# Patient Record
Sex: Female | Born: 1955
Health system: Southern US, Community
[De-identification: ages and names within clinical notes are randomized; demographics above are authoritative.]

## PROBLEM LIST (undated history)

## (undated) DIAGNOSIS — Z83719 Family history of colon polyps, unspecified: Secondary | ICD-10-CM

## (undated) DIAGNOSIS — Z860100 Personal history of colon polyps, unspecified: Secondary | ICD-10-CM

## (undated) DIAGNOSIS — J302 Other seasonal allergic rhinitis: Secondary | ICD-10-CM

## (undated) DIAGNOSIS — Z8619 Personal history of other infectious and parasitic diseases: Secondary | ICD-10-CM

## (undated) DIAGNOSIS — G43909 Migraine, unspecified, not intractable, without status migrainosus: Secondary | ICD-10-CM

## (undated) DIAGNOSIS — M109 Gout, unspecified: Secondary | ICD-10-CM

## (undated) DIAGNOSIS — Z87442 Personal history of urinary calculi: Secondary | ICD-10-CM

## (undated) DIAGNOSIS — C4490 Unspecified malignant neoplasm of skin, unspecified: Secondary | ICD-10-CM

## (undated) DIAGNOSIS — K219 Gastro-esophageal reflux disease without esophagitis: Secondary | ICD-10-CM

## (undated) DIAGNOSIS — Z853 Personal history of malignant neoplasm of breast: Secondary | ICD-10-CM

## (undated) DIAGNOSIS — Z8371 Family history of colonic polyps: Secondary | ICD-10-CM

## (undated) DIAGNOSIS — J189 Pneumonia, unspecified organism: Secondary | ICD-10-CM

## (undated) DIAGNOSIS — N390 Urinary tract infection, site not specified: Secondary | ICD-10-CM

## (undated) DIAGNOSIS — F419 Anxiety disorder, unspecified: Secondary | ICD-10-CM

## (undated) DIAGNOSIS — L719 Rosacea, unspecified: Secondary | ICD-10-CM

## (undated) DIAGNOSIS — R58 Hemorrhage, not elsewhere classified: Secondary | ICD-10-CM

## (undated) DIAGNOSIS — K279 Peptic ulcer, site unspecified, unspecified as acute or chronic, without hemorrhage or perforation: Secondary | ICD-10-CM

## (undated) DIAGNOSIS — M1990 Unspecified osteoarthritis, unspecified site: Secondary | ICD-10-CM

## (undated) DIAGNOSIS — K760 Fatty (change of) liver, not elsewhere classified: Secondary | ICD-10-CM

## (undated) DIAGNOSIS — I10 Essential (primary) hypertension: Secondary | ICD-10-CM

## (undated) DIAGNOSIS — H04129 Dry eye syndrome of unspecified lacrimal gland: Secondary | ICD-10-CM

## (undated) DIAGNOSIS — J45909 Unspecified asthma, uncomplicated: Secondary | ICD-10-CM

## (undated) DIAGNOSIS — K317 Polyp of stomach and duodenum: Secondary | ICD-10-CM

## (undated) DIAGNOSIS — Z800 Family history of malignant neoplasm of digestive organs: Secondary | ICD-10-CM

## (undated) DIAGNOSIS — K589 Irritable bowel syndrome without diarrhea: Secondary | ICD-10-CM

## (undated) DIAGNOSIS — J309 Allergic rhinitis, unspecified: Secondary | ICD-10-CM

## (undated) DIAGNOSIS — N189 Chronic kidney disease, unspecified: Secondary | ICD-10-CM

## (undated) HISTORY — PX: WISDOM TOOTH EXTRACTION: SHX21

## (undated) HISTORY — DX: Dry eye syndrome of unspecified lacrimal gland: H04.129

## (undated) HISTORY — PX: KNEE SURGERY: SHX244

## (undated) HISTORY — DX: Irritable bowel syndrome, unspecified: K58.9

## (undated) HISTORY — DX: Other seasonal allergic rhinitis: J30.2

## (undated) HISTORY — DX: Hemorrhage, not elsewhere classified: R58

## (undated) HISTORY — DX: Urinary tract infection, site not specified: N39.0

## (undated) HISTORY — DX: Family history of malignant neoplasm of digestive organs: Z80.0

## (undated) HISTORY — PX: CHOLECYSTECTOMY: SHX55

## (undated) HISTORY — DX: Personal history of urinary calculi: Z87.442

## (undated) HISTORY — DX: Family history of colon polyps, unspecified: Z83.719

## (undated) HISTORY — DX: Unspecified asthma, uncomplicated: J45.909

## (undated) HISTORY — DX: Gastro-esophageal reflux disease without esophagitis: K21.9

## (undated) HISTORY — DX: Personal history of other infectious and parasitic diseases: Z86.19

## (undated) HISTORY — DX: Unspecified malignant neoplasm of skin, unspecified: C44.90

## (undated) HISTORY — DX: Chronic kidney disease, unspecified: N18.9

## (undated) HISTORY — DX: Gout, unspecified: M10.9

## (undated) HISTORY — DX: Peptic ulcer, site unspecified, unspecified as acute or chronic, without hemorrhage or perforation: K27.9

## (undated) HISTORY — DX: Rosacea, unspecified: L71.9

## (undated) HISTORY — PX: CATARACT EXTRACTION: SUR2

## (undated) HISTORY — DX: Personal history of colonic polyps: Z86.010

## (undated) HISTORY — DX: Personal history of malignant neoplasm of breast: Z85.3

## (undated) HISTORY — DX: Anxiety disorder, unspecified: F41.9

## (undated) HISTORY — DX: Migraine, unspecified, not intractable, without status migrainosus: G43.909

## (undated) HISTORY — DX: Essential (primary) hypertension: I10

## (undated) HISTORY — DX: Pneumonia, unspecified organism: J18.9

## (undated) HISTORY — DX: Family history of colonic polyps: Z83.71

## (undated) HISTORY — DX: Allergic rhinitis, unspecified: J30.9

## (undated) HISTORY — PX: ABDOMINAL HYSTERECTOMY: SUR658

## (undated) HISTORY — DX: Polyp of stomach and duodenum: K31.7

## (undated) HISTORY — DX: Personal history of colon polyps, unspecified: Z86.0100

## (undated) HISTORY — DX: Unspecified osteoarthritis, unspecified site: M19.90

## (undated) HISTORY — DX: Fatty (change of) liver, not elsewhere classified: K76.0

---

## 1974-02-04 HISTORY — PX: NOSE SURGERY: SHX723

## 2008-02-05 DIAGNOSIS — C50919 Malignant neoplasm of unspecified site of unspecified female breast: Secondary | ICD-10-CM

## 2008-02-05 HISTORY — DX: Malignant neoplasm of unspecified site of unspecified female breast: C50.919

## 2009-01-30 HISTORY — PX: BREAST LUMPECTOMY: SHX2

## 2009-12-04 HISTORY — PX: LAPAROSCOPIC HYSTERECTOMY: SHX1926

## 2010-02-04 HISTORY — PX: COLONOSCOPY: SHX174

## 2012-03-27 DIAGNOSIS — C50919 Malignant neoplasm of unspecified site of unspecified female breast: Secondary | ICD-10-CM | POA: Insufficient documentation

## 2012-03-27 DIAGNOSIS — K219 Gastro-esophageal reflux disease without esophagitis: Secondary | ICD-10-CM | POA: Insufficient documentation

## 2012-11-11 DIAGNOSIS — M109 Gout, unspecified: Secondary | ICD-10-CM | POA: Insufficient documentation

## 2012-11-11 DIAGNOSIS — J45909 Unspecified asthma, uncomplicated: Secondary | ICD-10-CM | POA: Insufficient documentation

## 2012-11-11 DIAGNOSIS — R002 Palpitations: Secondary | ICD-10-CM | POA: Insufficient documentation

## 2013-05-10 HISTORY — PX: ESOPHAGOGASTRODUODENOSCOPY: SHX1529

## 2014-01-18 DIAGNOSIS — R112 Nausea with vomiting, unspecified: Secondary | ICD-10-CM | POA: Insufficient documentation

## 2014-01-18 DIAGNOSIS — Z9889 Other specified postprocedural states: Secondary | ICD-10-CM | POA: Insufficient documentation

## 2014-07-14 DIAGNOSIS — R079 Chest pain, unspecified: Secondary | ICD-10-CM | POA: Insufficient documentation

## 2014-10-08 DIAGNOSIS — Z8709 Personal history of other diseases of the respiratory system: Secondary | ICD-10-CM

## 2014-10-08 DIAGNOSIS — J449 Chronic obstructive pulmonary disease, unspecified: Secondary | ICD-10-CM | POA: Insufficient documentation

## 2014-10-08 DIAGNOSIS — I10 Essential (primary) hypertension: Secondary | ICD-10-CM

## 2014-10-08 DIAGNOSIS — Z853 Personal history of malignant neoplasm of breast: Secondary | ICD-10-CM | POA: Insufficient documentation

## 2014-10-08 DIAGNOSIS — Z8719 Personal history of other diseases of the digestive system: Secondary | ICD-10-CM | POA: Insufficient documentation

## 2015-01-16 ENCOUNTER — Ambulatory Visit: Payer: Self-pay | Admitting: Allergy and Immunology

## 2015-01-20 ENCOUNTER — Encounter: Payer: Self-pay | Admitting: Allergy and Immunology

## 2015-01-20 ENCOUNTER — Ambulatory Visit (INDEPENDENT_AMBULATORY_CARE_PROVIDER_SITE_OTHER): Payer: 59 | Admitting: Allergy and Immunology

## 2015-01-20 VITALS — BP 124/96 | HR 88 | Resp 16

## 2015-01-20 DIAGNOSIS — Z8709 Personal history of other diseases of the respiratory system: Secondary | ICD-10-CM

## 2015-01-20 DIAGNOSIS — J449 Chronic obstructive pulmonary disease, unspecified: Secondary | ICD-10-CM

## 2015-01-20 DIAGNOSIS — J45909 Unspecified asthma, uncomplicated: Secondary | ICD-10-CM

## 2015-01-20 DIAGNOSIS — Z8719 Personal history of other diseases of the digestive system: Secondary | ICD-10-CM | POA: Diagnosis not present

## 2015-01-20 MED ORDER — MONTELUKAST SODIUM 10 MG PO TABS: 10.0000 mg | ORAL_TABLET | Freq: Every day | ORAL | Status: DC

## 2015-01-20 NOTE — Progress Notes (Signed)
[facility]  Follow-up Note  Refering Provider: Ernestene Kiel, MD Primary Provider: Ernestene Kiel, MD  Subjective:   Autumn Carter is a 59 y.o. female who returns to the [facility] [place] in re-evaluation of the following:  HPI Comments:  Autumn Carter presents to this clinic on 01/20/2015 in evaluation of her respiratory tract disease. Overall she's done relatively well since I last seen her in July. She has not had an exacerbation of her asthma requiring her to receive a systemic steroid. She continues to use Asmanex 220 one inhalation twice a day and rarely uses a short acting bronchodilator. She doesn't exercise at all and thus it's difficult to tell whether or not she has a decreased exercise capacity. She was apparently hospitalized in November for chest pain evaluation which was negative. She had a CT scan of her chest which identified a hiatal hernia. No other significant abnormality of her respiratory tract be defined with that study. Her nose was doing relatively well on Rhinocort one spray shot also once a day and her reflux was doing very good on Dexilant 60 mg one tablet once a day but occasionally 1 tablet every other day.  Yesterday she took a dose of nifedipine as treatment for her systemic arterial hypertension and developed cramping of her abdomen, bed reflux, eye irritation, and headache. She will not be taking another nifedipine at this point. She does have significant hypertension and is on several medications for this condition at this point in time and has had lots of different side effects from medications administered in the past.   Current Outpatient Prescriptions on File Prior to Visit  Medication Sig Dispense Refill  . Albuterol Sulfate (PROAIR HFA IN) Inhale into the lungs as needed.    Marland Kitchen dexlansoprazole (DEXILANT) 60 MG capsule Take 60 mg by mouth daily.    . diazepam (VALIUM) 5 MG tablet Take 5 mg by  mouth every 6 (six) hours as needed for anxiety.    . diclofenac sodium (VOLTAREN) 1 % GEL Apply topically 4 (four) times daily as needed.    Marland Kitchen losartan (COZAAR) 50 MG tablet Take 50 mg by mouth daily.    . mometasone (ASMANEX 120 METERED DOSES) 220 MCG/INH inhaler Inhale 1 puff into the lungs daily.    . montelukast (SINGULAIR) 10 MG tablet Take 10 mg by mouth at bedtime.    . potassium chloride (KLOR-CON M10) 10 MEQ tablet Take 10 mEq by mouth 2 (two) times daily.    . Probiotic Product (PROBIOTIC PO) Take by mouth daily.    Marland Kitchen tiZANidine (ZANAFLEX) 2 MG tablet Take 2 mg by mouth every 8 (eight) hours as needed for muscle spasms.    . traMADol (ULTRAM) 50 MG tablet Take 50 mg by mouth every 6 (six) hours as needed.    . indapamide (LOZOL) 1.25 MG tablet Take 1.25 mg by mouth daily. Reported on 01/20/2015     No current facility-administered medications on file prior to visit.    No orders of the defined types were placed in this encounter.    Past Medical History  Diagnosis Date  . Asthma     History reviewed. No pertinent past surgical history.  Allergies  Allergen Reactions  . Ace Inhibitors Swelling  . Amoxicillin   . Benicar Hct [Olmesartan Medoxomil-Hctz]   . Cephalexin   . Codeine Sulfate   . Hydralazine Hcl   . Hydrochlorothiazide   . Lansoprazole   .  Levaquin [Levofloxacin In D5w]   . Macrobid [employer] Macro]   . Metoprolol Tartrate   . Morphine And Related Other (See Comments)    hallucinations    . Nasonex [Mometasone Furoate]   . Ranitidine Itching and Swelling  . Sulfa Antibiotics   . Zestril [Lisinopril]     Review of Systems  Constitutional: Negative.   HENT: Positive for congestion.   Eyes: Positive for itching.  Respiratory: Negative.   Cardiovascular: Negative.   Gastrointestinal: Negative.   Musculoskeletal: Positive for arthralgias (Knee arthritis).  Skin: Negative.   Hematological: Negative.      Objective:   Filed  Vitals:   01/20/15 1114  BP: 124/96  Pulse: 88  Resp: 16          Physical Exam  Constitutional: She appears well-developed and well-nourished. No distress.  HENT:  Head: Normocephalic and atraumatic. Head is without right periorbital erythema and without left periorbital erythema.  Right Ear: Tympanic membrane, external ear and ear canal normal. No drainage or tenderness. No foreign bodies. Tympanic membrane is not injected, not scarred, not perforated, not erythematous, not retracted and not bulging. No middle ear effusion.  Left Ear: Tympanic membrane, external ear and ear canal normal. No drainage or tenderness. No foreign bodies. Tympanic membrane is not injected, not scarred, not perforated, not erythematous, not retracted and not bulging.  No middle ear effusion.  Nose: Nose normal. No mucosal edema, rhinorrhea, nose lacerations or sinus tenderness.  No foreign bodies.  Mouth/Throat: Oropharynx is clear and moist. No oropharyngeal exudate, posterior oropharyngeal edema, posterior oropharyngeal erythema or tonsillar abscesses.  Eyes: Lids are normal. Right eye exhibits no chemosis, no discharge and no exudate. No foreign body present in the right eye. Left eye exhibits no chemosis, no discharge and no exudate. No foreign body present in the left eye. Right conjunctiva is not injected. Left conjunctiva is not injected.  Neck: Neck supple. No tracheal tenderness present. No tracheal deviation and no edema present. No thyroid mass and no thyromegaly present.  Cardiovascular: Normal rate, regular rhythm, S1 normal and S2 normal.  Exam reveals no gallop.   No murmur heard. Pulmonary/Chest: No accessory muscle usage or stridor. No respiratory distress. She has no wheezes. She has no rhonchi. She has no rales.  Abdominal: Soft.  Lymphadenopathy:       Head (right side): No tonsillar adenopathy present.       Head (left side): No tonsillar adenopathy present.    She has no cervical  adenopathy.  Neurological: She is alert.  Skin: No rash noted. She is not diaphoretic.  Psychiatric: She has a normal mood and affect. Her behavior is normal.    Diagnostics:    Spirometry was performed and demonstrated an FEV1 of 1.39 at 60 % of predicted.  The patient had an Asthma Control Test with the following results: ACT Total Score: 15.    Assessment and Plan:   1. COPD with asthma ([place])   2. History of allergic rhinitis   3. History of gastroesophageal reflux (GERD)      1. Treat inflammation:   A. Asmanex 220 2 inhalations one time per day  B. OTC Rhinocort one spray each nostril one time per day  C. montelukast 10 mg one tablet one time per day  2. Treat reflux:   A. minimize all caffeine and chocolate use  B. continue Dexilant 60 mg in the morning  C. can add ranitidine 300 mg in the evening if needed  3. Continue ProAir HFA and antihistamine if needed  4. Stop nifedipine & undergo progressive exercise routine & check blood pressure  5. Return to clinic in 6 months or earlier if problem  This is been doing relatively well and I see no need for changing her anti-inflammatory medications other than to use a combination of Asmanex and Rhinocort and montelukast on a consistent basis. I would like for her to be a little more aggressive about treating her reflux especially with her recent chest pain admission. I would like for her to minimize off her caffeine and chocolate and consistently use Dexilant every day and she can add in ranitidine if she needs to. Regarding her systemic control hypertension she has had less than optimal control of this condition with various medications and a lot of attention to this problem. I long talk with her today about the need to undergo her progressive exercise routine where she is somewhat out of breath for about 30 minutes at least 3 times per week. This should actually lower her blood pressure. She does have a bad knee and I've  informed her that she can use the exercise machines and just focus on her upper body and his long as she works quickly from machine to machine she'll probably get her heart rate up in get benefit from this activity. I'll see her back in this clinic in 6 months or earlier if there is a problem.   Allena Katz, MD [place]

## 2015-01-20 NOTE — Patient Instructions (Signed)
  1. Treat inflammation:   A. Asmanex 220 2 inhalations one time per day  B. OTC Rhinocort one spray each nostril one time per day  C. montelukast 10 mg one tablet one time per day  2. Treat reflux:   A. minimize all caffeine and chocolate use  B. continue Dexilant 60 mg in the morning  C. can add ranitidine 300 mg in the evening if needed  3. Continue ProAir HFA and antihistamine if needed  4. Stop nifedipine & undergo progressive exercise routine & check blood pressure  5. Return to clinic in 6 months or earlier if problem

## 2015-02-22 ENCOUNTER — Ambulatory Visit: Payer: 59 | Admitting: Allergy and Immunology

## 2015-06-14 ENCOUNTER — Other Ambulatory Visit: Payer: Self-pay | Admitting: Allergy and Immunology

## 2015-06-29 ENCOUNTER — Other Ambulatory Visit: Payer: Self-pay

## 2015-06-29 MED ORDER — FLUTICASONE FUROATE 200 MCG/ACT IN AEPB: 1.0000 | INHALATION_SPRAY | Freq: Every day | RESPIRATORY_TRACT | Status: DC

## 2015-06-29 NOTE — Telephone Encounter (Signed)
Asmanex changed to Arnuity 200 due to insurance per Dr. Neldon Mc. Pt made aware and RX sent.

## 2015-07-20 ENCOUNTER — Ambulatory Visit (INDEPENDENT_AMBULATORY_CARE_PROVIDER_SITE_OTHER): Payer: 59 | Admitting: Allergy and Immunology

## 2015-07-20 ENCOUNTER — Encounter: Payer: Self-pay | Admitting: Allergy and Immunology

## 2015-07-20 VITALS — BP 130/80 | HR 78 | Resp 18

## 2015-07-20 DIAGNOSIS — Z8709 Personal history of other diseases of the respiratory system: Secondary | ICD-10-CM

## 2015-07-20 DIAGNOSIS — J4489 Other specified chronic obstructive pulmonary disease: Secondary | ICD-10-CM

## 2015-07-20 DIAGNOSIS — J449 Chronic obstructive pulmonary disease, unspecified: Secondary | ICD-10-CM

## 2015-07-20 DIAGNOSIS — Z8719 Personal history of other diseases of the digestive system: Secondary | ICD-10-CM | POA: Diagnosis not present

## 2015-07-20 DIAGNOSIS — J45909 Unspecified asthma, uncomplicated: Secondary | ICD-10-CM

## 2015-07-20 NOTE — Patient Instructions (Signed)
  1. Treat inflammation:   A. Arnuity 200 1 inhalations one time per day  B. OTC Rhinocort one spray each nostril one time per day  C. montelukast 10 mg one tablet one time per day  2. Treat reflux:   A. Continue to minimize all caffeine and chocolate use  B. continue Dexilant 60 mg in the morning  C. can add ranitidine 300 mg in the evening if needed  3. Continue ProAir HFA and antihistamine if needed  4. Return to clinic in 6 months or earlier if problem

## 2015-07-20 NOTE — Progress Notes (Signed)
Follow-up Note  Referring Provider: Ernestene Kiel, MD Primary Provider: Ernestene Kiel, MD Date of Office Visit: 07/20/2015  Subjective:   Autumn Carter (DOB: 1955-06-25) is a 60 y.o. female who returns to the [facility] and [place] on 07/20/2015 in re-evaluation of the following:  HPI: Autumn Carter returns to this clinic in reevaluation of her COPD with asthma, allergic rhinitis, and reflux-induced respiratory disease. I've not seen her in his clinic since December 2016.  During the interval she has not required a systemic steroid to treat her asthma. She does not use a short acting bronchodilator and she can exert herself without any difficulty. She has been using Asmanex on a consistent basis but because of an insurance issue she will be starting a different form of inhaled steroid.  She has not required an antibiotic to treat an episode of sinusitis and for the most part she thinks her nose is doing relatively well on her Rhinocort and montelukast. She did have some irritation of her left eye recently and she is using Pataday with success.  Her reflux is doing much better while using her Dexilant and completely eliminating all caffeine and chocolate consumption.    Medication List           ASMANEX 120 METERED DOSES 220 MCG/INH inhaler  Generic drug:  mometasone  USE 2 INHALATIONS ONCE DAILY TO PREVENT COUGH OR WHEEZE.INCREASE TO 2 INHALATIONS 2X A DAY WITH FLAR     DEXILANT 60 MG capsule  Generic drug:  dexlansoprazole  Take 60 mg by mouth daily.     diazepam 5 MG tablet  Commonly known as:  VALIUM  Take 5 mg by mouth every 6 (six) hours as needed for anxiety.     diclofenac sodium 1 % Gel  Commonly known as:  VOLTAREN  Apply topically 4 (four) times daily as needed.     indapamide 1.25 MG tablet  Commonly known as:  LOZOL  Take 1.25 mg by mouth daily. Reported on 01/20/2015     KLOR-CON M10 10 MEQ tablet  Generic drug:  potassium chloride  Take 10 mEq by mouth  2 (two) times daily.     losartan 50 MG tablet  Commonly known as:  COZAAR  Take 50 mg by mouth daily.     montelukast 10 MG tablet  Commonly known as:  SINGULAIR  Take 1 tablet (10 mg total) by mouth daily.     PATADAY 0.2 % Soln  Generic drug:  Olopatadine HCl  INSTILL 1 DROP EVERY DAY INTO BOTH EYES     PROAIR HFA IN  Inhale into the lungs as needed.     PROBIOTIC PO  Take by mouth daily.     RHINOCORT ALLERGY 32 MCG/ACT nasal spray  Generic drug:  budesonide  Place 1 spray into both nostrils daily.     tiZANidine 2 MG tablet  Commonly known as:  ZANAFLEX  Take 2 mg by mouth every 8 (eight) hours as needed for muscle spasms.     traMADol 50 MG tablet  Commonly known as:  ULTRAM  Take 50 mg by mouth every 6 (six) hours as needed.        Past Medical History  Diagnosis Date  . Asthma     History reviewed. No pertinent past surgical history.  Allergies  Allergen Reactions  . Ace Inhibitors Swelling  . Amoxicillin   . Benicar Hct [Olmesartan Medoxomil-Hctz]   . Cephalexin   . Clindamycin/Lincomycin   . Codeine Sulfate   .  Doxazosin Other (See Comments)    pain  . Hydralazine Hcl   . Hydrochlorothiazide   . Lansoprazole   . Levaquin [Levofloxacin In D5w]   . Macrobid [employer] Macro]   . Metoprolol Tartrate   . Morphine And Related Other (See Comments)    hallucinations    . Nasonex [Mometasone Furoate]   . Ranitidine Itching and Swelling  . Sulfa Antibiotics   . Zestril [Lisinopril]     Review of systems negative except as noted in HPI / PMHx or noted below:  Review of Systems  Constitutional: Negative.   HENT: Negative.   Eyes: Negative.   Respiratory: Negative.   Cardiovascular: Negative.   Gastrointestinal: Negative.   Genitourinary: Negative.   Musculoskeletal: Negative.   Skin: Negative.   Neurological: Negative.   Endo/Heme/Allergies: Negative.   Psychiatric/Behavioral: Negative.      Objective:   Filed Vitals:     07/20/15 1113  BP: 130/80  Pulse: 78  Resp: 18          Physical Exam  Constitutional: She is well-developed, well-nourished, and in no distress.  HENT:  Head: Normocephalic.  Right Ear: Tympanic membrane, external ear and ear canal normal.  Left Ear: Tympanic membrane, external ear and ear canal normal.  Nose: Nose normal. No mucosal edema or rhinorrhea.  Mouth/Throat: Uvula is midline, oropharynx is clear and moist and mucous membranes are normal. No oropharyngeal exudate.  Eyes: Conjunctivae are normal.  Neck: Trachea normal. No tracheal tenderness present. No tracheal deviation present. No thyromegaly present.  Cardiovascular: Normal rate, regular rhythm, S1 normal, S2 normal and normal heart sounds.   No murmur heard. Pulmonary/Chest: Breath sounds normal. No stridor. No respiratory distress. She has no wheezes. She has no rales.  Musculoskeletal: She exhibits no edema.  Lymphadenopathy:       Head (right side): No tonsillar adenopathy present.       Head (left side): No tonsillar adenopathy present.    She has no cervical adenopathy.  Neurological: She is alert. Gait normal.  Skin: No rash noted. She is not diaphoretic. No erythema. Nails show no clubbing.  Psychiatric: Mood and affect normal.    Diagnostics:    Spirometry was performed and demonstrated an FEV1 of 1.43 at 62 % of predicted.  The patient had an Asthma Control Test with the following results: ACT Total Score: 14.    Assessment and Plan:   1. COPD with asthma ([place])   2. History of allergic rhinitis   3. History of gastroesophageal reflux (GERD)     1. Treat inflammation:   A. Arnuity 200 1 inhalations one time per day  B. OTC Rhinocort one spray each nostril one time per day  C. montelukast 10 mg one tablet one time per day  2. Treat reflux:   A. Continue to minimize all caffeine and chocolate use  B. continue Dexilant 60 mg in the morning  C. can add ranitidine 300 mg in the evening if  needed  3. Continue ProAir HFA and antihistamine if needed  4. Return to clinic in 6 months or earlier if problem  Allayah appears to be doing relatively well at this point in time regarding her asthma and her allergic rhinitis and reflux and I see very little need for changing her medical therapy other than the fact that her insurance company will be making [place] change her Asmanex to [place]. I've encouraged her to get a flu vaccine and the Pneumovax but because of her history  of developing reactions to various medication and vaccines she is very hesitant to do so. I will see her back in this clinic in approximately 6 months or earlier if there is a problem.  Allena Katz, MD [place]

## 2015-07-21 ENCOUNTER — Ambulatory Visit: Payer: 59 | Admitting: Allergy and Immunology

## 2015-08-10 DIAGNOSIS — Z86000 Personal history of in-situ neoplasm of breast: Secondary | ICD-10-CM | POA: Diagnosis not present

## 2015-11-16 ENCOUNTER — Other Ambulatory Visit: Payer: Self-pay | Admitting: *Deleted

## 2015-11-16 MED ORDER — FLUTICASONE FUROATE 200 MCG/ACT IN AEPB: 1.0000 | INHALATION_SPRAY | Freq: Every day | RESPIRATORY_TRACT | 0 refills | Status: DC

## 2015-11-22 ENCOUNTER — Other Ambulatory Visit: Payer: Self-pay | Admitting: Allergy and Immunology

## 2015-11-22 MED ORDER — FLUTICASONE FUROATE 200 MCG/ACT IN AEPB: 1.0000 | INHALATION_SPRAY | Freq: Every day | RESPIRATORY_TRACT | 0 refills | Status: DC

## 2015-11-22 NOTE — Telephone Encounter (Signed)
Pharmacy is stating they did not get the RX for a 90 day supply.  Please resend RX.

## 2015-11-22 NOTE — Telephone Encounter (Signed)
Resent rx

## 2015-12-01 HISTORY — PX: COLONOSCOPY: SHX174

## 2016-01-19 ENCOUNTER — Ambulatory Visit (INDEPENDENT_AMBULATORY_CARE_PROVIDER_SITE_OTHER): Payer: 59 | Admitting: Allergy and Immunology

## 2016-01-19 ENCOUNTER — Encounter: Payer: Self-pay | Admitting: Allergy and Immunology

## 2016-01-19 VITALS — BP 150/102 | HR 80 | Resp 16

## 2016-01-19 DIAGNOSIS — K219 Gastro-esophageal reflux disease without esophagitis: Secondary | ICD-10-CM

## 2016-01-19 DIAGNOSIS — J3089 Other allergic rhinitis: Secondary | ICD-10-CM | POA: Diagnosis not present

## 2016-01-19 DIAGNOSIS — J449 Chronic obstructive pulmonary disease, unspecified: Secondary | ICD-10-CM

## 2016-01-19 NOTE — Patient Instructions (Signed)
  1. Continue to Treat inflammation:   A. Arnuity 200 1 inhalations one time per day  B. OTC Rhinocort one spray each nostril one time per day  2. Continue to Treat reflux:   A. Continue to minimize all caffeine and chocolate use  B. continue Dexilant 60 mg in the morning  3. Continue ProAir HFA and antihistamine if needed  4. Return to clinic in 6 months or earlier if problem

## 2016-01-19 NOTE — Progress Notes (Signed)
Follow-up Note  Referring Provider: Ernestene Kiel, MD Primary Provider: Ernestene Kiel, MD Date of Office Visit: 01/19/2016  Subjective:   Autumn Carter (DOB: 1955-07-24) is a 60 y.o. female who returns to the [facility] [place] on 01/19/2016 in re-evaluation of the following:  HPI: Autumn Carter returns to this clinic in reevaluation of her obstructive lung disease with component of asthma, allergic rhinitis, and reflux-induced respiratory disease. I last saw her in his clinic in June 2017.  She has done quite well regarding her respiratory tract. She does not use a short acting bronchodilator greater than once a week and has not required a systemic steroid to treat an exacerbation of her asthma. It does not sound as though she has required an antibiotic to treat an episode of sinusitis. She is presently using Arnuity and Rhinocort but she stop montelukast because she thinks that this agent contributed to her depression.  Her reflux and her throat is doing quite well at this point time while using Dexilant. She is not using any ranitidine. She is not consuming any caffeine or chocolate.  She refuses to receive the flu vaccine. She will be getting a Prevnar vaccination sometime in the next month.    Medication List      DEXILANT 60 MG capsule Generic drug:  dexlansoprazole Take 60 mg by mouth daily.   diazepam 5 MG tablet Commonly known as:  VALIUM Take 5 mg by mouth every 6 (six) hours as needed for anxiety.   diclofenac sodium 1 % Gel Commonly known as:  VOLTAREN Apply topically 4 (four) times daily as needed.   Fluticasone Furoate 200 MCG/ACT Aepb Commonly known as:  ARNUITY ELLIPTA Inhale 1 puff into the lungs daily.   indapamide 1.25 MG tablet Commonly known as:  LOZOL Take 1.25 mg by mouth daily. Reported on 01/20/2015   KLOR-CON M10 10 MEQ tablet Generic drug:  potassium chloride Take 10 mEq by mouth 2 (two) times daily.   losartan 50 MG tablet Commonly  known as:  COZAAR Take 50 mg by mouth daily.   montelukast 10 MG tablet Commonly known as:  SINGULAIR Take 1 tablet (10 mg total) by mouth daily.   PATADAY 0.2 % Soln Generic drug:  Olopatadine HCl INSTILL 1 DROP EVERY DAY INTO BOTH EYES   PROAIR HFA IN Inhale into the lungs as needed.   PROBIOTIC PO Take by mouth daily.   RHINOCORT ALLERGY 32 MCG/ACT nasal spray Generic drug:  budesonide Place 1 spray into both nostrils daily.   tiZANidine 2 MG tablet Commonly known as:  ZANAFLEX Take 2 mg by mouth every 8 (eight) hours as needed for muscle spasms.   traMADol 50 MG tablet Commonly known as:  ULTRAM Take 50 mg by mouth every 6 (six) hours as needed.       Past Medical History:  Diagnosis Date  . Asthma     No past surgical history on file.  Allergies  Allergen Reactions  . Ace Inhibitors Swelling  . Amoxicillin   . Benicar Hct [Olmesartan Medoxomil-Hctz]   . Cephalexin   . Clindamycin/Lincomycin   . Codeine Sulfate   . Doxazosin Other (See Comments)    pain  . Hydralazine Hcl   . Hydrochlorothiazide   . Lansoprazole   . Levaquin [Levofloxacin In D5w]   . Macrobid [employer] Macro]   . Metoprolol Tartrate   . Morphine And Related Other (See Comments)    hallucinations    . Nasonex [Mometasone Furoate]   .  Nifedipine Other (See Comments)    Headache   . Ranitidine Itching and Swelling  . Singulair [Montelukast Sodium] Other (See Comments)    heartburn  . Sulfa Antibiotics   . Zestril [Lisinopril]     Review of systems negative except as noted in HPI / PMHx or noted below:  Review of Systems  Constitutional: Negative.   HENT: Negative.   Eyes: Negative.   Respiratory: Negative.   Cardiovascular: Negative.   Gastrointestinal: Negative.   Genitourinary: Negative.   Musculoskeletal: Negative.   Skin: Negative.   Neurological: Negative.   Endo/Heme/Allergies: Negative.   Psychiatric/Behavioral: Negative.      Objective:    Vitals:   01/19/16 1102  BP: (!) 150/102  Pulse: 80  Resp: 16          Physical Exam  Constitutional: She is well-developed, well-nourished, and in no distress.  HENT:  Head: Normocephalic.  Right Ear: Tympanic membrane, external ear and ear canal normal.  Left Ear: Tympanic membrane, external ear and ear canal normal.  Nose: Nose normal. No mucosal edema or rhinorrhea.  Mouth/Throat: Uvula is midline, oropharynx is clear and moist and mucous membranes are normal. No oropharyngeal exudate.  Eyes: Conjunctivae are normal.  Neck: Trachea normal. No tracheal tenderness present. No tracheal deviation present. No thyromegaly present.  Cardiovascular: Normal rate, regular rhythm, S1 normal, S2 normal and normal heart sounds.   No murmur heard. Pulmonary/Chest: Breath sounds normal. No stridor. No respiratory distress. She has no wheezes. She has no rales.  Musculoskeletal: She exhibits no edema.  Lymphadenopathy:       Head (right side): No tonsillar adenopathy present.       Head (left side): No tonsillar adenopathy present.    She has no cervical adenopathy.  Neurological: She is alert. Gait normal.  Skin: No rash noted. She is not diaphoretic. No erythema. Nails show no clubbing.  Psychiatric: Mood and affect normal.    Diagnostics:    Spirometry was performed and demonstrated an FEV1 of 1.18 at 51 % of predicted.  The patient had an Asthma Control Test with the following results: ACT Total Score: 13.    Assessment and Plan:   1. COPD with asthma ([place])   2. Other allergic rhinitis   3. Gastroesophageal reflux disease, esophagitis presence not specified     1. Continue to Treat inflammation:   A. Arnuity 200 1 inhalations one time per day  B. OTC Rhinocort one spray each nostril one time per day  2. Continue to Treat reflux:   A. Continue to minimize all caffeine and chocolate use  B. continue Dexilant 60 mg in the morning  3. Continue ProAir HFA and  antihistamine if needed  4. Return to clinic in 6 months or earlier if problem  [name] is doing quite well on her current plan and I see very little need for changing this plan at this point. She'll continue to use anti-inflammatory agents for her respiratory tract and continue to treat her reflux as noted above and we'll make the assumption she'll continue to do well and see her back in this clinic in 6 months or earlier if there is a problem. I did have a long talk with her today about receiving the flu vaccine for if she contracts influenza I think it will be a rather significant event. However, I was unsuccessful in convincing her to receive the flu vaccine.  Allena Katz, MD [place]

## 2016-01-25 ENCOUNTER — Encounter: Payer: Self-pay | Admitting: Allergy and Immunology

## 2016-02-13 DIAGNOSIS — M542 Cervicalgia: Secondary | ICD-10-CM | POA: Diagnosis not present

## 2016-02-13 DIAGNOSIS — M545 Low back pain: Secondary | ICD-10-CM | POA: Diagnosis not present

## 2016-02-13 DIAGNOSIS — M546 Pain in thoracic spine: Secondary | ICD-10-CM | POA: Diagnosis not present

## 2016-02-13 DIAGNOSIS — M25562 Pain in left knee: Secondary | ICD-10-CM | POA: Diagnosis not present

## 2016-02-25 ENCOUNTER — Other Ambulatory Visit: Payer: Self-pay | Admitting: Allergy and Immunology

## 2016-02-28 DIAGNOSIS — Z1231 Encounter for screening mammogram for malignant neoplasm of breast: Secondary | ICD-10-CM | POA: Diagnosis not present

## 2016-03-07 DIAGNOSIS — Z79899 Other long term (current) drug therapy: Secondary | ICD-10-CM | POA: Diagnosis not present

## 2016-03-07 DIAGNOSIS — J45909 Unspecified asthma, uncomplicated: Secondary | ICD-10-CM | POA: Diagnosis not present

## 2016-03-07 DIAGNOSIS — R109 Unspecified abdominal pain: Secondary | ICD-10-CM | POA: Diagnosis not present

## 2016-03-07 DIAGNOSIS — E559 Vitamin D deficiency, unspecified: Secondary | ICD-10-CM | POA: Diagnosis not present

## 2016-03-07 DIAGNOSIS — H04129 Dry eye syndrome of unspecified lacrimal gland: Secondary | ICD-10-CM | POA: Diagnosis not present

## 2016-03-22 DIAGNOSIS — M25562 Pain in left knee: Secondary | ICD-10-CM | POA: Diagnosis not present

## 2016-03-22 DIAGNOSIS — M62838 Other muscle spasm: Secondary | ICD-10-CM | POA: Diagnosis not present

## 2016-03-22 DIAGNOSIS — M542 Cervicalgia: Secondary | ICD-10-CM | POA: Diagnosis not present

## 2016-03-22 DIAGNOSIS — M5432 Sciatica, left side: Secondary | ICD-10-CM | POA: Diagnosis not present

## 2016-04-04 DIAGNOSIS — M62838 Other muscle spasm: Secondary | ICD-10-CM | POA: Diagnosis not present

## 2016-04-04 DIAGNOSIS — M25562 Pain in left knee: Secondary | ICD-10-CM | POA: Diagnosis not present

## 2016-04-04 DIAGNOSIS — M5432 Sciatica, left side: Secondary | ICD-10-CM | POA: Diagnosis not present

## 2016-04-04 DIAGNOSIS — M542 Cervicalgia: Secondary | ICD-10-CM | POA: Diagnosis not present

## 2016-04-10 DIAGNOSIS — L0232 Furuncle of buttock: Secondary | ICD-10-CM | POA: Diagnosis not present

## 2016-04-25 DIAGNOSIS — R062 Wheezing: Secondary | ICD-10-CM | POA: Diagnosis not present

## 2016-04-25 DIAGNOSIS — J209 Acute bronchitis, unspecified: Secondary | ICD-10-CM | POA: Diagnosis not present

## 2016-04-29 DIAGNOSIS — J4531 Mild persistent asthma with (acute) exacerbation: Secondary | ICD-10-CM | POA: Diagnosis not present

## 2016-04-30 DIAGNOSIS — J4531 Mild persistent asthma with (acute) exacerbation: Secondary | ICD-10-CM | POA: Diagnosis not present

## 2016-05-31 DIAGNOSIS — J4531 Mild persistent asthma with (acute) exacerbation: Secondary | ICD-10-CM | POA: Diagnosis not present

## 2016-06-05 DIAGNOSIS — M545 Low back pain: Secondary | ICD-10-CM | POA: Diagnosis not present

## 2016-06-05 DIAGNOSIS — M47816 Spondylosis without myelopathy or radiculopathy, lumbar region: Secondary | ICD-10-CM | POA: Diagnosis not present

## 2016-06-05 DIAGNOSIS — M5413 Radiculopathy, cervicothoracic region: Secondary | ICD-10-CM | POA: Diagnosis not present

## 2016-06-05 DIAGNOSIS — K219 Gastro-esophageal reflux disease without esophagitis: Secondary | ICD-10-CM | POA: Diagnosis not present

## 2016-06-05 DIAGNOSIS — M62838 Other muscle spasm: Secondary | ICD-10-CM | POA: Diagnosis not present

## 2016-06-05 DIAGNOSIS — I10 Essential (primary) hypertension: Secondary | ICD-10-CM | POA: Diagnosis not present

## 2016-06-05 DIAGNOSIS — F419 Anxiety disorder, unspecified: Secondary | ICD-10-CM | POA: Diagnosis not present

## 2016-06-05 DIAGNOSIS — M546 Pain in thoracic spine: Secondary | ICD-10-CM | POA: Diagnosis not present

## 2016-06-11 DIAGNOSIS — K219 Gastro-esophageal reflux disease without esophagitis: Secondary | ICD-10-CM | POA: Diagnosis not present

## 2016-06-11 DIAGNOSIS — K582 Mixed irritable bowel syndrome: Secondary | ICD-10-CM | POA: Diagnosis not present

## 2016-06-30 DIAGNOSIS — J4531 Mild persistent asthma with (acute) exacerbation: Secondary | ICD-10-CM | POA: Diagnosis not present

## 2016-07-15 DIAGNOSIS — R079 Chest pain, unspecified: Secondary | ICD-10-CM | POA: Diagnosis not present

## 2016-07-15 DIAGNOSIS — R233 Spontaneous ecchymoses: Secondary | ICD-10-CM | POA: Diagnosis not present

## 2016-07-15 DIAGNOSIS — I10 Essential (primary) hypertension: Secondary | ICD-10-CM | POA: Diagnosis not present

## 2016-07-15 DIAGNOSIS — R0602 Shortness of breath: Secondary | ICD-10-CM | POA: Diagnosis not present

## 2016-07-17 DIAGNOSIS — K760 Fatty (change of) liver, not elsewhere classified: Secondary | ICD-10-CM | POA: Diagnosis not present

## 2016-07-17 DIAGNOSIS — R0602 Shortness of breath: Secondary | ICD-10-CM | POA: Diagnosis not present

## 2016-07-17 DIAGNOSIS — R079 Chest pain, unspecified: Secondary | ICD-10-CM | POA: Diagnosis not present

## 2016-07-17 DIAGNOSIS — R7989 Other specified abnormal findings of blood chemistry: Secondary | ICD-10-CM | POA: Diagnosis not present

## 2016-07-17 DIAGNOSIS — I517 Cardiomegaly: Secondary | ICD-10-CM | POA: Diagnosis not present

## 2016-07-19 ENCOUNTER — Ambulatory Visit (INDEPENDENT_AMBULATORY_CARE_PROVIDER_SITE_OTHER): Payer: BLUE CROSS/BLUE SHIELD | Admitting: Allergy and Immunology

## 2016-07-19 ENCOUNTER — Encounter: Payer: Self-pay | Admitting: Allergy and Immunology

## 2016-07-19 VITALS — BP 140/100 | HR 92 | Resp 18 | Ht 60.0 in | Wt 201.0 lb

## 2016-07-19 DIAGNOSIS — J45901 Unspecified asthma with (acute) exacerbation: Secondary | ICD-10-CM

## 2016-07-19 DIAGNOSIS — K120 Recurrent oral aphthae: Secondary | ICD-10-CM

## 2016-07-19 DIAGNOSIS — K219 Gastro-esophageal reflux disease without esophagitis: Secondary | ICD-10-CM

## 2016-07-19 DIAGNOSIS — J3089 Other allergic rhinitis: Secondary | ICD-10-CM

## 2016-07-19 DIAGNOSIS — J441 Chronic obstructive pulmonary disease with (acute) exacerbation: Secondary | ICD-10-CM

## 2016-07-19 MED ORDER — FLUCONAZOLE 150 MG PO TABS: 150.0000 mg | ORAL_TABLET | Freq: Once | ORAL | 0 refills | Status: AC

## 2016-07-19 MED ORDER — METHYLPREDNISOLONE ACETATE 80 MG/ML IJ SUSP
80.0000 mg | Freq: Once | INTRAMUSCULAR | Status: AC
  Administered 2016-07-19: 80 mg via INTRAMUSCULAR

## 2016-07-19 NOTE — Patient Instructions (Addendum)
  1. Continue to Treat inflammation:   A. Arnuity 200 1 inhalations one time per day  B. OTC Rhinocort one spray each nostril one time per day  2. Continue to Treat reflux:   A. Continue to minimize all caffeine and chocolate use  B. continue Dexilant 60 mg in the morning  3. Continue ProAir HFA and antihistamine if needed  4. Depo-Medrol 80 IM delivered in clinic today  5. Diflucan 150 mg tablet single tablet today  6. Can continue mouthwash from Dr. Laqueta Due  7. Follow through with heart exam with Dr. Baird Cancer  8. Return to clinic in 6 months or earlier if problem

## 2016-07-19 NOTE — Progress Notes (Signed)
Follow-up Note  Referring Provider: Ernestene Kiel, MD Primary Provider: Ernestene Kiel, MD Date of Office Visit: 07/19/2016  Subjective:   Autumn Carter (DOB: [birth year]) is a 61 y.o. female who returns to the [facility] on 07/19/2016 in re-evaluation of the following:  HPI: Autumn Carter returns to this clinic in reevaluation of her obstructive lung disease with component of asthma, allergic rhinitis, and reflux-induced respiratory disease. I have not seen her in this clinic since 01/19/2016.  Apparently she became "sick" in February and received a steroid shot for what sounds like respiratory tract symptoms involving both her upper and lower airway and all this completely resolved and she did well until 4 weeks ago. At that point she developed shortness of breath and significant dyspnea on exertion that was somewhat different from her usual asthma. She would use her short-acting bronchodilator and apparently this did help this issue. She was seen by her primary care doctor this past Monday for evaluation of her respiratory tract symptoms as well as left arm pain and right chest pain and left leg pain and had blood tests which were apparently normal and a chest CT angiogram which did not identify a pulmonary embolus. She apparently had a change in her medications at that point and she is somewhat better from that event but still continues to have this issue of lingering shortness of breath and dyspnea. She is scheduled to have a "heart study" which sounds as though it may be an echocardiogram sometime next week.  She also notes that she has been having sores in her mouth over the course of the past month. She has been given some type of mouthwash to use over the course the past week which has helped her somewhat but she still continues to have a sore. She has had some issues with intermittent nausea and she has had a change in her blood pressure medicines apparently to address this  issue.  Allergies as of 07/19/2016      Reactions   Ace Inhibitors Swelling   Amoxicillin    Benicar Hct [olmesartan Medoxomil-hctz]    Cephalexin    Clindamycin/lincomycin    Codeine Sulfate    Doxazosin Other (See Comments)   pain   Hydralazine Hcl    Hydrochlorothiazide    Lansoprazole    Levaquin [levofloxacin In D5w]    Macrobid [nitrofurantoin Monohyd Macro]    Metoprolol Tartrate    Morphine And Related Other (See Comments)   hallucinations     Nasonex [mometasone Furoate]    Nifedipine Other (See Comments)   Headache   Ranitidine Itching, Swelling   Singulair [montelukast Sodium] Other (See Comments)   heartburn   Sulfa Antibiotics    Zestril [lisinopril]       Medication List      ammonium lactate 12 % lotion Commonly known as:  LAC-HYDRIN Apply 1 application topically as needed for dry skin.   ARNUITY ELLIPTA 200 MCG/ACT Aepb Generic drug:  Fluticasone Furoate INHALE 1 PUFF INTO THE LUNGS DAILY.   DEXILANT 60 MG capsule Generic drug:  dexlansoprazole Take 60 mg by mouth daily.   diazepam 5 MG tablet Commonly known as:  VALIUM Take 5 mg by mouth every 6 (six) hours as needed for anxiety.   diclofenac sodium 1 % Gel Commonly known as:  VOLTAREN Apply topically 4 (four) times daily as needed.   escitalopram 5 MG tablet Commonly known as:  LEXAPRO Take 5 mg by mouth daily.   indapamide  1.25 MG tablet Commonly known as:  LOZOL Take 1.25 mg by mouth daily. Reported on 01/20/2015   KLOR-CON M10 10 MEQ tablet Generic drug:  potassium chloride Take 10 mEq by mouth 2 (two) times daily.   losartan 50 MG tablet Commonly known as:  COZAAR Take 50 mg by mouth daily.   mupirocin ointment 2 % Commonly known as:  BACTROBAN Place 1 application into the nose 2 (two) times daily.   PATADAY 0.2 % Soln Generic drug:  Olopatadine HCl INSTILL 1 DROP EVERY DAY INTO BOTH EYES   PROAIR HFA IN Inhale into the lungs as needed.   albuterol (2.5 MG/3ML)  0.083% nebulizer solution Commonly known as:  PROVENTIL INHALE 3 ML AS NEEDED EVERY 6 HOURS FOR WHEEZING/ASTHMA INHALATION   PROBIOTIC PO Take by mouth daily.   RHINOCORT ALLERGY 32 MCG/ACT nasal spray Generic drug:  budesonide Place 1 spray into both nostrils daily.   tiZANidine 2 MG tablet Commonly known as:  ZANAFLEX Take 2 mg by mouth every 8 (eight) hours as needed for muscle spasms.   traMADol 50 MG tablet Commonly known as:  ULTRAM Take 50 mg by mouth every 6 (six) hours as needed.       Past Medical History:  Diagnosis Date  . Asthma     History reviewed. No pertinent surgical history.  Review of systems negative except as noted in HPI / PMHx or noted below:  Review of Systems  Constitutional: Negative.   HENT: Negative.   Eyes: Negative.   Respiratory: Negative.   Cardiovascular: Negative.   Gastrointestinal: Negative.   Genitourinary: Negative.   Musculoskeletal: Negative.   Skin: Negative.   Neurological: Negative.   Endo/Heme/Allergies: Negative.   Psychiatric/Behavioral: Negative.      Objective:   Vitals:   07/19/16 1130  BP: (!) 140/100  Pulse: 92  Resp: 18   Height: 5' (152.4 cm)  Weight: 201 lb (91.2 kg)   Physical Exam  Constitutional: She is well-developed, well-nourished, and in no distress.  HENT:  Head: Normocephalic.  Right Ear: Tympanic membrane, external ear and ear canal normal.  Left Ear: Tympanic membrane, external ear and ear canal normal.  Nose: Nose normal. No mucosal edema or rhinorrhea.  Mouth/Throat: Uvula is midline, oropharynx is clear and moist and mucous membranes are normal. No oropharyngeal exudate.  Isolated aphthous stomatitis ulcer right buccal mucosa  Eyes: Conjunctivae are normal.  Neck: Trachea normal. No tracheal tenderness present. No tracheal deviation present. No thyromegaly present.  Cardiovascular: Normal rate, regular rhythm, S1 normal, S2 normal and normal heart sounds.   No murmur  heard. Pulmonary/Chest: Breath sounds normal. No stridor. No respiratory distress. She has no wheezes. She has no rales.  Musculoskeletal: She exhibits no edema.  Lymphadenopathy:       Head (right side): No tonsillar adenopathy present.       Head (left side): No tonsillar adenopathy present.    She has no cervical adenopathy.  Neurological: She is alert. Gait normal.  Skin: No rash noted. She is not diaphoretic. No erythema. Nails show no clubbing.  Psychiatric: Mood and affect normal.    Diagnostics: Oxygen saturation at rest on room air was 99%. With exercise up and down the hallway it dropped to 98% on room air   Spirometry was performed and demonstrated an FEV1 of 1.33 at 63 % of predicted.  The patient had an Asthma Control Test with the following results: ACT Total Score: 10.    Review of a chest  CT angiogram performed 07/17/2016 identified no evidence of pulmonary embolus. She did not appear to have any obvious mediastinal or lung abnormality.  Assessment and Plan:   1. Asthma with COPD with exacerbation ([place])   2. Other allergic rhinitis   3. Gastroesophageal reflux disease, esophagitis presence not specified   4. Aphthous stomatitis     1. Continue to Treat inflammation:   A. Arnuity 200 1 inhalations one time per day  B. OTC Rhinocort one spray each nostril one time per day  2. Continue to Treat reflux:   A. Continue to minimize all caffeine and chocolate use  B. continue Dexilant 60 mg in the morning  3. Continue ProAir HFA and antihistamine if needed  4. Depo-Medrol 80 IM delivered in clinic today  5. Diflucan 150 mg tablet single tablet today  6. Can continue mouthwash from Dr. Laqueta Due  7. Follow through with heart exam with Dr. Baird Cancer  8. Return to clinic in 6 months or earlier if problem  I have given Autumn Carter a systemic steroid today to help with any inflammation that may be contributing to her respiratory tract symptoms and I did give her a single  dose of fluconazole to treat any issue involved with fungal overgrowth that may be contributing to her aphthous stomatitis. The etiology of her complaints is not entirely clear. She did have a stress echo about one year ago which apparently did not identify any heart disease but I think it will be worthwhile to have her obtain another echocardiogram to see if for some reason she developed issues with poor heart function although certainly her physical exam does not really identify any evidence of significant pump function. She appears to have a fixed physiologic deficit involving her lung function which has been a long-standing issue and probably is tied up with long-standing asthma and possible side effects from her radiation and chemotherapy that was administered for her cancer. If she does not do well with the therapy mentioned above and returns back to a normal functioning stay then she is going to require further evaluation for other etiologic factors above and beyond COPD with asthma contributing to her symptoms. Her aphthous stomatitis, although possibly secondary to fungal overgrowth, may signify a more systemic disease contributing to some of her complaints. She will keep in contact with me noting her response as she moves forward.  Allena Katz, MD Allergy / Immunology [place]

## 2016-07-26 ENCOUNTER — Telehealth: Payer: Self-pay | Admitting: Allergy and Immunology

## 2016-07-26 NOTE — Telephone Encounter (Signed)
Advised patient if having symptoms can increase to twice daily Rhinocort and go back to once daily if no symptoms

## 2016-07-26 NOTE — Telephone Encounter (Signed)
Patient was reduced to one spray a day with her nasal spray Patient has noticed more dizzy spells and watery eyes Can she increase back to two sprays a day?? Please call pt at [phone] before 12:00 or [phone] after 12:00

## 2016-07-31 DIAGNOSIS — J4531 Mild persistent asthma with (acute) exacerbation: Secondary | ICD-10-CM | POA: Diagnosis not present

## 2016-08-09 ENCOUNTER — Encounter: Payer: Self-pay | Admitting: *Deleted

## 2016-08-09 DIAGNOSIS — R7309 Other abnormal glucose: Secondary | ICD-10-CM | POA: Diagnosis not present

## 2016-08-09 DIAGNOSIS — M2550 Pain in unspecified joint: Secondary | ICD-10-CM | POA: Diagnosis not present

## 2016-08-09 DIAGNOSIS — I10 Essential (primary) hypertension: Secondary | ICD-10-CM | POA: Diagnosis not present

## 2016-08-09 DIAGNOSIS — Z1159 Encounter for screening for other viral diseases: Secondary | ICD-10-CM | POA: Diagnosis not present

## 2016-08-12 DIAGNOSIS — M25571 Pain in right ankle and joints of right foot: Secondary | ICD-10-CM | POA: Diagnosis not present

## 2016-08-12 DIAGNOSIS — M542 Cervicalgia: Secondary | ICD-10-CM | POA: Diagnosis not present

## 2016-08-12 DIAGNOSIS — M545 Low back pain: Secondary | ICD-10-CM | POA: Diagnosis not present

## 2016-08-13 DIAGNOSIS — Z1159 Encounter for screening for other viral diseases: Secondary | ICD-10-CM | POA: Diagnosis not present

## 2016-08-13 DIAGNOSIS — M2550 Pain in unspecified joint: Secondary | ICD-10-CM | POA: Diagnosis not present

## 2016-08-13 DIAGNOSIS — S81801A Unspecified open wound, right lower leg, initial encounter: Secondary | ICD-10-CM | POA: Diagnosis not present

## 2016-08-13 DIAGNOSIS — R7309 Other abnormal glucose: Secondary | ICD-10-CM | POA: Diagnosis not present

## 2016-08-13 DIAGNOSIS — Z79899 Other long term (current) drug therapy: Secondary | ICD-10-CM | POA: Diagnosis not present

## 2016-08-13 DIAGNOSIS — I10 Essential (primary) hypertension: Secondary | ICD-10-CM | POA: Diagnosis not present

## 2016-08-14 DIAGNOSIS — Z23 Encounter for immunization: Secondary | ICD-10-CM | POA: Diagnosis not present

## 2016-08-20 DIAGNOSIS — L03115 Cellulitis of right lower limb: Secondary | ICD-10-CM | POA: Diagnosis not present

## 2016-08-20 DIAGNOSIS — S81801D Unspecified open wound, right lower leg, subsequent encounter: Secondary | ICD-10-CM | POA: Diagnosis not present

## 2016-08-26 DIAGNOSIS — L03115 Cellulitis of right lower limb: Secondary | ICD-10-CM | POA: Diagnosis not present

## 2016-08-26 DIAGNOSIS — S81801D Unspecified open wound, right lower leg, subsequent encounter: Secondary | ICD-10-CM | POA: Diagnosis not present

## 2016-08-29 DIAGNOSIS — M7989 Other specified soft tissue disorders: Secondary | ICD-10-CM | POA: Diagnosis not present

## 2016-08-29 DIAGNOSIS — S81801A Unspecified open wound, right lower leg, initial encounter: Secondary | ICD-10-CM | POA: Diagnosis not present

## 2016-08-29 DIAGNOSIS — I081 Rheumatic disorders of both mitral and tricuspid valves: Secondary | ICD-10-CM | POA: Diagnosis not present

## 2016-08-29 DIAGNOSIS — M7122 Synovial cyst of popliteal space [Baker], left knee: Secondary | ICD-10-CM | POA: Diagnosis not present

## 2016-08-29 DIAGNOSIS — S81809A Unspecified open wound, unspecified lower leg, initial encounter: Secondary | ICD-10-CM | POA: Diagnosis not present

## 2016-08-29 DIAGNOSIS — R928 Other abnormal and inconclusive findings on diagnostic imaging of breast: Secondary | ICD-10-CM | POA: Diagnosis not present

## 2016-08-29 DIAGNOSIS — N644 Mastodynia: Secondary | ICD-10-CM | POA: Diagnosis not present

## 2016-08-30 DIAGNOSIS — J4531 Mild persistent asthma with (acute) exacerbation: Secondary | ICD-10-CM | POA: Diagnosis not present

## 2016-08-30 DIAGNOSIS — I517 Cardiomegaly: Secondary | ICD-10-CM | POA: Diagnosis not present

## 2016-09-05 DIAGNOSIS — I10 Essential (primary) hypertension: Secondary | ICD-10-CM | POA: Diagnosis not present

## 2016-09-05 DIAGNOSIS — S81811D Laceration without foreign body, right lower leg, subsequent encounter: Secondary | ICD-10-CM | POA: Diagnosis not present

## 2016-09-10 DIAGNOSIS — Z853 Personal history of malignant neoplasm of breast: Secondary | ICD-10-CM | POA: Diagnosis not present

## 2016-09-10 DIAGNOSIS — Z170 Estrogen receptor positive status [ER+]: Secondary | ICD-10-CM | POA: Diagnosis not present

## 2016-09-10 DIAGNOSIS — Z86000 Personal history of in-situ neoplasm of breast: Secondary | ICD-10-CM | POA: Diagnosis not present

## 2016-09-16 DIAGNOSIS — J45909 Unspecified asthma, uncomplicated: Secondary | ICD-10-CM | POA: Diagnosis not present

## 2016-09-17 DIAGNOSIS — M546 Pain in thoracic spine: Secondary | ICD-10-CM | POA: Diagnosis not present

## 2016-09-17 DIAGNOSIS — M62838 Other muscle spasm: Secondary | ICD-10-CM | POA: Diagnosis not present

## 2016-09-17 DIAGNOSIS — M542 Cervicalgia: Secondary | ICD-10-CM | POA: Diagnosis not present

## 2016-09-17 DIAGNOSIS — M5442 Lumbago with sciatica, left side: Secondary | ICD-10-CM | POA: Diagnosis not present

## 2016-09-26 DIAGNOSIS — J45909 Unspecified asthma, uncomplicated: Secondary | ICD-10-CM | POA: Diagnosis not present

## 2016-09-26 DIAGNOSIS — K219 Gastro-esophageal reflux disease without esophagitis: Secondary | ICD-10-CM | POA: Diagnosis not present

## 2016-09-26 DIAGNOSIS — N390 Urinary tract infection, site not specified: Secondary | ICD-10-CM | POA: Diagnosis not present

## 2016-09-26 DIAGNOSIS — R39198 Other difficulties with micturition: Secondary | ICD-10-CM | POA: Diagnosis not present

## 2016-09-26 DIAGNOSIS — I10 Essential (primary) hypertension: Secondary | ICD-10-CM | POA: Diagnosis not present

## 2016-09-30 DIAGNOSIS — J4531 Mild persistent asthma with (acute) exacerbation: Secondary | ICD-10-CM | POA: Diagnosis not present

## 2016-10-10 DIAGNOSIS — J45909 Unspecified asthma, uncomplicated: Secondary | ICD-10-CM | POA: Diagnosis not present

## 2016-10-10 DIAGNOSIS — I10 Essential (primary) hypertension: Secondary | ICD-10-CM | POA: Diagnosis not present

## 2016-10-10 DIAGNOSIS — K219 Gastro-esophageal reflux disease without esophagitis: Secondary | ICD-10-CM | POA: Diagnosis not present

## 2016-10-11 ENCOUNTER — Encounter: Payer: Self-pay | Admitting: Genetic Counselor

## 2016-10-11 DIAGNOSIS — Z1379 Encounter for other screening for genetic and chromosomal anomalies: Secondary | ICD-10-CM | POA: Insufficient documentation

## 2016-10-15 DIAGNOSIS — M461 Sacroiliitis, not elsewhere classified: Secondary | ICD-10-CM | POA: Diagnosis not present

## 2016-10-15 DIAGNOSIS — M545 Low back pain: Secondary | ICD-10-CM | POA: Diagnosis not present

## 2016-10-15 DIAGNOSIS — M546 Pain in thoracic spine: Secondary | ICD-10-CM | POA: Diagnosis not present

## 2016-10-15 DIAGNOSIS — M542 Cervicalgia: Secondary | ICD-10-CM | POA: Diagnosis not present

## 2016-10-17 DIAGNOSIS — J4541 Moderate persistent asthma with (acute) exacerbation: Secondary | ICD-10-CM | POA: Diagnosis not present

## 2016-10-17 DIAGNOSIS — K219 Gastro-esophageal reflux disease without esophagitis: Secondary | ICD-10-CM | POA: Diagnosis not present

## 2016-10-17 DIAGNOSIS — R8290 Unspecified abnormal findings in urine: Secondary | ICD-10-CM | POA: Diagnosis not present

## 2016-10-17 DIAGNOSIS — N390 Urinary tract infection, site not specified: Secondary | ICD-10-CM | POA: Diagnosis not present

## 2016-10-22 DIAGNOSIS — M546 Pain in thoracic spine: Secondary | ICD-10-CM | POA: Diagnosis not present

## 2016-10-22 DIAGNOSIS — M545 Low back pain: Secondary | ICD-10-CM | POA: Diagnosis not present

## 2016-10-22 DIAGNOSIS — M542 Cervicalgia: Secondary | ICD-10-CM | POA: Diagnosis not present

## 2016-10-22 DIAGNOSIS — M461 Sacroiliitis, not elsewhere classified: Secondary | ICD-10-CM | POA: Diagnosis not present

## 2016-10-24 DIAGNOSIS — Z79899 Other long term (current) drug therapy: Secondary | ICD-10-CM | POA: Diagnosis not present

## 2016-10-24 DIAGNOSIS — I10 Essential (primary) hypertension: Secondary | ICD-10-CM | POA: Diagnosis not present

## 2016-10-24 DIAGNOSIS — E559 Vitamin D deficiency, unspecified: Secondary | ICD-10-CM | POA: Diagnosis not present

## 2016-10-24 DIAGNOSIS — F419 Anxiety disorder, unspecified: Secondary | ICD-10-CM | POA: Diagnosis not present

## 2016-10-24 DIAGNOSIS — J45909 Unspecified asthma, uncomplicated: Secondary | ICD-10-CM | POA: Diagnosis not present

## 2016-10-24 DIAGNOSIS — R5383 Other fatigue: Secondary | ICD-10-CM | POA: Diagnosis not present

## 2016-10-31 DIAGNOSIS — J4531 Mild persistent asthma with (acute) exacerbation: Secondary | ICD-10-CM | POA: Diagnosis not present

## 2016-11-05 DIAGNOSIS — R1310 Dysphagia, unspecified: Secondary | ICD-10-CM | POA: Diagnosis not present

## 2016-11-05 DIAGNOSIS — K219 Gastro-esophageal reflux disease without esophagitis: Secondary | ICD-10-CM | POA: Diagnosis not present

## 2016-11-05 DIAGNOSIS — K449 Diaphragmatic hernia without obstruction or gangrene: Secondary | ICD-10-CM | POA: Diagnosis not present

## 2016-11-26 DIAGNOSIS — M461 Sacroiliitis, not elsewhere classified: Secondary | ICD-10-CM | POA: Diagnosis not present

## 2016-11-26 DIAGNOSIS — M62838 Other muscle spasm: Secondary | ICD-10-CM | POA: Diagnosis not present

## 2016-11-26 DIAGNOSIS — M545 Low back pain: Secondary | ICD-10-CM | POA: Diagnosis not present

## 2016-11-26 DIAGNOSIS — M5417 Radiculopathy, lumbosacral region: Secondary | ICD-10-CM | POA: Diagnosis not present

## 2016-11-30 DIAGNOSIS — J4531 Mild persistent asthma with (acute) exacerbation: Secondary | ICD-10-CM | POA: Diagnosis not present

## 2016-12-03 DIAGNOSIS — M5030 Other cervical disc degeneration, unspecified cervical region: Secondary | ICD-10-CM | POA: Diagnosis not present

## 2016-12-03 DIAGNOSIS — M5136 Other intervertebral disc degeneration, lumbar region: Secondary | ICD-10-CM | POA: Diagnosis not present

## 2016-12-03 DIAGNOSIS — I10 Essential (primary) hypertension: Secondary | ICD-10-CM | POA: Diagnosis not present

## 2016-12-03 DIAGNOSIS — J4520 Mild intermittent asthma, uncomplicated: Secondary | ICD-10-CM | POA: Diagnosis not present

## 2016-12-09 DIAGNOSIS — M5030 Other cervical disc degeneration, unspecified cervical region: Secondary | ICD-10-CM | POA: Diagnosis not present

## 2016-12-09 DIAGNOSIS — M546 Pain in thoracic spine: Secondary | ICD-10-CM | POA: Diagnosis not present

## 2016-12-09 DIAGNOSIS — M47812 Spondylosis without myelopathy or radiculopathy, cervical region: Secondary | ICD-10-CM | POA: Diagnosis not present

## 2016-12-09 DIAGNOSIS — M5136 Other intervertebral disc degeneration, lumbar region: Secondary | ICD-10-CM | POA: Diagnosis not present

## 2016-12-09 DIAGNOSIS — M545 Low back pain: Secondary | ICD-10-CM | POA: Diagnosis not present

## 2016-12-09 DIAGNOSIS — M5134 Other intervertebral disc degeneration, thoracic region: Secondary | ICD-10-CM | POA: Diagnosis not present

## 2016-12-12 DIAGNOSIS — J4541 Moderate persistent asthma with (acute) exacerbation: Secondary | ICD-10-CM | POA: Diagnosis not present

## 2016-12-12 DIAGNOSIS — R0789 Other chest pain: Secondary | ICD-10-CM | POA: Diagnosis not present

## 2016-12-12 DIAGNOSIS — R05 Cough: Secondary | ICD-10-CM | POA: Diagnosis not present

## 2016-12-19 DIAGNOSIS — J40 Bronchitis, not specified as acute or chronic: Secondary | ICD-10-CM | POA: Diagnosis not present

## 2016-12-19 DIAGNOSIS — F419 Anxiety disorder, unspecified: Secondary | ICD-10-CM | POA: Diagnosis not present

## 2016-12-19 DIAGNOSIS — R05 Cough: Secondary | ICD-10-CM | POA: Diagnosis not present

## 2016-12-19 DIAGNOSIS — J4531 Mild persistent asthma with (acute) exacerbation: Secondary | ICD-10-CM | POA: Diagnosis not present

## 2016-12-30 DIAGNOSIS — M461 Sacroiliitis, not elsewhere classified: Secondary | ICD-10-CM | POA: Diagnosis not present

## 2016-12-30 DIAGNOSIS — M5417 Radiculopathy, lumbosacral region: Secondary | ICD-10-CM | POA: Diagnosis not present

## 2016-12-30 DIAGNOSIS — M62838 Other muscle spasm: Secondary | ICD-10-CM | POA: Diagnosis not present

## 2016-12-30 DIAGNOSIS — M545 Low back pain: Secondary | ICD-10-CM | POA: Diagnosis not present

## 2016-12-31 DIAGNOSIS — J4531 Mild persistent asthma with (acute) exacerbation: Secondary | ICD-10-CM | POA: Diagnosis not present

## 2017-01-10 DIAGNOSIS — Z79899 Other long term (current) drug therapy: Secondary | ICD-10-CM | POA: Diagnosis not present

## 2017-01-17 DIAGNOSIS — M5136 Other intervertebral disc degeneration, lumbar region: Secondary | ICD-10-CM | POA: Diagnosis not present

## 2017-01-17 DIAGNOSIS — J4540 Moderate persistent asthma, uncomplicated: Secondary | ICD-10-CM | POA: Diagnosis not present

## 2017-01-17 DIAGNOSIS — M461 Sacroiliitis, not elsewhere classified: Secondary | ICD-10-CM | POA: Diagnosis not present

## 2017-01-17 DIAGNOSIS — M546 Pain in thoracic spine: Secondary | ICD-10-CM | POA: Diagnosis not present

## 2017-01-17 DIAGNOSIS — M62838 Other muscle spasm: Secondary | ICD-10-CM | POA: Diagnosis not present

## 2017-01-17 DIAGNOSIS — R209 Unspecified disturbances of skin sensation: Secondary | ICD-10-CM | POA: Diagnosis not present

## 2017-01-17 DIAGNOSIS — M5030 Other cervical disc degeneration, unspecified cervical region: Secondary | ICD-10-CM | POA: Diagnosis not present

## 2017-01-17 DIAGNOSIS — M5442 Lumbago with sciatica, left side: Secondary | ICD-10-CM | POA: Diagnosis not present

## 2017-01-20 ENCOUNTER — Ambulatory Visit: Payer: BLUE CROSS/BLUE SHIELD | Admitting: Allergy and Immunology

## 2017-01-20 DIAGNOSIS — M546 Pain in thoracic spine: Secondary | ICD-10-CM | POA: Diagnosis not present

## 2017-01-20 DIAGNOSIS — M62838 Other muscle spasm: Secondary | ICD-10-CM | POA: Diagnosis not present

## 2017-01-20 DIAGNOSIS — M461 Sacroiliitis, not elsewhere classified: Secondary | ICD-10-CM | POA: Diagnosis not present

## 2017-01-20 DIAGNOSIS — M5442 Lumbago with sciatica, left side: Secondary | ICD-10-CM | POA: Diagnosis not present

## 2017-01-21 ENCOUNTER — Other Ambulatory Visit: Payer: Self-pay | Admitting: Internal Medicine

## 2017-01-21 DIAGNOSIS — M461 Sacroiliitis, not elsewhere classified: Secondary | ICD-10-CM | POA: Diagnosis not present

## 2017-01-21 DIAGNOSIS — M546 Pain in thoracic spine: Secondary | ICD-10-CM | POA: Diagnosis not present

## 2017-01-21 DIAGNOSIS — M62838 Other muscle spasm: Secondary | ICD-10-CM | POA: Diagnosis not present

## 2017-01-21 DIAGNOSIS — M5442 Lumbago with sciatica, left side: Secondary | ICD-10-CM | POA: Diagnosis not present

## 2017-01-21 DIAGNOSIS — M5136 Other intervertebral disc degeneration, lumbar region: Secondary | ICD-10-CM

## 2017-01-24 DIAGNOSIS — M5442 Lumbago with sciatica, left side: Secondary | ICD-10-CM | POA: Diagnosis not present

## 2017-01-24 DIAGNOSIS — M62838 Other muscle spasm: Secondary | ICD-10-CM | POA: Diagnosis not present

## 2017-01-24 DIAGNOSIS — M546 Pain in thoracic spine: Secondary | ICD-10-CM | POA: Diagnosis not present

## 2017-01-24 DIAGNOSIS — M461 Sacroiliitis, not elsewhere classified: Secondary | ICD-10-CM | POA: Diagnosis not present

## 2017-01-26 ENCOUNTER — Ambulatory Visit
Admission: RE | Admit: 2017-01-26 | Discharge: 2017-01-26 | Disposition: A | Discharge status: Home / Self Care | Payer: BLUE CROSS/BLUE SHIELD | Source: Ambulatory Visit | Attending: Internal Medicine | Admitting: Internal Medicine

## 2017-01-26 DIAGNOSIS — M5136 Other intervertebral disc degeneration, lumbar region: Secondary | ICD-10-CM

## 2017-01-26 DIAGNOSIS — M48061 Spinal stenosis, lumbar region without neurogenic claudication: Secondary | ICD-10-CM | POA: Diagnosis not present

## 2017-01-27 DIAGNOSIS — J209 Acute bronchitis, unspecified: Secondary | ICD-10-CM | POA: Diagnosis not present

## 2017-01-30 DIAGNOSIS — J4531 Mild persistent asthma with (acute) exacerbation: Secondary | ICD-10-CM | POA: Diagnosis not present

## 2017-02-04 DIAGNOSIS — M461 Sacroiliitis, not elsewhere classified: Secondary | ICD-10-CM | POA: Diagnosis not present

## 2017-02-04 DIAGNOSIS — M62838 Other muscle spasm: Secondary | ICD-10-CM | POA: Diagnosis not present

## 2017-02-04 DIAGNOSIS — M5117 Intervertebral disc disorders with radiculopathy, lumbosacral region: Secondary | ICD-10-CM | POA: Diagnosis not present

## 2017-02-04 DIAGNOSIS — M5116 Intervertebral disc disorders with radiculopathy, lumbar region: Secondary | ICD-10-CM | POA: Diagnosis not present

## 2017-02-05 DIAGNOSIS — M48061 Spinal stenosis, lumbar region without neurogenic claudication: Secondary | ICD-10-CM | POA: Diagnosis not present

## 2017-02-05 DIAGNOSIS — M7989 Other specified soft tissue disorders: Secondary | ICD-10-CM | POA: Diagnosis not present

## 2017-02-05 DIAGNOSIS — S8012XA Contusion of left lower leg, initial encounter: Secondary | ICD-10-CM | POA: Diagnosis not present

## 2017-02-05 DIAGNOSIS — I10 Essential (primary) hypertension: Secondary | ICD-10-CM | POA: Diagnosis not present

## 2017-02-10 DIAGNOSIS — M461 Sacroiliitis, not elsewhere classified: Secondary | ICD-10-CM | POA: Diagnosis not present

## 2017-02-10 DIAGNOSIS — M62838 Other muscle spasm: Secondary | ICD-10-CM | POA: Diagnosis not present

## 2017-02-10 DIAGNOSIS — M5117 Intervertebral disc disorders with radiculopathy, lumbosacral region: Secondary | ICD-10-CM | POA: Diagnosis not present

## 2017-02-10 DIAGNOSIS — M5116 Intervertebral disc disorders with radiculopathy, lumbar region: Secondary | ICD-10-CM | POA: Diagnosis not present

## 2017-02-14 DIAGNOSIS — Z79899 Other long term (current) drug therapy: Secondary | ICD-10-CM | POA: Diagnosis not present

## 2017-02-14 DIAGNOSIS — I10 Essential (primary) hypertension: Secondary | ICD-10-CM | POA: Diagnosis not present

## 2017-02-14 DIAGNOSIS — M47816 Spondylosis without myelopathy or radiculopathy, lumbar region: Secondary | ICD-10-CM | POA: Diagnosis not present

## 2017-02-14 DIAGNOSIS — J4520 Mild intermittent asthma, uncomplicated: Secondary | ICD-10-CM | POA: Diagnosis not present

## 2017-02-17 DIAGNOSIS — M5116 Intervertebral disc disorders with radiculopathy, lumbar region: Secondary | ICD-10-CM | POA: Diagnosis not present

## 2017-02-17 DIAGNOSIS — M542 Cervicalgia: Secondary | ICD-10-CM | POA: Diagnosis not present

## 2017-02-17 DIAGNOSIS — M5117 Intervertebral disc disorders with radiculopathy, lumbosacral region: Secondary | ICD-10-CM | POA: Diagnosis not present

## 2017-02-17 DIAGNOSIS — R51 Headache: Secondary | ICD-10-CM | POA: Diagnosis not present

## 2017-02-20 DIAGNOSIS — M545 Low back pain: Secondary | ICD-10-CM | POA: Diagnosis not present

## 2017-02-20 DIAGNOSIS — M5106 Intervertebral disc disorders with myelopathy, lumbar region: Secondary | ICD-10-CM | POA: Diagnosis not present

## 2017-02-20 DIAGNOSIS — M48062 Spinal stenosis, lumbar region with neurogenic claudication: Secondary | ICD-10-CM | POA: Diagnosis not present

## 2017-02-20 DIAGNOSIS — M542 Cervicalgia: Secondary | ICD-10-CM | POA: Diagnosis not present

## 2017-02-24 DIAGNOSIS — M5116 Intervertebral disc disorders with radiculopathy, lumbar region: Secondary | ICD-10-CM | POA: Diagnosis not present

## 2017-02-24 DIAGNOSIS — M5117 Intervertebral disc disorders with radiculopathy, lumbosacral region: Secondary | ICD-10-CM | POA: Diagnosis not present

## 2017-02-24 DIAGNOSIS — M542 Cervicalgia: Secondary | ICD-10-CM | POA: Diagnosis not present

## 2017-02-24 DIAGNOSIS — R51 Headache: Secondary | ICD-10-CM | POA: Diagnosis not present

## 2017-02-25 DIAGNOSIS — F419 Anxiety disorder, unspecified: Secondary | ICD-10-CM | POA: Diagnosis not present

## 2017-02-25 DIAGNOSIS — J4520 Mild intermittent asthma, uncomplicated: Secondary | ICD-10-CM | POA: Diagnosis not present

## 2017-02-25 DIAGNOSIS — M47816 Spondylosis without myelopathy or radiculopathy, lumbar region: Secondary | ICD-10-CM | POA: Diagnosis not present

## 2017-02-25 DIAGNOSIS — I10 Essential (primary) hypertension: Secondary | ICD-10-CM | POA: Diagnosis not present

## 2017-03-02 DIAGNOSIS — J4531 Mild persistent asthma with (acute) exacerbation: Secondary | ICD-10-CM | POA: Diagnosis not present

## 2017-03-05 DIAGNOSIS — M542 Cervicalgia: Secondary | ICD-10-CM | POA: Diagnosis not present

## 2017-03-05 DIAGNOSIS — M62838 Other muscle spasm: Secondary | ICD-10-CM | POA: Diagnosis not present

## 2017-03-05 DIAGNOSIS — M5441 Lumbago with sciatica, right side: Secondary | ICD-10-CM | POA: Diagnosis not present

## 2017-03-05 DIAGNOSIS — M461 Sacroiliitis, not elsewhere classified: Secondary | ICD-10-CM | POA: Diagnosis not present

## 2017-03-12 DIAGNOSIS — I10 Essential (primary) hypertension: Secondary | ICD-10-CM | POA: Diagnosis not present

## 2017-03-12 DIAGNOSIS — J4520 Mild intermittent asthma, uncomplicated: Secondary | ICD-10-CM | POA: Diagnosis not present

## 2017-03-13 DIAGNOSIS — M546 Pain in thoracic spine: Secondary | ICD-10-CM | POA: Diagnosis not present

## 2017-03-13 DIAGNOSIS — M461 Sacroiliitis, not elsewhere classified: Secondary | ICD-10-CM | POA: Diagnosis not present

## 2017-03-13 DIAGNOSIS — M5442 Lumbago with sciatica, left side: Secondary | ICD-10-CM | POA: Diagnosis not present

## 2017-03-13 DIAGNOSIS — M62838 Other muscle spasm: Secondary | ICD-10-CM | POA: Diagnosis not present

## 2017-03-19 DIAGNOSIS — I10 Essential (primary) hypertension: Secondary | ICD-10-CM | POA: Diagnosis not present

## 2017-03-20 DIAGNOSIS — M5106 Intervertebral disc disorders with myelopathy, lumbar region: Secondary | ICD-10-CM | POA: Diagnosis not present

## 2017-03-20 DIAGNOSIS — M48062 Spinal stenosis, lumbar region with neurogenic claudication: Secondary | ICD-10-CM | POA: Diagnosis not present

## 2017-03-20 DIAGNOSIS — M545 Low back pain: Secondary | ICD-10-CM | POA: Diagnosis not present

## 2017-03-20 DIAGNOSIS — Z4689 Encounter for fitting and adjustment of other specified devices: Secondary | ICD-10-CM | POA: Diagnosis not present

## 2017-03-24 DIAGNOSIS — M62838 Other muscle spasm: Secondary | ICD-10-CM | POA: Diagnosis not present

## 2017-03-24 DIAGNOSIS — M5106 Intervertebral disc disorders with myelopathy, lumbar region: Secondary | ICD-10-CM | POA: Diagnosis not present

## 2017-03-24 DIAGNOSIS — M546 Pain in thoracic spine: Secondary | ICD-10-CM | POA: Diagnosis not present

## 2017-03-24 DIAGNOSIS — M542 Cervicalgia: Secondary | ICD-10-CM | POA: Diagnosis not present

## 2017-03-31 DIAGNOSIS — I10 Essential (primary) hypertension: Secondary | ICD-10-CM | POA: Diagnosis not present

## 2017-03-31 DIAGNOSIS — Z1231 Encounter for screening mammogram for malignant neoplasm of breast: Secondary | ICD-10-CM | POA: Diagnosis not present

## 2017-04-02 DIAGNOSIS — J4531 Mild persistent asthma with (acute) exacerbation: Secondary | ICD-10-CM | POA: Diagnosis not present

## 2017-04-04 DIAGNOSIS — M546 Pain in thoracic spine: Secondary | ICD-10-CM | POA: Diagnosis not present

## 2017-04-04 DIAGNOSIS — M545 Low back pain: Secondary | ICD-10-CM | POA: Diagnosis not present

## 2017-04-04 DIAGNOSIS — M62838 Other muscle spasm: Secondary | ICD-10-CM | POA: Diagnosis not present

## 2017-04-04 DIAGNOSIS — M461 Sacroiliitis, not elsewhere classified: Secondary | ICD-10-CM | POA: Diagnosis not present

## 2017-04-08 DIAGNOSIS — M48062 Spinal stenosis, lumbar region with neurogenic claudication: Secondary | ICD-10-CM | POA: Diagnosis not present

## 2017-04-08 DIAGNOSIS — M545 Low back pain: Secondary | ICD-10-CM | POA: Diagnosis not present

## 2017-04-09 DIAGNOSIS — J4520 Mild intermittent asthma, uncomplicated: Secondary | ICD-10-CM | POA: Diagnosis not present

## 2017-04-09 DIAGNOSIS — F419 Anxiety disorder, unspecified: Secondary | ICD-10-CM | POA: Diagnosis not present

## 2017-04-09 DIAGNOSIS — K219 Gastro-esophageal reflux disease without esophagitis: Secondary | ICD-10-CM | POA: Diagnosis not present

## 2017-04-09 DIAGNOSIS — I10 Essential (primary) hypertension: Secondary | ICD-10-CM | POA: Diagnosis not present

## 2017-04-10 DIAGNOSIS — M48062 Spinal stenosis, lumbar region with neurogenic claudication: Secondary | ICD-10-CM | POA: Diagnosis not present

## 2017-04-10 DIAGNOSIS — M545 Low back pain: Secondary | ICD-10-CM | POA: Diagnosis not present

## 2017-04-14 DIAGNOSIS — M546 Pain in thoracic spine: Secondary | ICD-10-CM | POA: Diagnosis not present

## 2017-04-14 DIAGNOSIS — M62838 Other muscle spasm: Secondary | ICD-10-CM | POA: Diagnosis not present

## 2017-04-14 DIAGNOSIS — M545 Low back pain: Secondary | ICD-10-CM | POA: Diagnosis not present

## 2017-04-14 DIAGNOSIS — M461 Sacroiliitis, not elsewhere classified: Secondary | ICD-10-CM | POA: Diagnosis not present

## 2017-04-14 DIAGNOSIS — M48062 Spinal stenosis, lumbar region with neurogenic claudication: Secondary | ICD-10-CM | POA: Diagnosis not present

## 2017-04-17 DIAGNOSIS — M48062 Spinal stenosis, lumbar region with neurogenic claudication: Secondary | ICD-10-CM | POA: Diagnosis not present

## 2017-04-17 DIAGNOSIS — M545 Low back pain: Secondary | ICD-10-CM | POA: Diagnosis not present

## 2017-04-21 DIAGNOSIS — M545 Low back pain: Secondary | ICD-10-CM | POA: Diagnosis not present

## 2017-04-21 DIAGNOSIS — M48062 Spinal stenosis, lumbar region with neurogenic claudication: Secondary | ICD-10-CM | POA: Diagnosis not present

## 2017-04-23 DIAGNOSIS — M48062 Spinal stenosis, lumbar region with neurogenic claudication: Secondary | ICD-10-CM | POA: Diagnosis not present

## 2017-04-23 DIAGNOSIS — M545 Low back pain: Secondary | ICD-10-CM | POA: Diagnosis not present

## 2017-04-28 DIAGNOSIS — M48062 Spinal stenosis, lumbar region with neurogenic claudication: Secondary | ICD-10-CM | POA: Diagnosis not present

## 2017-04-28 DIAGNOSIS — M545 Low back pain: Secondary | ICD-10-CM | POA: Diagnosis not present

## 2017-04-30 DIAGNOSIS — J4531 Mild persistent asthma with (acute) exacerbation: Secondary | ICD-10-CM | POA: Diagnosis not present

## 2017-04-30 DIAGNOSIS — I10 Essential (primary) hypertension: Secondary | ICD-10-CM | POA: Diagnosis not present

## 2017-04-30 DIAGNOSIS — K219 Gastro-esophageal reflux disease without esophagitis: Secondary | ICD-10-CM | POA: Diagnosis not present

## 2017-04-30 DIAGNOSIS — M47816 Spondylosis without myelopathy or radiculopathy, lumbar region: Secondary | ICD-10-CM | POA: Diagnosis not present

## 2017-04-30 DIAGNOSIS — Z79899 Other long term (current) drug therapy: Secondary | ICD-10-CM | POA: Diagnosis not present

## 2017-04-30 DIAGNOSIS — R7309 Other abnormal glucose: Secondary | ICD-10-CM | POA: Diagnosis not present

## 2017-05-02 DIAGNOSIS — M48062 Spinal stenosis, lumbar region with neurogenic claudication: Secondary | ICD-10-CM | POA: Diagnosis not present

## 2017-05-02 DIAGNOSIS — M545 Low back pain: Secondary | ICD-10-CM | POA: Diagnosis not present

## 2017-05-05 DIAGNOSIS — M545 Low back pain: Secondary | ICD-10-CM | POA: Diagnosis not present

## 2017-05-05 DIAGNOSIS — M48062 Spinal stenosis, lumbar region with neurogenic claudication: Secondary | ICD-10-CM | POA: Diagnosis not present

## 2017-05-07 DIAGNOSIS — M48062 Spinal stenosis, lumbar region with neurogenic claudication: Secondary | ICD-10-CM | POA: Diagnosis not present

## 2017-05-07 DIAGNOSIS — M545 Low back pain: Secondary | ICD-10-CM | POA: Diagnosis not present

## 2017-05-14 DIAGNOSIS — M461 Sacroiliitis, not elsewhere classified: Secondary | ICD-10-CM | POA: Diagnosis not present

## 2017-05-14 DIAGNOSIS — M62838 Other muscle spasm: Secondary | ICD-10-CM | POA: Diagnosis not present

## 2017-05-14 DIAGNOSIS — M546 Pain in thoracic spine: Secondary | ICD-10-CM | POA: Diagnosis not present

## 2017-05-14 DIAGNOSIS — M545 Low back pain: Secondary | ICD-10-CM | POA: Diagnosis not present

## 2017-05-15 DIAGNOSIS — K219 Gastro-esophageal reflux disease without esophagitis: Secondary | ICD-10-CM | POA: Diagnosis not present

## 2017-05-15 DIAGNOSIS — R110 Nausea: Secondary | ICD-10-CM | POA: Diagnosis not present

## 2017-05-15 DIAGNOSIS — I10 Essential (primary) hypertension: Secondary | ICD-10-CM | POA: Diagnosis not present

## 2017-05-15 DIAGNOSIS — J4520 Mild intermittent asthma, uncomplicated: Secondary | ICD-10-CM | POA: Diagnosis not present

## 2017-05-31 DIAGNOSIS — J4531 Mild persistent asthma with (acute) exacerbation: Secondary | ICD-10-CM | POA: Diagnosis not present

## 2017-06-10 DIAGNOSIS — M461 Sacroiliitis, not elsewhere classified: Secondary | ICD-10-CM | POA: Diagnosis not present

## 2017-06-10 DIAGNOSIS — M62838 Other muscle spasm: Secondary | ICD-10-CM | POA: Diagnosis not present

## 2017-06-10 DIAGNOSIS — M545 Low back pain: Secondary | ICD-10-CM | POA: Diagnosis not present

## 2017-06-13 DIAGNOSIS — L853 Xerosis cutis: Secondary | ICD-10-CM | POA: Diagnosis not present

## 2017-06-13 DIAGNOSIS — I10 Essential (primary) hypertension: Secondary | ICD-10-CM | POA: Diagnosis not present

## 2017-06-13 DIAGNOSIS — M47816 Spondylosis without myelopathy or radiculopathy, lumbar region: Secondary | ICD-10-CM | POA: Diagnosis not present

## 2017-06-13 DIAGNOSIS — K219 Gastro-esophageal reflux disease without esophagitis: Secondary | ICD-10-CM | POA: Diagnosis not present

## 2017-06-19 DIAGNOSIS — Z0001 Encounter for general adult medical examination with abnormal findings: Secondary | ICD-10-CM | POA: Diagnosis not present

## 2017-06-19 DIAGNOSIS — I10 Essential (primary) hypertension: Secondary | ICD-10-CM | POA: Diagnosis not present

## 2017-06-19 DIAGNOSIS — J029 Acute pharyngitis, unspecified: Secondary | ICD-10-CM | POA: Diagnosis not present

## 2017-06-19 DIAGNOSIS — R5383 Other fatigue: Secondary | ICD-10-CM | POA: Diagnosis not present

## 2017-06-19 DIAGNOSIS — J4520 Mild intermittent asthma, uncomplicated: Secondary | ICD-10-CM | POA: Diagnosis not present

## 2017-06-19 DIAGNOSIS — Z1331 Encounter for screening for depression: Secondary | ICD-10-CM | POA: Diagnosis not present

## 2017-06-27 DIAGNOSIS — J45901 Unspecified asthma with (acute) exacerbation: Secondary | ICD-10-CM | POA: Diagnosis not present

## 2017-06-27 DIAGNOSIS — J40 Bronchitis, not specified as acute or chronic: Secondary | ICD-10-CM | POA: Diagnosis not present

## 2017-06-30 DIAGNOSIS — J4531 Mild persistent asthma with (acute) exacerbation: Secondary | ICD-10-CM | POA: Diagnosis not present

## 2017-07-06 DIAGNOSIS — S81801A Unspecified open wound, right lower leg, initial encounter: Secondary | ICD-10-CM | POA: Diagnosis not present

## 2017-07-09 DIAGNOSIS — J4520 Mild intermittent asthma, uncomplicated: Secondary | ICD-10-CM | POA: Diagnosis not present

## 2017-07-09 DIAGNOSIS — J309 Allergic rhinitis, unspecified: Secondary | ICD-10-CM | POA: Diagnosis not present

## 2017-07-09 DIAGNOSIS — I10 Essential (primary) hypertension: Secondary | ICD-10-CM | POA: Diagnosis not present

## 2017-07-14 DIAGNOSIS — R112 Nausea with vomiting, unspecified: Secondary | ICD-10-CM | POA: Diagnosis not present

## 2017-07-14 DIAGNOSIS — R197 Diarrhea, unspecified: Secondary | ICD-10-CM | POA: Diagnosis not present

## 2017-07-24 DIAGNOSIS — L089 Local infection of the skin and subcutaneous tissue, unspecified: Secondary | ICD-10-CM | POA: Diagnosis not present

## 2017-07-24 DIAGNOSIS — T148XXA Other injury of unspecified body region, initial encounter: Secondary | ICD-10-CM | POA: Diagnosis not present

## 2017-07-26 ENCOUNTER — Other Ambulatory Visit: Payer: Self-pay

## 2017-07-26 ENCOUNTER — Emergency Department (HOSPITAL_COMMUNITY)
Admission: EM | Admit: 2017-07-26 | Discharge: 2017-07-26 | Disposition: A | Discharge status: Home / Self Care | Payer: BLUE CROSS/BLUE SHIELD | Attending: Emergency Medicine | Admitting: Emergency Medicine

## 2017-07-26 ENCOUNTER — Emergency Department (HOSPITAL_COMMUNITY): Payer: BLUE CROSS/BLUE SHIELD

## 2017-07-26 DIAGNOSIS — S81812A Laceration without foreign body, left lower leg, initial encounter: Secondary | ICD-10-CM | POA: Insufficient documentation

## 2017-07-26 DIAGNOSIS — Z79899 Other long term (current) drug therapy: Secondary | ICD-10-CM | POA: Insufficient documentation

## 2017-07-26 DIAGNOSIS — J45909 Unspecified asthma, uncomplicated: Secondary | ICD-10-CM | POA: Insufficient documentation

## 2017-07-26 DIAGNOSIS — S81811A Laceration without foreign body, right lower leg, initial encounter: Secondary | ICD-10-CM | POA: Diagnosis not present

## 2017-07-26 DIAGNOSIS — Y92512 Supermarket, store or market as the place of occurrence of the external cause: Secondary | ICD-10-CM | POA: Diagnosis not present

## 2017-07-26 DIAGNOSIS — Z853 Personal history of malignant neoplasm of breast: Secondary | ICD-10-CM | POA: Insufficient documentation

## 2017-07-26 DIAGNOSIS — R000 Tachycardia, unspecified: Secondary | ICD-10-CM | POA: Diagnosis not present

## 2017-07-26 DIAGNOSIS — W010XXA Fall on same level from slipping, tripping and stumbling without subsequent striking against object, initial encounter: Secondary | ICD-10-CM | POA: Diagnosis not present

## 2017-07-26 DIAGNOSIS — I10 Essential (primary) hypertension: Secondary | ICD-10-CM | POA: Diagnosis not present

## 2017-07-26 DIAGNOSIS — W19XXXA Unspecified fall, initial encounter: Secondary | ICD-10-CM | POA: Diagnosis not present

## 2017-07-26 DIAGNOSIS — S8992XA Unspecified injury of left lower leg, initial encounter: Secondary | ICD-10-CM | POA: Diagnosis not present

## 2017-07-26 DIAGNOSIS — Y998 Other external cause status: Secondary | ICD-10-CM | POA: Insufficient documentation

## 2017-07-26 DIAGNOSIS — Y9301 Activity, walking, marching and hiking: Secondary | ICD-10-CM | POA: Insufficient documentation

## 2017-07-26 DIAGNOSIS — S81012A Laceration without foreign body, left knee, initial encounter: Secondary | ICD-10-CM | POA: Diagnosis not present

## 2017-07-26 DIAGNOSIS — S81819A Laceration without foreign body, unspecified lower leg, initial encounter: Secondary | ICD-10-CM

## 2017-07-26 DIAGNOSIS — R58 Hemorrhage, not elsewhere classified: Secondary | ICD-10-CM | POA: Diagnosis not present

## 2017-07-26 MED ORDER — HYDROCODONE-ACETAMINOPHEN 5-325 MG PO TABS: 0.5000 | ORAL_TABLET | Freq: Four times a day (QID) | ORAL | 0 refills | Status: DC | PRN

## 2017-07-26 MED ORDER — LIDOCAINE-EPINEPHRINE (PF) 2 %-1:200000 IJ SOLN
10.0000 mL | Freq: Once | INTRAMUSCULAR | Status: AC
  Administered 2017-07-26: 10 mL
  Filled 2017-07-26: qty 20

## 2017-07-26 MED ORDER — HYDROCODONE-ACETAMINOPHEN 5-325 MG PO TABS
0.5000 | ORAL_TABLET | Freq: Once | ORAL | Status: AC
  Administered 2017-07-26: 0.5 via ORAL
  Filled 2017-07-26: qty 1

## 2017-07-26 NOTE — ED Provider Notes (Signed)
[facility] DEPT Provider Note   CSN: [id number] Arrival date & time: 07/26/17  1513     History   Chief Complaint Chief Complaint  Patient presents with  . Fall  . Extremity Laceration    HPI Autumn Carter is a 62 y.o. female.  Chief complaint is fall, leg lacerations  HPI: 62 year old female.  Slipped on some food at a local store.  Has laceration to her right lower leg and just below her left knee.  History of "poor healing".  Just finished prednisone for her asthma.  Has a granulating wound on her right lower leg that had to be allowed to heal by secondary intention.  Complains of pain and has multiple allergies.  Is able to extend the left knee.  Past Medical History:  Diagnosis Date  . Asthma     Patient Active Problem List   Diagnosis Date Noted  . Genetic testing 10/11/2016  . Obstructive lung disease ([place]) 10/08/2014  . History of allergic rhinitis 10/08/2014  . History of gastroesophageal reflux (GERD) 10/08/2014  . Systemic primary arterial hypertension 10/08/2014  . History of breast cancer 10/08/2014    No past surgical history on file.   OB History   None      Home Medications    Prior to Admission medications   Medication Sig Start Date End Date Taking? Authorizing Provider  Albuterol Sulfate (PROAIR HFA IN) Inhale into the lungs as needed.   Yes [provider]  ammonium lactate (LAC-HYDRIN) 12 % lotion Apply 1 application topically as needed for dry skin.   Yes [provider]  ASMANEX 60 METERED DOSES 220 MCG/INH inhaler INHALE 2 PUFFS IN THE EVENING ONCE A DAY 07/02/17  Yes [provider]  budesonide (RHINOCORT ALLERGY) 32 MCG/ACT nasal spray Place 1 spray into both nostrils daily.   Yes [provider]  cloNIDine (CATAPRES - DOSED IN MG/24 HR) 0.2 mg/24hr patch APPLY 1 PATCH TO SKIN ONCE PER WEEK TRANSDERMAL 90 DAYS 07/19/17  Yes [provider]  dexlansoprazole  (DEXILANT) 60 MG capsule Take 60 mg by mouth daily.   Yes [provider]  diazepam (VALIUM) 5 MG tablet Take 2.5-5 mg by mouth 2 (two) times daily as needed for anxiety (sleep).    Yes [provider]  losartan (COZAAR) 50 MG tablet Take 50 mg by mouth 2 (two) times daily.    Yes [provider]  Probiotic Product (PROBIOTIC PO) Take by mouth daily.   Yes [provider]  tiZANidine (ZANAFLEX) 2 MG tablet Take 2 mg by mouth every 8 (eight) hours as needed for muscle spasms.   Yes [provider]  ARNUITY ELLIPTA 200 MCG/ACT AEPB INHALE 1 PUFF INTO THE LUNGS DAILY. Patient not taking: Reported on 07/26/2017 02/26/16   Jiles Prows, MD  HYDROcodone-acetaminophen (NORCO/VICODIN) 5-325 MG tablet Take 0.5 tablets by mouth every 6 (six) hours as needed for moderate pain. 07/26/17   Tanna Furry, MD    Family History Family History  Problem Relation Age of Onset  . Hypertension Mother   . Hypertension Father   . Hypertension Sister     Social History Social History   Tobacco Use  . Smoking status: Never Smoker  . Smokeless tobacco: Never Used  Substance Use Topics  . Alcohol use: No  . Drug use: No     Allergies   Ace inhibitors; Amoxicillin; Benicar hct [olmesartan medoxomil-hctz]; Cephalexin; Cephalosporins; Clindamycin/lincomycin; Codeine sulfate; Doxazosin; Hydralazine hcl; Hydrochlorothiazide; Lansoprazole;  Levaquin [levofloxacin in d5w]; Macrobid [nitrofurantoin monohyd macro]; Metoprolol tartrate; Morphine and related; Nasonex [mometasone furoate]; Nifedipine; Propoxyphene; Ranitidine; Singulair [montelukast sodium]; Sulfa antibiotics; and Zestril [lisinopril]   Review of Systems Review of Systems  Constitutional: Negative for appetite change, chills, diaphoresis, fatigue and fever.  HENT: Negative for mouth sores, sore throat and trouble swallowing.   Eyes: Negative for visual disturbance.  Respiratory: Negative for cough, chest  tightness, shortness of breath and wheezing.   Cardiovascular: Negative for chest pain.  Gastrointestinal: Negative for abdominal distention, abdominal pain, diarrhea, nausea and vomiting.  Endocrine: Negative for polydipsia, polyphagia and polyuria.  Genitourinary: Negative for dysuria, frequency and hematuria.  Musculoskeletal: Negative for gait problem.  Skin: Positive for wound. Negative for color change, pallor and rash.  Neurological: Negative for dizziness, syncope, light-headedness and headaches.  Hematological: Does not bruise/bleed easily.  Psychiatric/Behavioral: Negative for behavioral problems and confusion.     Physical Exam Updated Vital Signs BP (!) 167/95 (BP Location: Right Arm)   Pulse (!) 113   Temp 98 F (36.7 C) (Oral)   Resp 18   Ht 5\' 2"  (1.575 m)   Wt 86.2 kg (190 lb)   SpO2 100%   BMI 34.75 kg/m   Physical Exam  Constitutional: She is oriented to person, place, and time. She appears well-developed and well-nourished. No distress.  Anxious 62 year old female.  HENT:  Head: Normocephalic.  Eyes: Pupils are equal, round, and reactive to light. Conjunctivae are normal. No scleral icterus.  Neck: Normal range of motion. Neck supple. No thyromegaly present.  Cardiovascular: Normal rate and regular rhythm. Exam reveals no gallop and no friction rub.  No murmur heard. Pulmonary/Chest: Effort normal and breath sounds normal. No respiratory distress. She has no wheezes. She has no rales.  Abdominal: Soft. Bowel sounds are normal. She exhibits no distension. There is no tenderness. There is no rebound.  Musculoskeletal: Normal range of motion.  Neurological: She is alert and oriented to person, place, and time.  Skin: Skin is warm and dry. No rash noted.  10 cm curvilinear laceration to the right lower leg.  Skin in this area is paperthin exposing subcutaneous fat.  Second 6 cm laceration just inferior to the patella.  Extensor mechanism intact.  She is able  to keep the knee extended as the knee is elevated.  Psychiatric: She has a normal mood and affect. Her behavior is normal.     ED Treatments / Results  Labs (all labs ordered are listed, but only abnormal results are displayed) Labs Reviewed - No data to display  EKG None  Radiology Dg Knee Complete 4 Views Left  Result Date: 07/26/2017 CLINICAL DATA:  62 y/o F; fall, laceration, rule out patellar fracture. EXAM: LEFT KNEE - COMPLETE 4+ VIEW COMPARISON:  None. FINDINGS: No evidence of fracture, dislocation, or joint effusion. No evidence of arthropathy or other focal bone abnormality. Soft tissue laceration over the patella. IMPRESSION: 1.  No acute fracture or dislocation identified. 2. Soft tissue laceration over patella. Electronically Signed   By: Kristine Garbe M.D.   On: 07/26/2017 16:30    Procedures Procedures (including critical care time)  Medications Ordered in ED Medications  lidocaine-EPINEPHrine (XYLOCAINE W/EPI) 2 %-1:200000 (PF) injection 10 mL (10 mLs Infiltration Given 07/26/17 1606)  HYDROcodone-acetaminophen (NORCO/VICODIN) 5-325 MG per tablet 0.5 tablet (0.5 tablets Oral Given 07/26/17 1606)     Initial Impression / Assessment and Plan / ED Course  I have reviewed the triage vital signs and the nursing  notes.  Pertinent labs & imaging results that were available during my care of the patient were reviewed by me and considered in my medical decision making (see chart for details).    The right lower leg laceration is in my opinion unrepairable.  Skin is paper thin and I feel that attempts to hold stitch would simply shred the remaining tissue.  This was covered with nonadherent gauze, 4 x 4's, and an Ace wrap.  The left lower leg laceration is repairable.  Her skin is thicker in this area and will likely hold stitch.  Plan x-ray to rule out occult patellar fracture.  Then plan exploration and repair.  Will likely require knee immobilizer.  She states  she "could not handle crutches".   LACERATION REPAIR Performed by: Lolita Patella Authorized by: Lolita Patella Consent: Verbal consent obtained. Risks and benefits: risks, benefits and alternatives were discussed Consent given by: patient Patient identity confirmed: provided demographic data Prepped and Draped in normal sterile fashion Wound explored  Laceration Location: LLE  Laceration Length: 6cm  No Foreign Bodies seen or palpated  Anesthesia: local infiltration  Local anesthetic: lidocaine 1% c epinephrine  Anesthetic total: 8 ml  Irrigation method: syringe Amount of cleaning: standard  Skin closure: Subcutaneous layer closed with running 4-0 Vicryl suture.  6 stitches.  Skin closure with 5 horizontal mattress 4-0 nylon sutures.  Number of sutures: 11  Technique: 2  Layered  Patient tolerance: Patient tolerated the procedure well with no immediate complications.  Wound was explored.  The joint capsule is intact.  This does not violate the capsule.  X-ray showed no fracture.  Wound copiously irrigated.  Placed in a knee immobilizer.  She will wear this until suture removal.  Right lower extremely wound covered with anti-adherent gauze/petroleum gauze dressing, 4 x 4's, and Ace wrap.  I have encouraged her to follow-up with her physician in the next 10 days for suture removal.  She may need wound care referral for the skin tear and large open wound on right lower leg  Final Clinical Impressions(s) / ED Diagnoses   Final diagnoses:  Laceration of lower extremity, unspecified laterality, initial encounter    ED Discharge Orders        Ordered    HYDROcodone-acetaminophen (NORCO/VICODIN) 5-325 MG tablet  Every 6 hours PRN     07/26/17 1706       Tanna Furry, MD 07/26/17 1711

## 2017-07-26 NOTE — ED Triage Notes (Signed)
Per EMS, patient fell at [employer] on yogurt and has lacerations on legs. No LOC, not on blood thinners, no pain. Hypertensive at 190/110- has a history of htn and is on medications.

## 2017-07-26 NOTE — ED Notes (Signed)
Bed: IW80 Expected date: 07/26/17 Expected time: 3:09 PM Means of arrival: Ambulance Comments: Fall bil lac to legs

## 2017-07-26 NOTE — Discharge Instructions (Addendum)
After 48 hours, remove dressings and clean wounds every day. After cleaning with soap and water, applying nonadherent gauze and antibiotic ointment. Keep the right lower extremity wound covered with Ace wrap over Vaseline gauze and antibiotic ointment Follow-up with your physician in 10 days for suture removal. Walk with immobilizer until suture removal.

## 2017-07-28 DIAGNOSIS — S81811A Laceration without foreign body, right lower leg, initial encounter: Secondary | ICD-10-CM | POA: Diagnosis not present

## 2017-07-28 DIAGNOSIS — M25562 Pain in left knee: Secondary | ICD-10-CM | POA: Diagnosis not present

## 2017-07-29 DIAGNOSIS — S81811A Laceration without foreign body, right lower leg, initial encounter: Secondary | ICD-10-CM | POA: Diagnosis not present

## 2017-07-29 DIAGNOSIS — J45909 Unspecified asthma, uncomplicated: Secondary | ICD-10-CM | POA: Diagnosis not present

## 2017-07-29 DIAGNOSIS — I10 Essential (primary) hypertension: Secondary | ICD-10-CM | POA: Diagnosis not present

## 2017-07-29 DIAGNOSIS — W19XXXA Unspecified fall, initial encounter: Secondary | ICD-10-CM | POA: Diagnosis not present

## 2017-07-29 DIAGNOSIS — S81801A Unspecified open wound, right lower leg, initial encounter: Secondary | ICD-10-CM | POA: Diagnosis not present

## 2017-07-29 DIAGNOSIS — S81002A Unspecified open wound, left knee, initial encounter: Secondary | ICD-10-CM | POA: Diagnosis not present

## 2017-07-29 DIAGNOSIS — M1990 Unspecified osteoarthritis, unspecified site: Secondary | ICD-10-CM | POA: Diagnosis not present

## 2017-08-01 DIAGNOSIS — J45909 Unspecified asthma, uncomplicated: Secondary | ICD-10-CM | POA: Diagnosis not present

## 2017-08-01 DIAGNOSIS — Z9181 History of falling: Secondary | ICD-10-CM | POA: Diagnosis not present

## 2017-08-01 DIAGNOSIS — I10 Essential (primary) hypertension: Secondary | ICD-10-CM | POA: Diagnosis not present

## 2017-08-01 DIAGNOSIS — Z853 Personal history of malignant neoplasm of breast: Secondary | ICD-10-CM | POA: Diagnosis not present

## 2017-08-01 DIAGNOSIS — F419 Anxiety disorder, unspecified: Secondary | ICD-10-CM | POA: Diagnosis not present

## 2017-08-01 DIAGNOSIS — S81801D Unspecified open wound, right lower leg, subsequent encounter: Secondary | ICD-10-CM | POA: Diagnosis not present

## 2017-08-01 DIAGNOSIS — M1991 Primary osteoarthritis, unspecified site: Secondary | ICD-10-CM | POA: Diagnosis not present

## 2017-08-01 DIAGNOSIS — S81002D Unspecified open wound, left knee, subsequent encounter: Secondary | ICD-10-CM | POA: Diagnosis not present

## 2017-08-01 DIAGNOSIS — M109 Gout, unspecified: Secondary | ICD-10-CM | POA: Diagnosis not present

## 2017-08-04 DIAGNOSIS — F419 Anxiety disorder, unspecified: Secondary | ICD-10-CM | POA: Diagnosis not present

## 2017-08-04 DIAGNOSIS — S81801D Unspecified open wound, right lower leg, subsequent encounter: Secondary | ICD-10-CM | POA: Diagnosis not present

## 2017-08-04 DIAGNOSIS — M25562 Pain in left knee: Secondary | ICD-10-CM | POA: Diagnosis not present

## 2017-08-04 DIAGNOSIS — M1991 Primary osteoarthritis, unspecified site: Secondary | ICD-10-CM | POA: Diagnosis not present

## 2017-08-04 DIAGNOSIS — M109 Gout, unspecified: Secondary | ICD-10-CM | POA: Diagnosis not present

## 2017-08-04 DIAGNOSIS — M25561 Pain in right knee: Secondary | ICD-10-CM | POA: Diagnosis not present

## 2017-08-04 DIAGNOSIS — Z9181 History of falling: Secondary | ICD-10-CM | POA: Diagnosis not present

## 2017-08-04 DIAGNOSIS — I10 Essential (primary) hypertension: Secondary | ICD-10-CM | POA: Diagnosis not present

## 2017-08-04 DIAGNOSIS — J45909 Unspecified asthma, uncomplicated: Secondary | ICD-10-CM | POA: Diagnosis not present

## 2017-08-04 DIAGNOSIS — M545 Low back pain: Secondary | ICD-10-CM | POA: Diagnosis not present

## 2017-08-04 DIAGNOSIS — Z853 Personal history of malignant neoplasm of breast: Secondary | ICD-10-CM | POA: Diagnosis not present

## 2017-08-04 DIAGNOSIS — S81002D Unspecified open wound, left knee, subsequent encounter: Secondary | ICD-10-CM | POA: Diagnosis not present

## 2017-08-06 DIAGNOSIS — W19XXXA Unspecified fall, initial encounter: Secondary | ICD-10-CM | POA: Diagnosis not present

## 2017-08-06 DIAGNOSIS — S81801A Unspecified open wound, right lower leg, initial encounter: Secondary | ICD-10-CM | POA: Diagnosis not present

## 2017-08-06 DIAGNOSIS — S81002A Unspecified open wound, left knee, initial encounter: Secondary | ICD-10-CM | POA: Diagnosis not present

## 2017-08-06 DIAGNOSIS — S81811A Laceration without foreign body, right lower leg, initial encounter: Secondary | ICD-10-CM | POA: Diagnosis not present

## 2017-08-08 DIAGNOSIS — M1991 Primary osteoarthritis, unspecified site: Secondary | ICD-10-CM | POA: Diagnosis not present

## 2017-08-08 DIAGNOSIS — S81801D Unspecified open wound, right lower leg, subsequent encounter: Secondary | ICD-10-CM | POA: Diagnosis not present

## 2017-08-08 DIAGNOSIS — Z9181 History of falling: Secondary | ICD-10-CM | POA: Diagnosis not present

## 2017-08-08 DIAGNOSIS — M109 Gout, unspecified: Secondary | ICD-10-CM | POA: Diagnosis not present

## 2017-08-08 DIAGNOSIS — F419 Anxiety disorder, unspecified: Secondary | ICD-10-CM | POA: Diagnosis not present

## 2017-08-08 DIAGNOSIS — Z853 Personal history of malignant neoplasm of breast: Secondary | ICD-10-CM | POA: Diagnosis not present

## 2017-08-08 DIAGNOSIS — J45909 Unspecified asthma, uncomplicated: Secondary | ICD-10-CM | POA: Diagnosis not present

## 2017-08-08 DIAGNOSIS — I10 Essential (primary) hypertension: Secondary | ICD-10-CM | POA: Diagnosis not present

## 2017-08-08 DIAGNOSIS — S81002D Unspecified open wound, left knee, subsequent encounter: Secondary | ICD-10-CM | POA: Diagnosis not present

## 2017-08-11 DIAGNOSIS — Z9181 History of falling: Secondary | ICD-10-CM | POA: Diagnosis not present

## 2017-08-11 DIAGNOSIS — J45909 Unspecified asthma, uncomplicated: Secondary | ICD-10-CM | POA: Diagnosis not present

## 2017-08-11 DIAGNOSIS — Z853 Personal history of malignant neoplasm of breast: Secondary | ICD-10-CM | POA: Diagnosis not present

## 2017-08-11 DIAGNOSIS — S81801D Unspecified open wound, right lower leg, subsequent encounter: Secondary | ICD-10-CM | POA: Diagnosis not present

## 2017-08-11 DIAGNOSIS — S81002D Unspecified open wound, left knee, subsequent encounter: Secondary | ICD-10-CM | POA: Diagnosis not present

## 2017-08-11 DIAGNOSIS — M109 Gout, unspecified: Secondary | ICD-10-CM | POA: Diagnosis not present

## 2017-08-11 DIAGNOSIS — I10 Essential (primary) hypertension: Secondary | ICD-10-CM | POA: Diagnosis not present

## 2017-08-11 DIAGNOSIS — F419 Anxiety disorder, unspecified: Secondary | ICD-10-CM | POA: Diagnosis not present

## 2017-08-11 DIAGNOSIS — M1991 Primary osteoarthritis, unspecified site: Secondary | ICD-10-CM | POA: Diagnosis not present

## 2017-08-13 DIAGNOSIS — S81001A Unspecified open wound, right knee, initial encounter: Secondary | ICD-10-CM | POA: Diagnosis not present

## 2017-08-13 DIAGNOSIS — S81811A Laceration without foreign body, right lower leg, initial encounter: Secondary | ICD-10-CM | POA: Diagnosis not present

## 2017-08-13 DIAGNOSIS — S81012A Laceration without foreign body, left knee, initial encounter: Secondary | ICD-10-CM | POA: Diagnosis not present

## 2017-08-13 DIAGNOSIS — J45909 Unspecified asthma, uncomplicated: Secondary | ICD-10-CM | POA: Diagnosis not present

## 2017-08-13 DIAGNOSIS — S81002A Unspecified open wound, left knee, initial encounter: Secondary | ICD-10-CM | POA: Diagnosis not present

## 2017-08-13 DIAGNOSIS — I10 Essential (primary) hypertension: Secondary | ICD-10-CM | POA: Diagnosis not present

## 2017-08-13 DIAGNOSIS — X58XXXA Exposure to other specified factors, initial encounter: Secondary | ICD-10-CM | POA: Diagnosis not present

## 2017-08-15 DIAGNOSIS — J45909 Unspecified asthma, uncomplicated: Secondary | ICD-10-CM | POA: Diagnosis not present

## 2017-08-15 DIAGNOSIS — S81002D Unspecified open wound, left knee, subsequent encounter: Secondary | ICD-10-CM | POA: Diagnosis not present

## 2017-08-15 DIAGNOSIS — F419 Anxiety disorder, unspecified: Secondary | ICD-10-CM | POA: Diagnosis not present

## 2017-08-15 DIAGNOSIS — S81801D Unspecified open wound, right lower leg, subsequent encounter: Secondary | ICD-10-CM | POA: Diagnosis not present

## 2017-08-15 DIAGNOSIS — Z853 Personal history of malignant neoplasm of breast: Secondary | ICD-10-CM | POA: Diagnosis not present

## 2017-08-15 DIAGNOSIS — Z9181 History of falling: Secondary | ICD-10-CM | POA: Diagnosis not present

## 2017-08-15 DIAGNOSIS — I10 Essential (primary) hypertension: Secondary | ICD-10-CM | POA: Diagnosis not present

## 2017-08-15 DIAGNOSIS — M109 Gout, unspecified: Secondary | ICD-10-CM | POA: Diagnosis not present

## 2017-08-15 DIAGNOSIS — M1991 Primary osteoarthritis, unspecified site: Secondary | ICD-10-CM | POA: Diagnosis not present

## 2017-08-18 DIAGNOSIS — M1991 Primary osteoarthritis, unspecified site: Secondary | ICD-10-CM | POA: Diagnosis not present

## 2017-08-18 DIAGNOSIS — J45909 Unspecified asthma, uncomplicated: Secondary | ICD-10-CM | POA: Diagnosis not present

## 2017-08-18 DIAGNOSIS — Z853 Personal history of malignant neoplasm of breast: Secondary | ICD-10-CM | POA: Diagnosis not present

## 2017-08-18 DIAGNOSIS — I10 Essential (primary) hypertension: Secondary | ICD-10-CM | POA: Diagnosis not present

## 2017-08-18 DIAGNOSIS — Z9181 History of falling: Secondary | ICD-10-CM | POA: Diagnosis not present

## 2017-08-18 DIAGNOSIS — S81002D Unspecified open wound, left knee, subsequent encounter: Secondary | ICD-10-CM | POA: Diagnosis not present

## 2017-08-18 DIAGNOSIS — M109 Gout, unspecified: Secondary | ICD-10-CM | POA: Diagnosis not present

## 2017-08-18 DIAGNOSIS — S81801D Unspecified open wound, right lower leg, subsequent encounter: Secondary | ICD-10-CM | POA: Diagnosis not present

## 2017-08-18 DIAGNOSIS — F419 Anxiety disorder, unspecified: Secondary | ICD-10-CM | POA: Diagnosis not present

## 2017-08-20 DIAGNOSIS — S81811A Laceration without foreign body, right lower leg, initial encounter: Secondary | ICD-10-CM | POA: Diagnosis not present

## 2017-08-20 DIAGNOSIS — S81801A Unspecified open wound, right lower leg, initial encounter: Secondary | ICD-10-CM | POA: Diagnosis not present

## 2017-08-20 DIAGNOSIS — W19XXXA Unspecified fall, initial encounter: Secondary | ICD-10-CM | POA: Diagnosis not present

## 2017-08-22 DIAGNOSIS — M1991 Primary osteoarthritis, unspecified site: Secondary | ICD-10-CM | POA: Diagnosis not present

## 2017-08-22 DIAGNOSIS — Z853 Personal history of malignant neoplasm of breast: Secondary | ICD-10-CM | POA: Diagnosis not present

## 2017-08-22 DIAGNOSIS — I10 Essential (primary) hypertension: Secondary | ICD-10-CM | POA: Diagnosis not present

## 2017-08-22 DIAGNOSIS — J45909 Unspecified asthma, uncomplicated: Secondary | ICD-10-CM | POA: Diagnosis not present

## 2017-08-22 DIAGNOSIS — S81002D Unspecified open wound, left knee, subsequent encounter: Secondary | ICD-10-CM | POA: Diagnosis not present

## 2017-08-22 DIAGNOSIS — F419 Anxiety disorder, unspecified: Secondary | ICD-10-CM | POA: Diagnosis not present

## 2017-08-22 DIAGNOSIS — M109 Gout, unspecified: Secondary | ICD-10-CM | POA: Diagnosis not present

## 2017-08-22 DIAGNOSIS — Z9181 History of falling: Secondary | ICD-10-CM | POA: Diagnosis not present

## 2017-08-22 DIAGNOSIS — S81801D Unspecified open wound, right lower leg, subsequent encounter: Secondary | ICD-10-CM | POA: Diagnosis not present

## 2017-08-25 DIAGNOSIS — S81801D Unspecified open wound, right lower leg, subsequent encounter: Secondary | ICD-10-CM | POA: Diagnosis not present

## 2017-08-25 DIAGNOSIS — S81002D Unspecified open wound, left knee, subsequent encounter: Secondary | ICD-10-CM | POA: Diagnosis not present

## 2017-08-25 DIAGNOSIS — Z9181 History of falling: Secondary | ICD-10-CM | POA: Diagnosis not present

## 2017-08-25 DIAGNOSIS — M1991 Primary osteoarthritis, unspecified site: Secondary | ICD-10-CM | POA: Diagnosis not present

## 2017-08-25 DIAGNOSIS — M109 Gout, unspecified: Secondary | ICD-10-CM | POA: Diagnosis not present

## 2017-08-25 DIAGNOSIS — F419 Anxiety disorder, unspecified: Secondary | ICD-10-CM | POA: Diagnosis not present

## 2017-08-25 DIAGNOSIS — Z853 Personal history of malignant neoplasm of breast: Secondary | ICD-10-CM | POA: Diagnosis not present

## 2017-08-25 DIAGNOSIS — I10 Essential (primary) hypertension: Secondary | ICD-10-CM | POA: Diagnosis not present

## 2017-08-25 DIAGNOSIS — J45909 Unspecified asthma, uncomplicated: Secondary | ICD-10-CM | POA: Diagnosis not present

## 2017-08-27 DIAGNOSIS — S81811A Laceration without foreign body, right lower leg, initial encounter: Secondary | ICD-10-CM | POA: Diagnosis not present

## 2017-08-27 DIAGNOSIS — S81801A Unspecified open wound, right lower leg, initial encounter: Secondary | ICD-10-CM | POA: Diagnosis not present

## 2017-08-27 DIAGNOSIS — W19XXXA Unspecified fall, initial encounter: Secondary | ICD-10-CM | POA: Diagnosis not present

## 2017-08-29 DIAGNOSIS — M109 Gout, unspecified: Secondary | ICD-10-CM | POA: Diagnosis not present

## 2017-08-29 DIAGNOSIS — M1991 Primary osteoarthritis, unspecified site: Secondary | ICD-10-CM | POA: Diagnosis not present

## 2017-08-29 DIAGNOSIS — J45909 Unspecified asthma, uncomplicated: Secondary | ICD-10-CM | POA: Diagnosis not present

## 2017-08-29 DIAGNOSIS — F419 Anxiety disorder, unspecified: Secondary | ICD-10-CM | POA: Diagnosis not present

## 2017-08-29 DIAGNOSIS — Z853 Personal history of malignant neoplasm of breast: Secondary | ICD-10-CM | POA: Diagnosis not present

## 2017-08-29 DIAGNOSIS — I10 Essential (primary) hypertension: Secondary | ICD-10-CM | POA: Diagnosis not present

## 2017-08-29 DIAGNOSIS — Z9181 History of falling: Secondary | ICD-10-CM | POA: Diagnosis not present

## 2017-08-29 DIAGNOSIS — S81002D Unspecified open wound, left knee, subsequent encounter: Secondary | ICD-10-CM | POA: Diagnosis not present

## 2017-08-29 DIAGNOSIS — S81801D Unspecified open wound, right lower leg, subsequent encounter: Secondary | ICD-10-CM | POA: Diagnosis not present

## 2017-09-01 DIAGNOSIS — J45909 Unspecified asthma, uncomplicated: Secondary | ICD-10-CM | POA: Diagnosis not present

## 2017-09-01 DIAGNOSIS — M109 Gout, unspecified: Secondary | ICD-10-CM | POA: Diagnosis not present

## 2017-09-01 DIAGNOSIS — M1991 Primary osteoarthritis, unspecified site: Secondary | ICD-10-CM | POA: Diagnosis not present

## 2017-09-01 DIAGNOSIS — S81002D Unspecified open wound, left knee, subsequent encounter: Secondary | ICD-10-CM | POA: Diagnosis not present

## 2017-09-01 DIAGNOSIS — S81801D Unspecified open wound, right lower leg, subsequent encounter: Secondary | ICD-10-CM | POA: Diagnosis not present

## 2017-09-01 DIAGNOSIS — F419 Anxiety disorder, unspecified: Secondary | ICD-10-CM | POA: Diagnosis not present

## 2017-09-01 DIAGNOSIS — I10 Essential (primary) hypertension: Secondary | ICD-10-CM | POA: Diagnosis not present

## 2017-09-01 DIAGNOSIS — Z853 Personal history of malignant neoplasm of breast: Secondary | ICD-10-CM | POA: Diagnosis not present

## 2017-09-01 DIAGNOSIS — Z9181 History of falling: Secondary | ICD-10-CM | POA: Diagnosis not present

## 2017-09-03 DIAGNOSIS — S81811A Laceration without foreign body, right lower leg, initial encounter: Secondary | ICD-10-CM | POA: Diagnosis not present

## 2017-09-03 DIAGNOSIS — Z86000 Personal history of in-situ neoplasm of breast: Secondary | ICD-10-CM

## 2017-09-03 DIAGNOSIS — R030 Elevated blood-pressure reading, without diagnosis of hypertension: Secondary | ICD-10-CM

## 2017-09-03 DIAGNOSIS — Z853 Personal history of malignant neoplasm of breast: Secondary | ICD-10-CM | POA: Diagnosis not present

## 2017-09-05 DIAGNOSIS — Z9181 History of falling: Secondary | ICD-10-CM | POA: Diagnosis not present

## 2017-09-05 DIAGNOSIS — Z853 Personal history of malignant neoplasm of breast: Secondary | ICD-10-CM | POA: Diagnosis not present

## 2017-09-05 DIAGNOSIS — F419 Anxiety disorder, unspecified: Secondary | ICD-10-CM | POA: Diagnosis not present

## 2017-09-05 DIAGNOSIS — M109 Gout, unspecified: Secondary | ICD-10-CM | POA: Diagnosis not present

## 2017-09-05 DIAGNOSIS — S81801D Unspecified open wound, right lower leg, subsequent encounter: Secondary | ICD-10-CM | POA: Diagnosis not present

## 2017-09-05 DIAGNOSIS — I10 Essential (primary) hypertension: Secondary | ICD-10-CM | POA: Diagnosis not present

## 2017-09-05 DIAGNOSIS — J45909 Unspecified asthma, uncomplicated: Secondary | ICD-10-CM | POA: Diagnosis not present

## 2017-09-05 DIAGNOSIS — M1991 Primary osteoarthritis, unspecified site: Secondary | ICD-10-CM | POA: Diagnosis not present

## 2017-09-05 DIAGNOSIS — S81002D Unspecified open wound, left knee, subsequent encounter: Secondary | ICD-10-CM | POA: Diagnosis not present

## 2017-09-08 DIAGNOSIS — Z9181 History of falling: Secondary | ICD-10-CM | POA: Diagnosis not present

## 2017-09-08 DIAGNOSIS — F419 Anxiety disorder, unspecified: Secondary | ICD-10-CM | POA: Diagnosis not present

## 2017-09-08 DIAGNOSIS — S81002D Unspecified open wound, left knee, subsequent encounter: Secondary | ICD-10-CM | POA: Diagnosis not present

## 2017-09-08 DIAGNOSIS — S81801D Unspecified open wound, right lower leg, subsequent encounter: Secondary | ICD-10-CM | POA: Diagnosis not present

## 2017-09-08 DIAGNOSIS — I10 Essential (primary) hypertension: Secondary | ICD-10-CM | POA: Diagnosis not present

## 2017-09-08 DIAGNOSIS — M1991 Primary osteoarthritis, unspecified site: Secondary | ICD-10-CM | POA: Diagnosis not present

## 2017-09-08 DIAGNOSIS — J45909 Unspecified asthma, uncomplicated: Secondary | ICD-10-CM | POA: Diagnosis not present

## 2017-09-08 DIAGNOSIS — M109 Gout, unspecified: Secondary | ICD-10-CM | POA: Diagnosis not present

## 2017-09-08 DIAGNOSIS — Z853 Personal history of malignant neoplasm of breast: Secondary | ICD-10-CM | POA: Diagnosis not present

## 2017-09-10 DIAGNOSIS — S81801A Unspecified open wound, right lower leg, initial encounter: Secondary | ICD-10-CM | POA: Diagnosis not present

## 2017-09-10 DIAGNOSIS — Z87828 Personal history of other (healed) physical injury and trauma: Secondary | ICD-10-CM | POA: Diagnosis not present

## 2017-09-10 DIAGNOSIS — Z09 Encounter for follow-up examination after completed treatment for conditions other than malignant neoplasm: Secondary | ICD-10-CM | POA: Diagnosis not present

## 2017-09-11 DIAGNOSIS — M62838 Other muscle spasm: Secondary | ICD-10-CM | POA: Diagnosis not present

## 2017-09-11 DIAGNOSIS — M546 Pain in thoracic spine: Secondary | ICD-10-CM | POA: Diagnosis not present

## 2017-09-11 DIAGNOSIS — M545 Low back pain: Secondary | ICD-10-CM | POA: Diagnosis not present

## 2017-09-11 DIAGNOSIS — M461 Sacroiliitis, not elsewhere classified: Secondary | ICD-10-CM | POA: Diagnosis not present

## 2017-09-12 DIAGNOSIS — S81002D Unspecified open wound, left knee, subsequent encounter: Secondary | ICD-10-CM | POA: Diagnosis not present

## 2017-09-12 DIAGNOSIS — Z9181 History of falling: Secondary | ICD-10-CM | POA: Diagnosis not present

## 2017-09-12 DIAGNOSIS — I10 Essential (primary) hypertension: Secondary | ICD-10-CM | POA: Diagnosis not present

## 2017-09-12 DIAGNOSIS — J4520 Mild intermittent asthma, uncomplicated: Secondary | ICD-10-CM | POA: Diagnosis not present

## 2017-09-12 DIAGNOSIS — S81801D Unspecified open wound, right lower leg, subsequent encounter: Secondary | ICD-10-CM | POA: Diagnosis not present

## 2017-09-12 DIAGNOSIS — M109 Gout, unspecified: Secondary | ICD-10-CM | POA: Diagnosis not present

## 2017-09-12 DIAGNOSIS — F419 Anxiety disorder, unspecified: Secondary | ICD-10-CM | POA: Diagnosis not present

## 2017-09-12 DIAGNOSIS — M1991 Primary osteoarthritis, unspecified site: Secondary | ICD-10-CM | POA: Diagnosis not present

## 2017-09-12 DIAGNOSIS — J45909 Unspecified asthma, uncomplicated: Secondary | ICD-10-CM | POA: Diagnosis not present

## 2017-09-12 DIAGNOSIS — Z853 Personal history of malignant neoplasm of breast: Secondary | ICD-10-CM | POA: Diagnosis not present

## 2017-09-22 DIAGNOSIS — M79662 Pain in left lower leg: Secondary | ICD-10-CM | POA: Diagnosis not present

## 2017-09-22 DIAGNOSIS — X58XXXA Exposure to other specified factors, initial encounter: Secondary | ICD-10-CM | POA: Diagnosis not present

## 2017-09-22 DIAGNOSIS — M79661 Pain in right lower leg: Secondary | ICD-10-CM | POA: Diagnosis not present

## 2017-09-22 DIAGNOSIS — S81002A Unspecified open wound, left knee, initial encounter: Secondary | ICD-10-CM | POA: Diagnosis not present

## 2017-09-22 DIAGNOSIS — S81801A Unspecified open wound, right lower leg, initial encounter: Secondary | ICD-10-CM | POA: Diagnosis not present

## 2017-09-26 ENCOUNTER — Encounter: Payer: Self-pay | Admitting: Interventional Radiology

## 2017-09-26 DIAGNOSIS — J45909 Unspecified asthma, uncomplicated: Secondary | ICD-10-CM | POA: Diagnosis not present

## 2017-09-26 DIAGNOSIS — I83813 Varicose veins of bilateral lower extremities with pain: Secondary | ICD-10-CM | POA: Diagnosis not present

## 2017-09-26 DIAGNOSIS — R600 Localized edema: Secondary | ICD-10-CM | POA: Diagnosis not present

## 2017-09-26 DIAGNOSIS — S81801A Unspecified open wound, right lower leg, initial encounter: Secondary | ICD-10-CM | POA: Diagnosis not present

## 2017-09-26 DIAGNOSIS — W19XXXA Unspecified fall, initial encounter: Secondary | ICD-10-CM | POA: Diagnosis not present

## 2017-09-26 DIAGNOSIS — S81002A Unspecified open wound, left knee, initial encounter: Secondary | ICD-10-CM | POA: Diagnosis not present

## 2017-09-26 NOTE — Progress Notes (Unsigned)
Patient ID: Autumn Carter, female   DOB: [birth year], 62 y.o.   MRN: [id number]      Chief Complaint: Patient was seen in consultation today for Lower extremity venous insufficiency evaluation    at the request of Dr Jerilee Hoh  Referring Physician(s): Meda Coffee  Supervising Physician: Arne Cleveland  Patient Status: [facility] - Outpt  History of Present Illness: Autumn Carter is a 62 y.o. female [here for evaluation of possible lower extremity venous insufficiency.  The patient had a fall about 2 months ago resulting in laceration of her left knee and right calf anteriorly.  Over the past 2 months these lesions have basically healed with wound care supervision.  She has a long history of visible Carter and spider veins on bilateral lower extremities.    We evaluated her 08/29/2016 for a slow healing wound at that time, and no clinically significant superficial venous insufficiency or acute/chronic DVT was identified.  She currently describes bilateral lower extremity swelling/edema, pain, and foot tiredness.  The symptoms have not significantly changed since her previous evaluation.  She is unable to use graduated compression hose because she can't get them on due to lumbar spine issues, but she does receive some relief with below-knee Ace bandage wrapping.   No history of DVT, superficial thrombophlebitis, or PE.  Positive family history of varicose/spider veins.  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  She does find the leg elevation helps relieve the symptoms.     Past Medical History:  Diagnosis Date  . Asthma     No past surgical history on file.  Allergies: Ace inhibitors; Amoxicillin; Benicar hct [olmesartan medoxomil-hctz]; Cephalexin; Cephalosporins; Clindamycin/lincomycin; Codeine sulfate; Doxazosin; Hydralazine hcl; Hydrochlorothiazide; Lansoprazole; Levaquin [levofloxacin in d5w]; Macrobid [nitrofurantoin monohyd macro]; Metoprolol  tartrate; Morphine and related; Nasonex [mometasone furoate]; Nifedipine; Propoxyphene; Ranitidine; Singulair [montelukast sodium]; Sulfa antibiotics; and Zestril [lisinopril]  Medications: Prior to Admission medications   Medication Sig Start Date End Date Taking? Authorizing Provider  Albuterol Sulfate (PROAIR HFA IN) Inhale into the lungs as needed.    [provider]  ammonium lactate (LAC-HYDRIN) 12 % lotion Apply 1 application topically as needed for dry skin.    [provider]  ARNUITY ELLIPTA 200 MCG/ACT AEPB INHALE 1 PUFF INTO THE LUNGS DAILY. Patient not taking: Reported on 07/26/2017 02/26/16   Jiles Prows, MD  [facility] 60 METERED DOSES 220 MCG/INH inhaler INHALE 2 PUFFS IN THE EVENING ONCE A DAY 07/02/17   [provider]  budesonide (RHINOCORT ALLERGY) 32 MCG/ACT nasal spray Place 1 spray into both nostrils daily.    [provider]  cloNIDine (CATAPRES - DOSED IN MG/24 HR) 0.2 mg/24hr patch APPLY 1 PATCH TO SKIN ONCE PER WEEK TRANSDERMAL 90 DAYS 07/19/17   [provider]  dexlansoprazole (DEXILANT) 60 MG capsule Take 60 mg by mouth daily.    [provider]  diazepam (VALIUM) 5 MG tablet Take 2.5-5 mg by mouth 2 (two) times daily as needed for anxiety (sleep).     [provider]  HYDROcodone-acetaminophen (NORCO/VICODIN) 5-325 MG tablet Take 0.5 tablets by mouth every 6 (six) hours as needed for moderate pain. 07/26/17   Tanna Furry, MD  losartan (COZAAR) 50 MG tablet Take 50 mg by mouth 2 (two) times daily.     [provider]  Probiotic Product (PROBIOTIC PO) Take by mouth daily.    [provider]  tiZANidine (ZANAFLEX) 2 MG tablet Take 2 mg  by mouth every 8 (eight) hours as needed for muscle spasms.    [provider]     Family History  Problem Relation Age of Onset  . Hypertension Mother   . Hypertension Father   . Hypertension Sister     Social History   Socioeconomic History    . Marital status: Married    Spouse name: Not on file  . Number of children: Not on file  . Years of education: Not on file  . Highest education level: Not on file  Occupational History  . Not on file  Social Needs  . Financial resource strain: Not on file  . Food insecurity:    Worry: Not on file    Inability: Not on file  . Transportation needs:    Medical: Not on file    Non-medical: Not on file  Tobacco Use  . Smoking status: Never Smoker  . Smokeless tobacco: Never Used  Substance and Sexual Activity  . Alcohol use: No  . Drug use: No  . Sexual activity: Not on file  Lifestyle  . Physical activity:    Days per week: Not on file    Minutes per session: Not on file  . Stress: Not on file  Relationships  . Social connections:    Talks on phone: Not on file    Gets together: Not on file    Attends religious service: Not on file    Active member of club or organization: Not on file    Attends meetings of clubs or organizations: Not on file    Relationship status: Not on file  Other Topics Concern  . Not on file  Social History Narrative  . Not on file    ECOG Status: 1 - Symptomatic but completely ambulatory   Review of Systems Review of Systems: A 12 point ROS discussed and pertinent positives are indicated in the HPI above.  All other systems are negative.  Vital Signs: There were no vitals taken for this visit.  Physical Exam Constitutional: Oriented to person, place, and time. Well-developed and well-nourished. No distress.   HENT:  Head: Normocephalic and atraumatic.  Eyes: Conjunctivae and EOM are normal. Right eye exhibits no discharge. Left eye exhibits no discharge. No scleral icterus.  Neck: No JVD present.  Pulmonary/Chest: Effort normal. No stridor. No respiratory distress.  Abdomen: soft, non distended Neurological:  alert and oriented to person, place, and time.  Skin: Skin is warm and dry.  not diaphoretic.  Psychiatric:   normal mood and  affect.   behavior is normal. Judgment and thought content normal.   Imaging: BILATERAL LOWER EXTREMITY VENOUS DOPPLER ULTRASOUND  TECHNIQUE: Gray-scale sonography with graded compression, as well as color Doppler and duplex ultrasound, were performed to evaluate the deep and superficial veins of both lower extremities. Spectral Doppler was utilized to evaluate flow at rest and with distal augmentation maneuvers. A complete superficial venous insufficiency exam was performed in the upright standing position. I personally performed the technical portion of the exam.  COMPARISON: 08/29/2016  FINDINGS: Deep Venous System:  Evaluation of the deep venous system including the common femoral, femoral, profunda femoral, popliteal and calf veins (where visible) demonstrate no evidence of deep venous thrombosis. The vessels are compressible and demonstrate normal respiratory phasicity and response to augmentation. No evidence of the deep venous reflux.  Superficial Venous System:  RIGHT  SFJ: Patent, 6 mm diameter, no reflux post augmentation  GSV Prox Thigh: 4 mm, negative  GSV  Mid Thigh: 3 mm  GSV Lower Thigh: 3 mm  GSV Prox Calf: 3 mm, 2.2 seconds reflux post augmentation  GSV Mid Calf: 2 mm, no reflux  GSV Distal Calf: 3 mm  SPJ: Not visualized  SSV Prox: 2 mm, no reflux  SSV Mid: 1 mm  SSV Distal: 1 mm  LEFT  SFJ: Patent, 7 mm diameter, no reflux post augmentation  GSV Prox Thigh: 4 mm, negative  GSV Mid Thigh: 3 mm  GSV Lower Thigh: 3 mm  GSV at Knee:  GSV Prox Calf: 1 mm, negative. There is a perforator vein identified however which shows 2.6 seconds reflux post augmentation.  GSV Mid Calf: 3 mm  GSV Distal Calf: 2 mm  SPJ: Not visualized  SSV Prox: 3 mm  SSV Mid: 2 mm  SSV Distal: 3 mm  IMPRESSION: 1. Negative for lower extremity DVT. 2. Segmental reflux in the right great saphenous vein in the proximal calf, of doubtful clinical  significance in the absence of associated enlarged collaterals or varicose veins. 3. Refluxing perforator vein in the proximal left calf, of doubtful clinical significance in the absence of associated enlarged collateral venous channels or varicose veins.     Labs:  CBC: No results for input(s): WBC, HGB, HCT, PLT in the last 8760 hours.  COAGS: No results for input(s): INR, APTT in the last 8760 hours.  BMP: No results for input(s): NA, K, CL, CO2, GLUCOSE, BUN, CALCIUM, CREATININE, GFRNONAA, GFRAA in the last 8760 hours.  Invalid input(s): CMP  LIVER FUNCTION TESTS: No results for input(s): BILITOT, AST, ALT, ALKPHOS, PROT, ALBUMIN in the last 8760 hours.  TUMOR MARKERS: No results for input(s): AFPTM, CEA, CA199, CHROMGRNA in the last 8760 hours.  Assessment and Plan: My impression is that the patient has no clinically significant lower extremity superficial venous valvular incompetence or reflux.  She has no evidence of acute or chronic DVT in either lower extremity.  There is no evidence that lower extremity venous disease exists to any significant degree to affect wound healing.  Little change from her examination from last year.  I reviewed the findings with the patient and her accompanying family members.  She seemed to understand.  She plans to continue using Ace bandage wraps around her lower extremities.  We reviewed the importance of graduated compression to avoid venous stasis from tourniquet affect.    Thank you for this interesting consult.  I greatly enjoyed meeting Autumn Carter and look forward to participating in their care.  A copy of this report was sent to the requesting provider on this date.  Electronically Signed: Rickard Rhymes, MD 09/26/2017, 11:00 AM   I spent a total of    25 Minutes in face to face in clinical consultation, greater than 50% of which was counseling/coordinating care for lower extremity veins.

## 2017-10-07 DIAGNOSIS — M5417 Radiculopathy, lumbosacral region: Secondary | ICD-10-CM | POA: Diagnosis not present

## 2017-10-07 DIAGNOSIS — M62838 Other muscle spasm: Secondary | ICD-10-CM | POA: Diagnosis not present

## 2017-10-07 DIAGNOSIS — M546 Pain in thoracic spine: Secondary | ICD-10-CM | POA: Diagnosis not present

## 2017-10-07 DIAGNOSIS — M461 Sacroiliitis, not elsewhere classified: Secondary | ICD-10-CM | POA: Diagnosis not present

## 2017-10-09 DIAGNOSIS — M545 Low back pain: Secondary | ICD-10-CM | POA: Diagnosis not present

## 2017-10-09 DIAGNOSIS — M6281 Muscle weakness (generalized): Secondary | ICD-10-CM | POA: Diagnosis not present

## 2017-10-14 DIAGNOSIS — M545 Low back pain: Secondary | ICD-10-CM | POA: Diagnosis not present

## 2017-10-14 DIAGNOSIS — M6281 Muscle weakness (generalized): Secondary | ICD-10-CM | POA: Diagnosis not present

## 2017-10-21 DIAGNOSIS — M545 Low back pain: Secondary | ICD-10-CM | POA: Diagnosis not present

## 2017-10-21 DIAGNOSIS — M6281 Muscle weakness (generalized): Secondary | ICD-10-CM | POA: Diagnosis not present

## 2017-10-27 DIAGNOSIS — R51 Headache: Secondary | ICD-10-CM | POA: Diagnosis not present

## 2017-10-27 DIAGNOSIS — M5412 Radiculopathy, cervical region: Secondary | ICD-10-CM | POA: Diagnosis not present

## 2017-10-27 DIAGNOSIS — M25511 Pain in right shoulder: Secondary | ICD-10-CM | POA: Diagnosis not present

## 2017-10-27 DIAGNOSIS — M545 Low back pain: Secondary | ICD-10-CM | POA: Diagnosis not present

## 2017-10-28 DIAGNOSIS — M545 Low back pain: Secondary | ICD-10-CM | POA: Diagnosis not present

## 2017-10-28 DIAGNOSIS — M6281 Muscle weakness (generalized): Secondary | ICD-10-CM | POA: Diagnosis not present

## 2017-10-30 DIAGNOSIS — M6281 Muscle weakness (generalized): Secondary | ICD-10-CM | POA: Diagnosis not present

## 2017-10-30 DIAGNOSIS — M545 Low back pain: Secondary | ICD-10-CM | POA: Diagnosis not present

## 2017-11-03 ENCOUNTER — Ambulatory Visit (INDEPENDENT_AMBULATORY_CARE_PROVIDER_SITE_OTHER): Payer: BLUE CROSS/BLUE SHIELD | Admitting: Allergy and Immunology

## 2017-11-03 ENCOUNTER — Encounter: Payer: Self-pay | Admitting: Allergy and Immunology

## 2017-11-03 VITALS — BP 150/94 | HR 88 | Resp 20

## 2017-11-03 DIAGNOSIS — J449 Chronic obstructive pulmonary disease, unspecified: Secondary | ICD-10-CM | POA: Diagnosis not present

## 2017-11-03 DIAGNOSIS — K219 Gastro-esophageal reflux disease without esophagitis: Secondary | ICD-10-CM | POA: Diagnosis not present

## 2017-11-03 DIAGNOSIS — J3089 Other allergic rhinitis: Secondary | ICD-10-CM | POA: Diagnosis not present

## 2017-11-03 MED ORDER — MOMETASONE FUROATE 220 MCG/INH IN AEPB: INHALATION_SPRAY | RESPIRATORY_TRACT | 5 refills | Status: DC

## 2017-11-03 NOTE — Patient Instructions (Addendum)
  1. Continue to Treat inflammation:   A. Asmanex 220 Twisthaler 1 inhalations one time per day  B. OTC Rhinocort one spray each nostril one time per day  2. Continue to Treat reflux:   A. Continue to minimize all caffeine and chocolate use  B. continue Dexilant 60 mg in the morning  3. Continue ProAir HFA and antihistamine if needed  4. Return to clinic in 6 months or earlier if problem  5. Obtain fall flu vaccine

## 2017-11-03 NOTE — Progress Notes (Signed)
Follow-up Note  Referring Provider: Ernestene Kiel, MD Primary Provider: Ernestene Kiel, MD Date of Office Visit: 11/03/2017  Subjective:   Autumn Carter (DOB: November 11, 1955) is a 62 y.o. female who returns to the [facility] on 11/03/2017 in re-evaluation of the following:  HPI: Autumn Carter returns to this clinic in reevaluation of her obstructive lung disease with a component of asthma, allergic rhinitis, and reflux induced respiratory disease.  I have not seen her in this clinic since 19 July 2016.  She states that she knows what was causing a lot of her problems with both her heart and her lungs and to some degree her GI tract.  It was the Arnuity which "poisoned" her while she was using it.  She used several other inhaled steroids and apparently had multiple side effects once again associated with some heart symptoms and some GI symptoms.  She is now using Asmanex Twisthaler 220 -1 inhalation once a day and she thinks that everything is going quite well.  She rarely has any significant respiratory tract symptoms and she rarely uses a short acting bronchodilator.  She had a traumatic injury to both legs after slipping on yogurt at [facility] about 3 months ago and while at home undergoing rehab she really did very well but since she has been back to work for the past 3 to 4 weeks she has had a lot more stress and anxiety and she thinks that her breathing has actually gotten a little bit worse since that point in time.  She has had to use her bronchodilator just a few times per week.  She apparently does not have any nocturnal bronchospastic symptoms.  Her nose is doing quite well while using Rhinocort on a pretty regular basis.  Her reflux is going very well at this point time will consistently using Dexilant.  Allergies as of 11/03/2017      Reactions   Ace Inhibitors Swelling   Amlodipine Other (See Comments)   Abdominal pain.   Amoxicillin    Arnuity Ellipta [fluticasone  Propionate (inhal)] Other (See Comments)   Eye issues   Benicar Hct [olmesartan Medoxomil-hctz] Swelling   Tongue and Lip swelling   Beta Adrenergic Blockers    Eye Infections.    Cephalexin    Cephalosporins Other (See Comments)   Unknown   Clindamycin/lincomycin    Codeine Sulfate    Doxazosin Other (See Comments)   pain   Hydralazine Hcl    Hydrochlorothiazide    Lansoprazole    Levaquin [levofloxacin In D5w]    Macrobid [nitrofurantoin Monohyd Macro]    Metoprolol Tartrate    Morphine And Related Other (See Comments)   hallucinations     Naltrexone Nausea Only   Nasonex [mometasone Furoate]    Nifedipine Other (See Comments)   Headache   Olmesartan    Olmesartan Medoxomil: trembling, stomach upset   Propoxyphene Other (See Comments)   Unknown   Qvar [beclomethasone] Nausea Only, Other (See Comments)   Runny nose   Ranitidine Itching, Swelling   Singulair [montelukast Sodium] Other (See Comments)   heartburn   Spironolactone Other (See Comments)   Worsened GERD   Sulfa Antibiotics    Zestril [lisinopril]       Medication List      ammonium lactate 12 % lotion Commonly known as:  LAC-HYDRIN Apply 1 application topically as needed for dry skin.   ARNUITY ELLIPTA 200 MCG/ACT Aepb Generic drug:  Fluticasone Furoate INHALE 1 PUFF INTO THE  LUNGS DAILY.   ASMANEX (60 METERED DOSES) 220 MCG/INH inhaler Generic drug:  mometasone INHALE 2 PUFFS IN THE EVENING ONCE A DAY   cloNIDine 0.2 mg/24hr patch Commonly known as:  CATAPRES - Dosed in mg/24 hr APPLY 1 PATCH TO SKIN ONCE PER WEEK TRANSDERMAL 90 DAYS   DEXILANT 60 MG capsule Generic drug:  dexlansoprazole Take 60 mg by mouth daily.   diazepam 5 MG tablet Commonly known as:  VALIUM Take 2.5-5 mg by mouth 2 (two) times daily as needed for anxiety (sleep).   HYDROcodone-acetaminophen 5-325 MG tablet Commonly known as:  NORCO/VICODIN Take 0.5 tablets by mouth every 6 (six) hours as needed for moderate  pain.   losartan 50 MG tablet Commonly known as:  COZAAR Take 50 mg by mouth 2 (two) times daily.   PROAIR HFA IN Inhale into the lungs as needed.   PROBIOTIC PO Take by mouth daily.   RHINOCORT ALLERGY 32 MCG/ACT nasal spray Generic drug:  budesonide Place 1 spray into both nostrils daily.   tiZANidine 2 MG tablet Commonly known as:  ZANAFLEX Take 2 mg by mouth every 8 (eight) hours as needed for muscle spasms.       Past Medical History:  Diagnosis Date  . Allergic rhinitis   . Asthma   . Dry eye   . GERD (gastroesophageal reflux disease)   . History of breast cancer     History reviewed. No pertinent surgical history.  Review of systems negative except as noted in HPI / PMHx or noted below:  Review of Systems  Constitutional: Negative.   HENT: Negative.   Eyes: Negative.   Respiratory: Negative.   Cardiovascular: Negative.   Gastrointestinal: Negative.   Genitourinary: Negative.   Musculoskeletal: Negative.   Skin: Negative.   Neurological: Negative.   Endo/Heme/Allergies: Negative.   Psychiatric/Behavioral: Negative.      Objective:   Vitals:   11/03/17 1737 11/03/17 1802  BP: (!) 162/110 (!) 150/94  Pulse: 88   Resp: 20   SpO2: 97%           Physical Exam  HENT:  Head: Normocephalic.  Right Ear: Tympanic membrane, external ear and ear canal normal.  Left Ear: Tympanic membrane, external ear and ear canal normal.  Nose: Nose normal. No mucosal edema or rhinorrhea.  Mouth/Throat: Uvula is midline, oropharynx is clear and moist and mucous membranes are normal. No oropharyngeal exudate.  Eyes: Conjunctivae are normal.  Neck: Trachea normal. No tracheal tenderness present. No tracheal deviation present. No thyromegaly present.  Cardiovascular: Normal rate, regular rhythm, S1 normal, S2 normal and normal heart sounds.  No murmur heard. Pulmonary/Chest: Breath sounds normal. No stridor. No respiratory distress. She has no wheezes. She has no  rales.  Musculoskeletal: She exhibits no edema.  Lymphadenopathy:       Head (right side): No tonsillar adenopathy present.       Head (left side): No tonsillar adenopathy present.    She has no cervical adenopathy.  Neurological: She is alert.  Skin: No rash noted. She is not diaphoretic. No erythema. Nails show no clubbing.    Diagnostics:    Spirometry was performed and demonstrated an FEV1 of 1.30 at 60 % of predicted.  The patient had an Asthma Control Test with the following results: ACT Total Score: 13.    Assessment and Plan:   1. COPD with asthma ([place])   2. Other allergic rhinitis   3. Gastroesophageal reflux disease, esophagitis presence not specified  1. Continue to Treat inflammation:   A. Asmanex 220 Twisthaler 1 inhalations one time per day  B. OTC Rhinocort one spray each nostril one time per day  2. Continue to Treat reflux:   A. Continue to minimize all caffeine and chocolate use  B. continue Dexilant 60 mg in the morning  3. Continue ProAir HFA and antihistamine if needed  4. Return to clinic in 6 months or earlier if problem  5. Obtain fall flu vaccine  At this point Jennetta appears to be doing relatively well on her current therapy which includes inhaled mometasone and nasal budesonide and a proton pump inhibitor to address both her respiratory tract inflammatory condition and her reflux.  I will attempt to get insurance approval for Asmanex as she has failed all other inhaled steroids secondary to various side effects.  If she does well I will see her back in this clinic in 6 months or earlier if there is a problem.  Allena Katz, MD [facility]

## 2017-11-04 ENCOUNTER — Encounter: Payer: Self-pay | Admitting: Allergy and Immunology

## 2017-11-05 DIAGNOSIS — M545 Low back pain: Secondary | ICD-10-CM | POA: Diagnosis not present

## 2017-11-05 DIAGNOSIS — M6281 Muscle weakness (generalized): Secondary | ICD-10-CM | POA: Diagnosis not present

## 2017-11-07 DIAGNOSIS — M6281 Muscle weakness (generalized): Secondary | ICD-10-CM | POA: Diagnosis not present

## 2017-11-07 DIAGNOSIS — M545 Low back pain: Secondary | ICD-10-CM | POA: Diagnosis not present

## 2017-11-10 DIAGNOSIS — M545 Low back pain: Secondary | ICD-10-CM | POA: Diagnosis not present

## 2017-11-10 DIAGNOSIS — M6281 Muscle weakness (generalized): Secondary | ICD-10-CM | POA: Diagnosis not present

## 2017-11-12 DIAGNOSIS — M6281 Muscle weakness (generalized): Secondary | ICD-10-CM | POA: Diagnosis not present

## 2017-11-12 DIAGNOSIS — M545 Low back pain: Secondary | ICD-10-CM | POA: Diagnosis not present

## 2017-11-19 DIAGNOSIS — M546 Pain in thoracic spine: Secondary | ICD-10-CM | POA: Diagnosis not present

## 2017-11-19 DIAGNOSIS — M545 Low back pain: Secondary | ICD-10-CM | POA: Diagnosis not present

## 2017-11-19 DIAGNOSIS — M62838 Other muscle spasm: Secondary | ICD-10-CM | POA: Diagnosis not present

## 2017-11-19 DIAGNOSIS — M6281 Muscle weakness (generalized): Secondary | ICD-10-CM | POA: Diagnosis not present

## 2017-11-19 DIAGNOSIS — M5442 Lumbago with sciatica, left side: Secondary | ICD-10-CM | POA: Diagnosis not present

## 2017-11-19 DIAGNOSIS — M461 Sacroiliitis, not elsewhere classified: Secondary | ICD-10-CM | POA: Diagnosis not present

## 2017-11-24 DIAGNOSIS — M549 Dorsalgia, unspecified: Secondary | ICD-10-CM | POA: Diagnosis not present

## 2017-11-24 DIAGNOSIS — R5383 Other fatigue: Secondary | ICD-10-CM | POA: Diagnosis not present

## 2017-11-24 DIAGNOSIS — M5030 Other cervical disc degeneration, unspecified cervical region: Secondary | ICD-10-CM | POA: Diagnosis not present

## 2017-11-24 DIAGNOSIS — M542 Cervicalgia: Secondary | ICD-10-CM | POA: Diagnosis not present

## 2017-12-08 DIAGNOSIS — M62838 Other muscle spasm: Secondary | ICD-10-CM | POA: Diagnosis not present

## 2017-12-08 DIAGNOSIS — M25652 Stiffness of left hip, not elsewhere classified: Secondary | ICD-10-CM | POA: Diagnosis not present

## 2017-12-08 DIAGNOSIS — M5442 Lumbago with sciatica, left side: Secondary | ICD-10-CM | POA: Diagnosis not present

## 2017-12-08 DIAGNOSIS — M546 Pain in thoracic spine: Secondary | ICD-10-CM | POA: Diagnosis not present

## 2017-12-11 ENCOUNTER — Other Ambulatory Visit: Payer: Self-pay | Admitting: Allergy and Immunology

## 2017-12-15 ENCOUNTER — Other Ambulatory Visit: Payer: Self-pay | Admitting: Internal Medicine

## 2017-12-15 DIAGNOSIS — M542 Cervicalgia: Secondary | ICD-10-CM

## 2017-12-15 DIAGNOSIS — M5030 Other cervical disc degeneration, unspecified cervical region: Secondary | ICD-10-CM

## 2017-12-18 DIAGNOSIS — R110 Nausea: Secondary | ICD-10-CM | POA: Diagnosis not present

## 2017-12-18 DIAGNOSIS — T753XXA Motion sickness, initial encounter: Secondary | ICD-10-CM | POA: Diagnosis not present

## 2017-12-18 DIAGNOSIS — D692 Other nonthrombocytopenic purpura: Secondary | ICD-10-CM | POA: Diagnosis not present

## 2017-12-18 DIAGNOSIS — I10 Essential (primary) hypertension: Secondary | ICD-10-CM | POA: Diagnosis not present

## 2017-12-28 ENCOUNTER — Ambulatory Visit
Admission: RE | Admit: 2017-12-28 | Discharge: 2017-12-28 | Disposition: A | Discharge status: Home / Self Care | Payer: BLUE CROSS/BLUE SHIELD | Source: Ambulatory Visit | Attending: Internal Medicine | Admitting: Internal Medicine

## 2017-12-28 DIAGNOSIS — M542 Cervicalgia: Secondary | ICD-10-CM

## 2017-12-28 DIAGNOSIS — M4802 Spinal stenosis, cervical region: Secondary | ICD-10-CM | POA: Diagnosis not present

## 2017-12-28 DIAGNOSIS — M5030 Other cervical disc degeneration, unspecified cervical region: Secondary | ICD-10-CM

## 2018-01-01 DIAGNOSIS — W19XXXA Unspecified fall, initial encounter: Secondary | ICD-10-CM | POA: Diagnosis not present

## 2018-01-01 DIAGNOSIS — Z79899 Other long term (current) drug therapy: Secondary | ICD-10-CM | POA: Diagnosis not present

## 2018-01-01 DIAGNOSIS — I10 Essential (primary) hypertension: Secondary | ICD-10-CM | POA: Diagnosis not present

## 2018-01-01 DIAGNOSIS — J45909 Unspecified asthma, uncomplicated: Secondary | ICD-10-CM | POA: Diagnosis not present

## 2018-01-01 DIAGNOSIS — S81812A Laceration without foreign body, left lower leg, initial encounter: Secondary | ICD-10-CM | POA: Diagnosis not present

## 2018-01-06 DIAGNOSIS — T8189XA Other complications of procedures, not elsewhere classified, initial encounter: Secondary | ICD-10-CM | POA: Diagnosis not present

## 2018-01-06 DIAGNOSIS — Z923 Personal history of irradiation: Secondary | ICD-10-CM | POA: Diagnosis not present

## 2018-01-06 DIAGNOSIS — X58XXXA Exposure to other specified factors, initial encounter: Secondary | ICD-10-CM | POA: Diagnosis not present

## 2018-01-06 DIAGNOSIS — S81802A Unspecified open wound, left lower leg, initial encounter: Secondary | ICD-10-CM | POA: Diagnosis not present

## 2018-01-06 DIAGNOSIS — M109 Gout, unspecified: Secondary | ICD-10-CM | POA: Diagnosis not present

## 2018-01-06 DIAGNOSIS — M1990 Unspecified osteoarthritis, unspecified site: Secondary | ICD-10-CM | POA: Diagnosis not present

## 2018-01-06 DIAGNOSIS — J45909 Unspecified asthma, uncomplicated: Secondary | ICD-10-CM | POA: Diagnosis not present

## 2018-01-06 DIAGNOSIS — Z853 Personal history of malignant neoplasm of breast: Secondary | ICD-10-CM | POA: Diagnosis not present

## 2018-01-06 DIAGNOSIS — I10 Essential (primary) hypertension: Secondary | ICD-10-CM | POA: Diagnosis not present

## 2018-01-07 DIAGNOSIS — L03116 Cellulitis of left lower limb: Secondary | ICD-10-CM | POA: Diagnosis not present

## 2018-01-07 DIAGNOSIS — I10 Essential (primary) hypertension: Secondary | ICD-10-CM | POA: Diagnosis not present

## 2018-01-07 DIAGNOSIS — J4520 Mild intermittent asthma, uncomplicated: Secondary | ICD-10-CM | POA: Diagnosis not present

## 2018-01-07 DIAGNOSIS — M4802 Spinal stenosis, cervical region: Secondary | ICD-10-CM | POA: Diagnosis not present

## 2018-01-09 DIAGNOSIS — M5021 Other cervical disc displacement,  high cervical region: Secondary | ICD-10-CM | POA: Diagnosis not present

## 2018-01-09 DIAGNOSIS — M62838 Other muscle spasm: Secondary | ICD-10-CM | POA: Diagnosis not present

## 2018-01-09 DIAGNOSIS — M50122 Cervical disc disorder at C5-C6 level with radiculopathy: Secondary | ICD-10-CM | POA: Diagnosis not present

## 2018-01-12 DIAGNOSIS — S81802A Unspecified open wound, left lower leg, initial encounter: Secondary | ICD-10-CM | POA: Diagnosis not present

## 2018-01-12 DIAGNOSIS — X58XXXA Exposure to other specified factors, initial encounter: Secondary | ICD-10-CM | POA: Diagnosis not present

## 2018-01-12 DIAGNOSIS — L97822 Non-pressure chronic ulcer of other part of left lower leg with fat layer exposed: Secondary | ICD-10-CM | POA: Diagnosis not present

## 2018-01-14 DIAGNOSIS — B370 Candidal stomatitis: Secondary | ICD-10-CM | POA: Diagnosis not present

## 2018-01-14 DIAGNOSIS — F419 Anxiety disorder, unspecified: Secondary | ICD-10-CM | POA: Diagnosis not present

## 2018-01-14 DIAGNOSIS — I10 Essential (primary) hypertension: Secondary | ICD-10-CM | POA: Diagnosis not present

## 2018-01-14 DIAGNOSIS — M47816 Spondylosis without myelopathy or radiculopathy, lumbar region: Secondary | ICD-10-CM | POA: Diagnosis not present

## 2018-01-19 DIAGNOSIS — S81812A Laceration without foreign body, left lower leg, initial encounter: Secondary | ICD-10-CM | POA: Diagnosis not present

## 2018-01-19 DIAGNOSIS — S81802A Unspecified open wound, left lower leg, initial encounter: Secondary | ICD-10-CM | POA: Diagnosis not present

## 2018-01-19 DIAGNOSIS — X58XXXA Exposure to other specified factors, initial encounter: Secondary | ICD-10-CM | POA: Diagnosis not present

## 2018-01-21 DIAGNOSIS — M25552 Pain in left hip: Secondary | ICD-10-CM | POA: Diagnosis not present

## 2018-01-21 DIAGNOSIS — R1032 Left lower quadrant pain: Secondary | ICD-10-CM | POA: Diagnosis not present

## 2018-01-22 DIAGNOSIS — M25561 Pain in right knee: Secondary | ICD-10-CM | POA: Diagnosis not present

## 2018-01-22 DIAGNOSIS — I10 Essential (primary) hypertension: Secondary | ICD-10-CM | POA: Diagnosis not present

## 2018-01-22 DIAGNOSIS — M5021 Other cervical disc displacement,  high cervical region: Secondary | ICD-10-CM | POA: Diagnosis not present

## 2018-01-22 DIAGNOSIS — M62838 Other muscle spasm: Secondary | ICD-10-CM | POA: Diagnosis not present

## 2018-01-22 DIAGNOSIS — M50122 Cervical disc disorder at C5-C6 level with radiculopathy: Secondary | ICD-10-CM | POA: Diagnosis not present

## 2018-01-22 DIAGNOSIS — R197 Diarrhea, unspecified: Secondary | ICD-10-CM | POA: Diagnosis not present

## 2018-01-22 DIAGNOSIS — M47816 Spondylosis without myelopathy or radiculopathy, lumbar region: Secondary | ICD-10-CM | POA: Diagnosis not present

## 2018-01-22 DIAGNOSIS — R1032 Left lower quadrant pain: Secondary | ICD-10-CM | POA: Diagnosis not present

## 2018-01-26 DIAGNOSIS — T8189XA Other complications of procedures, not elsewhere classified, initial encounter: Secondary | ICD-10-CM | POA: Diagnosis not present

## 2018-01-26 DIAGNOSIS — S81802A Unspecified open wound, left lower leg, initial encounter: Secondary | ICD-10-CM | POA: Diagnosis not present

## 2018-01-26 DIAGNOSIS — X58XXXA Exposure to other specified factors, initial encounter: Secondary | ICD-10-CM | POA: Diagnosis not present

## 2018-02-03 DIAGNOSIS — R109 Unspecified abdominal pain: Secondary | ICD-10-CM | POA: Diagnosis not present

## 2018-02-03 DIAGNOSIS — Z87440 Personal history of urinary (tract) infections: Secondary | ICD-10-CM | POA: Diagnosis not present

## 2018-02-03 DIAGNOSIS — Z6833 Body mass index (BMI) 33.0-33.9, adult: Secondary | ICD-10-CM | POA: Diagnosis not present

## 2018-02-03 DIAGNOSIS — X58XXXA Exposure to other specified factors, initial encounter: Secondary | ICD-10-CM | POA: Diagnosis not present

## 2018-02-03 DIAGNOSIS — S81811A Laceration without foreign body, right lower leg, initial encounter: Secondary | ICD-10-CM | POA: Diagnosis not present

## 2018-02-03 DIAGNOSIS — S81802A Unspecified open wound, left lower leg, initial encounter: Secondary | ICD-10-CM | POA: Diagnosis not present

## 2018-02-16 DIAGNOSIS — X58XXXA Exposure to other specified factors, initial encounter: Secondary | ICD-10-CM | POA: Diagnosis not present

## 2018-02-16 DIAGNOSIS — M5412 Radiculopathy, cervical region: Secondary | ICD-10-CM | POA: Diagnosis not present

## 2018-02-16 DIAGNOSIS — M62838 Other muscle spasm: Secondary | ICD-10-CM | POA: Diagnosis not present

## 2018-02-16 DIAGNOSIS — M25612 Stiffness of left shoulder, not elsewhere classified: Secondary | ICD-10-CM | POA: Diagnosis not present

## 2018-02-16 DIAGNOSIS — S81812A Laceration without foreign body, left lower leg, initial encounter: Secondary | ICD-10-CM | POA: Diagnosis not present

## 2018-02-16 DIAGNOSIS — M25611 Stiffness of right shoulder, not elsewhere classified: Secondary | ICD-10-CM | POA: Diagnosis not present

## 2018-02-16 DIAGNOSIS — S81802A Unspecified open wound, left lower leg, initial encounter: Secondary | ICD-10-CM | POA: Diagnosis not present

## 2018-02-19 DIAGNOSIS — S81812A Laceration without foreign body, left lower leg, initial encounter: Secondary | ICD-10-CM | POA: Diagnosis not present

## 2018-02-19 DIAGNOSIS — Z6833 Body mass index (BMI) 33.0-33.9, adult: Secondary | ICD-10-CM | POA: Diagnosis not present

## 2018-02-19 DIAGNOSIS — I10 Essential (primary) hypertension: Secondary | ICD-10-CM | POA: Diagnosis not present

## 2018-02-19 DIAGNOSIS — M79605 Pain in left leg: Secondary | ICD-10-CM | POA: Diagnosis not present

## 2018-03-02 DIAGNOSIS — S81802A Unspecified open wound, left lower leg, initial encounter: Secondary | ICD-10-CM | POA: Diagnosis not present

## 2018-03-02 DIAGNOSIS — R300 Dysuria: Secondary | ICD-10-CM | POA: Diagnosis not present

## 2018-03-02 DIAGNOSIS — Z6833 Body mass index (BMI) 33.0-33.9, adult: Secondary | ICD-10-CM | POA: Diagnosis not present

## 2018-03-02 DIAGNOSIS — R51 Headache: Secondary | ICD-10-CM | POA: Diagnosis not present

## 2018-03-03 DIAGNOSIS — M62838 Other muscle spasm: Secondary | ICD-10-CM | POA: Diagnosis not present

## 2018-03-03 DIAGNOSIS — M25611 Stiffness of right shoulder, not elsewhere classified: Secondary | ICD-10-CM | POA: Diagnosis not present

## 2018-03-03 DIAGNOSIS — M25612 Stiffness of left shoulder, not elsewhere classified: Secondary | ICD-10-CM | POA: Diagnosis not present

## 2018-03-03 DIAGNOSIS — M5412 Radiculopathy, cervical region: Secondary | ICD-10-CM | POA: Diagnosis not present

## 2018-03-04 DIAGNOSIS — L97822 Non-pressure chronic ulcer of other part of left lower leg with fat layer exposed: Secondary | ICD-10-CM | POA: Diagnosis not present

## 2018-03-04 DIAGNOSIS — I872 Venous insufficiency (chronic) (peripheral): Secondary | ICD-10-CM | POA: Diagnosis not present

## 2018-03-04 DIAGNOSIS — S81802A Unspecified open wound, left lower leg, initial encounter: Secondary | ICD-10-CM | POA: Diagnosis not present

## 2018-03-04 DIAGNOSIS — X58XXXA Exposure to other specified factors, initial encounter: Secondary | ICD-10-CM | POA: Diagnosis not present

## 2018-03-04 DIAGNOSIS — I96 Gangrene, not elsewhere classified: Secondary | ICD-10-CM | POA: Diagnosis not present

## 2018-03-11 DIAGNOSIS — X58XXXA Exposure to other specified factors, initial encounter: Secondary | ICD-10-CM | POA: Diagnosis not present

## 2018-03-11 DIAGNOSIS — S81802A Unspecified open wound, left lower leg, initial encounter: Secondary | ICD-10-CM | POA: Diagnosis not present

## 2018-03-11 DIAGNOSIS — I96 Gangrene, not elsewhere classified: Secondary | ICD-10-CM | POA: Diagnosis not present

## 2018-03-11 DIAGNOSIS — L97822 Non-pressure chronic ulcer of other part of left lower leg with fat layer exposed: Secondary | ICD-10-CM | POA: Diagnosis not present

## 2018-03-11 DIAGNOSIS — I872 Venous insufficiency (chronic) (peripheral): Secondary | ICD-10-CM | POA: Diagnosis not present

## 2018-03-17 DIAGNOSIS — M545 Low back pain: Secondary | ICD-10-CM | POA: Diagnosis not present

## 2018-03-17 DIAGNOSIS — M5412 Radiculopathy, cervical region: Secondary | ICD-10-CM | POA: Diagnosis not present

## 2018-03-17 DIAGNOSIS — M62838 Other muscle spasm: Secondary | ICD-10-CM | POA: Diagnosis not present

## 2018-03-18 DIAGNOSIS — T8189XA Other complications of procedures, not elsewhere classified, initial encounter: Secondary | ICD-10-CM | POA: Diagnosis not present

## 2018-03-18 DIAGNOSIS — Z09 Encounter for follow-up examination after completed treatment for conditions other than malignant neoplasm: Secondary | ICD-10-CM | POA: Diagnosis not present

## 2018-03-18 DIAGNOSIS — Z87828 Personal history of other (healed) physical injury and trauma: Secondary | ICD-10-CM | POA: Diagnosis not present

## 2018-03-23 DIAGNOSIS — M47816 Spondylosis without myelopathy or radiculopathy, lumbar region: Secondary | ICD-10-CM | POA: Diagnosis not present

## 2018-03-23 DIAGNOSIS — J4521 Mild intermittent asthma with (acute) exacerbation: Secondary | ICD-10-CM | POA: Diagnosis not present

## 2018-03-23 DIAGNOSIS — I10 Essential (primary) hypertension: Secondary | ICD-10-CM | POA: Diagnosis not present

## 2018-03-23 DIAGNOSIS — J40 Bronchitis, not specified as acute or chronic: Secondary | ICD-10-CM | POA: Diagnosis not present

## 2018-04-01 DIAGNOSIS — Z1231 Encounter for screening mammogram for malignant neoplasm of breast: Secondary | ICD-10-CM | POA: Diagnosis not present

## 2018-04-02 DIAGNOSIS — J4520 Mild intermittent asthma, uncomplicated: Secondary | ICD-10-CM | POA: Diagnosis not present

## 2018-04-13 DIAGNOSIS — M545 Low back pain: Secondary | ICD-10-CM | POA: Diagnosis not present

## 2018-04-13 DIAGNOSIS — M62838 Other muscle spasm: Secondary | ICD-10-CM | POA: Diagnosis not present

## 2018-04-13 DIAGNOSIS — M542 Cervicalgia: Secondary | ICD-10-CM | POA: Diagnosis not present

## 2018-04-13 DIAGNOSIS — J4520 Mild intermittent asthma, uncomplicated: Secondary | ICD-10-CM | POA: Diagnosis not present

## 2018-04-13 DIAGNOSIS — Z6834 Body mass index (BMI) 34.0-34.9, adult: Secondary | ICD-10-CM | POA: Diagnosis not present

## 2018-04-13 DIAGNOSIS — M546 Pain in thoracic spine: Secondary | ICD-10-CM | POA: Diagnosis not present

## 2018-04-13 DIAGNOSIS — K219 Gastro-esophageal reflux disease without esophagitis: Secondary | ICD-10-CM | POA: Diagnosis not present

## 2018-04-13 DIAGNOSIS — R1310 Dysphagia, unspecified: Secondary | ICD-10-CM | POA: Diagnosis not present

## 2018-04-21 DIAGNOSIS — J342 Deviated nasal septum: Secondary | ICD-10-CM | POA: Diagnosis not present

## 2018-04-21 DIAGNOSIS — R0989 Other specified symptoms and signs involving the circulatory and respiratory systems: Secondary | ICD-10-CM | POA: Diagnosis not present

## 2018-04-21 DIAGNOSIS — R1310 Dysphagia, unspecified: Secondary | ICD-10-CM | POA: Diagnosis not present

## 2018-04-21 DIAGNOSIS — K219 Gastro-esophageal reflux disease without esophagitis: Secondary | ICD-10-CM | POA: Diagnosis not present

## 2018-04-28 DIAGNOSIS — R1310 Dysphagia, unspecified: Secondary | ICD-10-CM | POA: Diagnosis not present

## 2018-05-04 ENCOUNTER — Ambulatory Visit: Payer: BLUE CROSS/BLUE SHIELD | Admitting: Allergy and Immunology

## 2018-05-07 DIAGNOSIS — K219 Gastro-esophageal reflux disease without esophagitis: Secondary | ICD-10-CM | POA: Diagnosis not present

## 2018-05-07 DIAGNOSIS — R1310 Dysphagia, unspecified: Secondary | ICD-10-CM | POA: Diagnosis not present

## 2018-05-11 DIAGNOSIS — M545 Low back pain: Secondary | ICD-10-CM | POA: Diagnosis not present

## 2018-05-11 DIAGNOSIS — M50222 Other cervical disc displacement at C5-C6 level: Secondary | ICD-10-CM | POA: Diagnosis not present

## 2018-05-11 DIAGNOSIS — M546 Pain in thoracic spine: Secondary | ICD-10-CM | POA: Diagnosis not present

## 2018-05-25 ENCOUNTER — Ambulatory Visit: Payer: Self-pay | Admitting: Allergy and Immunology

## 2018-06-09 DIAGNOSIS — M50222 Other cervical disc displacement at C5-C6 level: Secondary | ICD-10-CM | POA: Diagnosis not present

## 2018-06-09 DIAGNOSIS — M456 Ankylosing spondylitis lumbar region: Secondary | ICD-10-CM | POA: Diagnosis not present

## 2018-06-22 ENCOUNTER — Other Ambulatory Visit: Payer: Self-pay

## 2018-06-22 ENCOUNTER — Ambulatory Visit (INDEPENDENT_AMBULATORY_CARE_PROVIDER_SITE_OTHER): Payer: BLUE CROSS/BLUE SHIELD | Admitting: Allergy and Immunology

## 2018-06-22 ENCOUNTER — Encounter: Payer: Self-pay | Admitting: Allergy and Immunology

## 2018-06-22 VITALS — BP 146/96 | HR 96 | Resp 16 | Ht 61.5 in | Wt 195.6 lb

## 2018-06-22 DIAGNOSIS — K219 Gastro-esophageal reflux disease without esophagitis: Secondary | ICD-10-CM

## 2018-06-22 DIAGNOSIS — J3089 Other allergic rhinitis: Secondary | ICD-10-CM | POA: Diagnosis not present

## 2018-06-22 DIAGNOSIS — J449 Chronic obstructive pulmonary disease, unspecified: Secondary | ICD-10-CM | POA: Diagnosis not present

## 2018-06-22 NOTE — Progress Notes (Signed)
[place]   Follow-up Note  Referring Provider: Ernestene Kiel, MD Primary Provider: Ernestene Kiel, MD Date of Office Visit: 06/22/2018  Subjective:   Autumn Carter (DOB: April 21, 1955) is a 63 y.o. female who returns to the [facility] on 06/22/2018 in re-evaluation of the following:  HPI: Chareese returns to this clinic in reevaluation of asthma and allergic rhinitis and reflux.  I have not seen her in this clinic since 03 November 2017.  Juleah did relatively well other than a bout of respiratory tract infection involving both upper and lower airways in February 2020 that required a systemic steroid and several antibiotics.  Apparently she missed 3 days of work during that episode.  She does not remember having any high fever.  She did have lots of issues with her throat around that point in time and she went to see ENT, Dr. Gaylyn Cheers, for a "throat bubble" and it turned out that it was reflux.  She had a prolonged recuperation of about 3 weeks or so but really feels pretty well at this point in time.  Prior to and since that event she had done very well with her airway and rarely used a short acting bronchodilator while she continues on Asmanex.  She does not really exercise to any degree.  She believes that her nose has been doing relatively well while using Rhinocort.  Her reflux has been pretty good while using Dexilant.  She has been having some issues with back and neck disc disease and she has been offered surgery for correction which she refuses.  Her lower extremity skin condition appears to be under pretty good control at this point in time while she remains with wraps utilized on a daily basis.  She has retired from work as a 12 Jun 2018.  Allergies as of 06/22/2018      Reactions   Ace Inhibitors Swelling   Amlodipine Other (See Comments)   Abdominal pain.   Amoxicillin    Arnuity Ellipta [fluticasone Propionate  (inhal)] Other (See Comments)   Eye issues   Benicar Hct [olmesartan Medoxomil-hctz] Swelling   Tongue and Lip swelling   Beta Adrenergic Blockers    Eye Infections.    Cephalexin    Cephalosporins Other (See Comments)   Unknown   Clindamycin/lincomycin    Codeine Sulfate    Doxazosin Other (See Comments)   pain   Hydralazine Hcl    Hydrochlorothiazide    Lansoprazole    Levaquin [levofloxacin In D5w]    Macrobid [nitrofurantoin Monohyd Macro]    Metoprolol Tartrate    Morphine And Related Other (See Comments)   hallucinations     Naltrexone Nausea Only   Nasonex [mometasone Furoate]    Nifedipine Other (See Comments)   Headache   Olmesartan    Olmesartan Medoxomil: trembling, stomach upset   Propoxyphene Other (See Comments)   Unknown   Qvar [beclomethasone] Nausea Only, Other (See Comments)   Runny nose   Ranitidine Itching, Swelling   Singulair [montelukast Sodium] Other (See Comments)   heartburn   Spironolactone Other (See Comments)   Worsened GERD   Sulfa Antibiotics    Zestril [lisinopril]       Medication List    ammonium lactate 12 % lotion Commonly known as:  LAC-HYDRIN Apply 1 application topically as needed for dry skin.   Asmanex (60 Metered Doses) 220 MCG/INH inhaler Generic drug:  mometasone INHALE 2 PUFFS IN THE EVENING  ONCE A DAY   mometasone 220 MCG/INH inhaler Commonly known as:  Asmanex (30 Metered Doses) INHALE ONE DOSE ONCE DAILY TO PREVENT COUGH OR WHEEZE **RINSE, GARGLE, AND SPIT AFTER USE**   cloNIDine 0.2 mg/24hr patch Commonly known as:  CATAPRES - Dosed in mg/24 hr APPLY 1 PATCH TO SKIN ONCE PER WEEK TRANSDERMAL 90 DAYS   Dexilant 60 MG capsule Generic drug:  dexlansoprazole Take 60 mg by mouth daily.   diazepam 5 MG tablet Commonly known as:  VALIUM Take 2.5-5 mg by mouth 2 (two) times daily as needed for anxiety (sleep).   diclofenac sodium 1 % Gel Commonly known as:  VOLTAREN Apply topically.   levalbuterol 1.25  MG/3ML nebulizer solution Commonly known as:  XOPENEX INHALE 3MLS EVERY 8 HOURS   nystatin 100000 UNIT/ML suspension Commonly known as:  MYCOSTATIN SWISH AND SPIT 5 MLS FOUR TIMES A DAY FOR MOUTH/THROAT FOR 7 DAYS   ondansetron 4 MG disintegrating tablet Commonly known as:  ZOFRAN-ODT Take 4 mg by mouth 3 (three) times daily as needed for nausea or vomiting.   oxyCODONE 5 MG immediate release tablet Commonly known as:  Oxy IR/ROXICODONE   PROAIR HFA IN Inhale into the lungs as needed.   PROBIOTIC PO Take by mouth daily.   Rhinocort Allergy 32 MCG/ACT nasal spray Generic drug:  budesonide Place 1 spray into both nostrils daily.   tiZANidine 2 MG tablet Commonly known as:  ZANAFLEX Take 2 mg by mouth every 8 (eight) hours as needed for muscle spasms.   traMADol 50 MG tablet Commonly known as:  ULTRAM TAKE 1 TABLET BY MOUTH EVERY 6 HOURS AS NEEDED FOR 5 DAYS   valsartan 160 MG tablet Commonly known as:  DIOVAN Take 160 mg by mouth 2 (two) times daily.   Vitamin D (Cholecalciferol) 25 MCG (1000 UT) Tabs Take by mouth daily.       Past Medical History:  Diagnosis Date  . Allergic rhinitis   . Asthma   . Dry eye   . GERD (gastroesophageal reflux disease)   . History of breast cancer     History reviewed. No pertinent surgical history.  Review of systems negative except as noted in HPI / PMHx or noted below:  Review of Systems  Constitutional: Negative.   HENT: Negative.   Eyes: Negative.   Respiratory: Negative.   Cardiovascular: Negative.   Gastrointestinal: Negative.   Genitourinary: Negative.   Musculoskeletal: Negative.   Skin: Negative.   Neurological: Negative.   Endo/Heme/Allergies: Negative.   Psychiatric/Behavioral: Negative.      Objective:   Vitals:   06/22/18 1653  BP: (!) 146/96  Pulse: 96  Resp: 16  SpO2: 97%   Height: 5' 1.5" (156.2 cm)  Weight: 195 lb 9.6 oz (88.7 kg)   Physical Exam Constitutional:      Appearance: She  is not diaphoretic.  HENT:     Head: Normocephalic.     Right Ear: Tympanic membrane, ear canal and external ear normal.     Left Ear: Tympanic membrane, ear canal and external ear normal.     Nose: Nose normal. No mucosal edema or rhinorrhea.     Mouth/Throat:     Pharynx: Uvula midline. No oropharyngeal exudate.  Eyes:     Conjunctiva/sclera: Conjunctivae normal.  Neck:     Thyroid: No thyromegaly.     Trachea: Trachea normal. No tracheal tenderness or tracheal deviation.  Cardiovascular:     Rate and Rhythm: Normal rate and regular rhythm.  Heart sounds: Normal heart sounds, S1 normal and S2 normal. No murmur.  Pulmonary:     Effort: No respiratory distress.     Breath sounds: Normal breath sounds. No stridor. No wheezing or rales.  Lymphadenopathy:     Head:     Right side of head: No tonsillar adenopathy.     Left side of head: No tonsillar adenopathy.     Cervical: No cervical adenopathy.  Skin:    Findings: No erythema. Rash: Multiple ecchymosis involving upper extremities.     Nails: There is no clubbing.   Neurological:     Mental Status: She is alert.     Diagnostics:    Spirometry was performed and demonstrated an FEV1 of 1.39 at 62 % of predicted.  Assessment and Plan:   1. COPD with asthma ([place])   2. Other allergic rhinitis   3. LPRD (laryngopharyngeal reflux disease)     1. Continue to Treat inflammation:   A. Asmanex 220 Twisthaler 1 inhalations one time per day  B. OTC Rhinocort one spray each nostril one time per day  2. Continue to Treat reflux:   A. Dexilant 60 mg in the morning  3. Continue ProAir HFA and antihistamine if needed  4. Return to clinic in 6 months or earlier if problem   Breindel appears to be doing relatively well on a combination of anti-inflammatory medications for both her upper and lower airway and therapy directed against reflux and hopefully this plan will maintain good control of her respiratory tract disease over the  course of the next 6 months.  I will see her back in this clinic at that point in time or earlier if there is a problem.  Allena Katz, MD Allergy / Immunology [place]

## 2018-06-22 NOTE — Patient Instructions (Addendum)
  1. Continue to Treat inflammation:   A. Asmanex 220 Twisthaler 1 inhalations one time per day  B. OTC Rhinocort one spray each nostril one time per day  2. Continue to Treat reflux:   A. Dexilant 60 mg in the morning  3. Continue ProAir HFA and antihistamine if needed  4. Return to clinic in 6 months or earlier if problem

## 2018-06-23 ENCOUNTER — Encounter: Payer: Self-pay | Admitting: Allergy and Immunology

## 2018-07-07 DIAGNOSIS — M542 Cervicalgia: Secondary | ICD-10-CM | POA: Diagnosis not present

## 2018-07-07 DIAGNOSIS — M546 Pain in thoracic spine: Secondary | ICD-10-CM | POA: Diagnosis not present

## 2018-07-07 DIAGNOSIS — M25571 Pain in right ankle and joints of right foot: Secondary | ICD-10-CM | POA: Diagnosis not present

## 2018-07-07 DIAGNOSIS — M461 Sacroiliitis, not elsewhere classified: Secondary | ICD-10-CM | POA: Diagnosis not present

## 2018-07-16 ENCOUNTER — Other Ambulatory Visit: Payer: Self-pay

## 2018-07-16 ENCOUNTER — Telehealth: Payer: Self-pay | Admitting: *Deleted

## 2018-07-16 MED ORDER — HALOBETASOL PROPIONATE 0.05 % EX CREA: TOPICAL_CREAM | CUTANEOUS | 0 refills | Status: DC

## 2018-07-16 NOTE — Telephone Encounter (Signed)
Please inform patient that we can try Ultravate cream applied twice a day to swollen area until resolved

## 2018-07-16 NOTE — Telephone Encounter (Signed)
Patient informed and erx sent to [facility] in [place] as requested.

## 2018-07-16 NOTE — Telephone Encounter (Signed)
Autumn Carter states that every time she gets bitten by mosquitos she has large red areas that last for several weeks. She is wondering if you can call a topical agent in for her to use as needed. Please advise.

## 2018-07-21 DIAGNOSIS — Z79899 Other long term (current) drug therapy: Secondary | ICD-10-CM | POA: Diagnosis not present

## 2018-07-21 DIAGNOSIS — Z1159 Encounter for screening for other viral diseases: Secondary | ICD-10-CM | POA: Diagnosis not present

## 2018-07-21 DIAGNOSIS — N390 Urinary tract infection, site not specified: Secondary | ICD-10-CM | POA: Diagnosis not present

## 2018-07-21 DIAGNOSIS — J4520 Mild intermittent asthma, uncomplicated: Secondary | ICD-10-CM | POA: Diagnosis not present

## 2018-07-21 DIAGNOSIS — I10 Essential (primary) hypertension: Secondary | ICD-10-CM | POA: Diagnosis not present

## 2018-07-21 DIAGNOSIS — K219 Gastro-esophageal reflux disease without esophagitis: Secondary | ICD-10-CM | POA: Diagnosis not present

## 2018-07-21 DIAGNOSIS — Z853 Personal history of malignant neoplasm of breast: Secondary | ICD-10-CM | POA: Diagnosis not present

## 2018-08-24 DIAGNOSIS — M542 Cervicalgia: Secondary | ICD-10-CM | POA: Diagnosis not present

## 2018-08-24 DIAGNOSIS — M461 Sacroiliitis, not elsewhere classified: Secondary | ICD-10-CM | POA: Diagnosis not present

## 2018-08-24 DIAGNOSIS — M25561 Pain in right knee: Secondary | ICD-10-CM | POA: Diagnosis not present

## 2018-08-24 DIAGNOSIS — M546 Pain in thoracic spine: Secondary | ICD-10-CM | POA: Diagnosis not present

## 2018-09-02 DIAGNOSIS — C50219 Malignant neoplasm of upper-inner quadrant of unspecified female breast: Secondary | ICD-10-CM | POA: Diagnosis not present

## 2018-09-02 DIAGNOSIS — Z86000 Personal history of in-situ neoplasm of breast: Secondary | ICD-10-CM | POA: Diagnosis not present

## 2018-09-03 DIAGNOSIS — M545 Low back pain: Secondary | ICD-10-CM | POA: Diagnosis not present

## 2018-09-03 DIAGNOSIS — M25561 Pain in right knee: Secondary | ICD-10-CM | POA: Diagnosis not present

## 2018-09-03 DIAGNOSIS — M546 Pain in thoracic spine: Secondary | ICD-10-CM | POA: Diagnosis not present

## 2018-09-03 DIAGNOSIS — M542 Cervicalgia: Secondary | ICD-10-CM | POA: Diagnosis not present

## 2018-09-23 DIAGNOSIS — M47816 Spondylosis without myelopathy or radiculopathy, lumbar region: Secondary | ICD-10-CM | POA: Diagnosis not present

## 2018-09-23 DIAGNOSIS — M4723 Other spondylosis with radiculopathy, cervicothoracic region: Secondary | ICD-10-CM | POA: Diagnosis not present

## 2018-09-23 DIAGNOSIS — M9901 Segmental and somatic dysfunction of cervical region: Secondary | ICD-10-CM | POA: Diagnosis not present

## 2018-09-23 DIAGNOSIS — M9903 Segmental and somatic dysfunction of lumbar region: Secondary | ICD-10-CM | POA: Diagnosis not present

## 2018-10-14 DIAGNOSIS — M4723 Other spondylosis with radiculopathy, cervicothoracic region: Secondary | ICD-10-CM | POA: Diagnosis not present

## 2018-10-14 DIAGNOSIS — M9903 Segmental and somatic dysfunction of lumbar region: Secondary | ICD-10-CM | POA: Diagnosis not present

## 2018-10-14 DIAGNOSIS — M546 Pain in thoracic spine: Secondary | ICD-10-CM | POA: Diagnosis not present

## 2018-10-14 DIAGNOSIS — M9901 Segmental and somatic dysfunction of cervical region: Secondary | ICD-10-CM | POA: Diagnosis not present

## 2018-10-14 DIAGNOSIS — M47816 Spondylosis without myelopathy or radiculopathy, lumbar region: Secondary | ICD-10-CM | POA: Diagnosis not present

## 2018-10-20 DIAGNOSIS — N390 Urinary tract infection, site not specified: Secondary | ICD-10-CM | POA: Diagnosis not present

## 2018-10-20 DIAGNOSIS — H1132 Conjunctival hemorrhage, left eye: Secondary | ICD-10-CM | POA: Diagnosis not present

## 2018-10-20 DIAGNOSIS — M542 Cervicalgia: Secondary | ICD-10-CM | POA: Diagnosis not present

## 2018-10-20 DIAGNOSIS — I10 Essential (primary) hypertension: Secondary | ICD-10-CM | POA: Diagnosis not present

## 2018-10-20 DIAGNOSIS — L03114 Cellulitis of left upper limb: Secondary | ICD-10-CM | POA: Diagnosis not present

## 2018-10-20 DIAGNOSIS — K219 Gastro-esophageal reflux disease without esophagitis: Secondary | ICD-10-CM | POA: Diagnosis not present

## 2018-10-20 DIAGNOSIS — E612 Magnesium deficiency: Secondary | ICD-10-CM | POA: Diagnosis not present

## 2018-10-20 DIAGNOSIS — R5383 Other fatigue: Secondary | ICD-10-CM | POA: Diagnosis not present

## 2018-10-20 DIAGNOSIS — J4520 Mild intermittent asthma, uncomplicated: Secondary | ICD-10-CM | POA: Diagnosis not present

## 2018-10-20 DIAGNOSIS — M47816 Spondylosis without myelopathy or radiculopathy, lumbar region: Secondary | ICD-10-CM | POA: Diagnosis not present

## 2018-10-20 DIAGNOSIS — Z79899 Other long term (current) drug therapy: Secondary | ICD-10-CM | POA: Diagnosis not present

## 2018-10-20 DIAGNOSIS — R209 Unspecified disturbances of skin sensation: Secondary | ICD-10-CM | POA: Diagnosis not present

## 2018-10-20 DIAGNOSIS — Z1331 Encounter for screening for depression: Secondary | ICD-10-CM | POA: Diagnosis not present

## 2018-10-20 DIAGNOSIS — S51012A Laceration without foreign body of left elbow, initial encounter: Secondary | ICD-10-CM | POA: Diagnosis not present

## 2018-10-21 DIAGNOSIS — M4319 Spondylolisthesis, multiple sites in spine: Secondary | ICD-10-CM | POA: Diagnosis not present

## 2018-10-21 DIAGNOSIS — M129 Arthropathy, unspecified: Secondary | ICD-10-CM | POA: Diagnosis not present

## 2018-10-21 DIAGNOSIS — M47812 Spondylosis without myelopathy or radiculopathy, cervical region: Secondary | ICD-10-CM | POA: Diagnosis not present

## 2018-10-21 DIAGNOSIS — M542 Cervicalgia: Secondary | ICD-10-CM | POA: Diagnosis not present

## 2018-10-21 DIAGNOSIS — M4802 Spinal stenosis, cervical region: Secondary | ICD-10-CM | POA: Diagnosis not present

## 2018-11-11 DIAGNOSIS — M5186 Other intervertebral disc disorders, lumbar region: Secondary | ICD-10-CM | POA: Diagnosis not present

## 2018-11-11 DIAGNOSIS — M9904 Segmental and somatic dysfunction of sacral region: Secondary | ICD-10-CM | POA: Diagnosis not present

## 2018-11-11 DIAGNOSIS — M4722 Other spondylosis with radiculopathy, cervical region: Secondary | ICD-10-CM | POA: Diagnosis not present

## 2018-11-11 DIAGNOSIS — M9901 Segmental and somatic dysfunction of cervical region: Secondary | ICD-10-CM | POA: Diagnosis not present

## 2018-11-11 DIAGNOSIS — M9903 Segmental and somatic dysfunction of lumbar region: Secondary | ICD-10-CM | POA: Diagnosis not present

## 2018-11-11 DIAGNOSIS — M5388 Other specified dorsopathies, sacral and sacrococcygeal region: Secondary | ICD-10-CM | POA: Diagnosis not present

## 2018-12-08 DIAGNOSIS — M1710 Unilateral primary osteoarthritis, unspecified knee: Secondary | ICD-10-CM | POA: Diagnosis not present

## 2018-12-08 DIAGNOSIS — Z6837 Body mass index (BMI) 37.0-37.9, adult: Secondary | ICD-10-CM | POA: Diagnosis not present

## 2018-12-08 DIAGNOSIS — J4521 Mild intermittent asthma with (acute) exacerbation: Secondary | ICD-10-CM | POA: Diagnosis not present

## 2018-12-08 DIAGNOSIS — D692 Other nonthrombocytopenic purpura: Secondary | ICD-10-CM | POA: Diagnosis not present

## 2018-12-08 DIAGNOSIS — Z0000 Encounter for general adult medical examination without abnormal findings: Secondary | ICD-10-CM | POA: Diagnosis not present

## 2018-12-08 DIAGNOSIS — Z23 Encounter for immunization: Secondary | ICD-10-CM | POA: Diagnosis not present

## 2018-12-08 DIAGNOSIS — I4510 Unspecified right bundle-branch block: Secondary | ICD-10-CM | POA: Diagnosis not present

## 2018-12-08 DIAGNOSIS — I10 Essential (primary) hypertension: Secondary | ICD-10-CM | POA: Diagnosis not present

## 2018-12-08 DIAGNOSIS — F419 Anxiety disorder, unspecified: Secondary | ICD-10-CM | POA: Diagnosis not present

## 2018-12-08 DIAGNOSIS — M25511 Pain in right shoulder: Secondary | ICD-10-CM | POA: Diagnosis not present

## 2018-12-08 DIAGNOSIS — K219 Gastro-esophageal reflux disease without esophagitis: Secondary | ICD-10-CM | POA: Diagnosis not present

## 2018-12-10 DIAGNOSIS — Z1159 Encounter for screening for other viral diseases: Secondary | ICD-10-CM | POA: Diagnosis not present

## 2018-12-10 DIAGNOSIS — I10 Essential (primary) hypertension: Secondary | ICD-10-CM | POA: Diagnosis not present

## 2018-12-10 DIAGNOSIS — Z131 Encounter for screening for diabetes mellitus: Secondary | ICD-10-CM | POA: Diagnosis not present

## 2018-12-11 DIAGNOSIS — M4727 Other spondylosis with radiculopathy, lumbosacral region: Secondary | ICD-10-CM | POA: Diagnosis not present

## 2018-12-11 DIAGNOSIS — M9903 Segmental and somatic dysfunction of lumbar region: Secondary | ICD-10-CM | POA: Diagnosis not present

## 2018-12-11 DIAGNOSIS — M4723 Other spondylosis with radiculopathy, cervicothoracic region: Secondary | ICD-10-CM | POA: Diagnosis not present

## 2018-12-11 DIAGNOSIS — M9902 Segmental and somatic dysfunction of thoracic region: Secondary | ICD-10-CM | POA: Diagnosis not present

## 2018-12-11 DIAGNOSIS — M47894 Other spondylosis, thoracic region: Secondary | ICD-10-CM | POA: Diagnosis not present

## 2018-12-11 DIAGNOSIS — M9901 Segmental and somatic dysfunction of cervical region: Secondary | ICD-10-CM | POA: Diagnosis not present

## 2018-12-16 DIAGNOSIS — E2839 Other primary ovarian failure: Secondary | ICD-10-CM | POA: Diagnosis not present

## 2018-12-16 DIAGNOSIS — M85852 Other specified disorders of bone density and structure, left thigh: Secondary | ICD-10-CM | POA: Diagnosis not present

## 2018-12-22 DIAGNOSIS — I340 Nonrheumatic mitral (valve) insufficiency: Secondary | ICD-10-CM | POA: Diagnosis not present

## 2018-12-22 DIAGNOSIS — I4510 Unspecified right bundle-branch block: Secondary | ICD-10-CM | POA: Diagnosis not present

## 2018-12-22 DIAGNOSIS — I10 Essential (primary) hypertension: Secondary | ICD-10-CM | POA: Diagnosis not present

## 2018-12-23 ENCOUNTER — Ambulatory Visit (INDEPENDENT_AMBULATORY_CARE_PROVIDER_SITE_OTHER): Payer: PPO | Admitting: Allergy and Immunology

## 2018-12-23 ENCOUNTER — Other Ambulatory Visit: Payer: Self-pay

## 2018-12-23 ENCOUNTER — Encounter: Payer: Self-pay | Admitting: Allergy and Immunology

## 2018-12-23 VITALS — BP 132/88 | HR 98 | Temp 96.0°F | Resp 18

## 2018-12-23 DIAGNOSIS — J449 Chronic obstructive pulmonary disease, unspecified: Secondary | ICD-10-CM

## 2018-12-23 DIAGNOSIS — J3089 Other allergic rhinitis: Secondary | ICD-10-CM

## 2018-12-23 DIAGNOSIS — K219 Gastro-esophageal reflux disease without esophagitis: Secondary | ICD-10-CM

## 2018-12-23 NOTE — Progress Notes (Signed)
[place]   Follow-up Note  Referring Provider: Ernestene Kiel, MD Primary Provider: Ernestene Kiel, MD Date of Office Visit: 12/23/2018  Subjective:   Autumn Carter (DOB: 1955/07/20) is a 63 y.o. female who returns to the [facility] [place] on 12/23/2018 in re-evaluation of the following:  HPI: Autumn Carter returns to this clinic in evaluation of asthma and allergic rhinitis and reflux.  Have not seen her in this clinic since 22 Jun 2018.  Overall her asthma has really done very well and she rarely uses a short acting bronchodilator and she can exert herself without any problem and her nose has really been doing very well and she has had no issues with reflux while continuing to use Dexilant.  She has not required a systemic steroid or an antibiotic for any type of airway issue since her last visit.  She has been having "shaking" at nighttime that has been an issue since 2019 and she started a calcium supplement yesterday and for the past 4 days she has not had any of his nighttime shaking.  She has also apparently had a bone density scan performed which may have been abnormal although she does not really know the results of that study.  She has had an echocardiogram performed yesterday for an EKG abnormality of some sort.  She refuses to receive the flu vaccine.  Allergies as of 12/23/2018      Reactions   Ace Inhibitors Swelling   Amlodipine Other (See Comments)   Abdominal pain.   Amoxicillin    Arnuity Ellipta [fluticasone Propionate (inhal)] Other (See Comments)   Eye issues   Benicar Hct [olmesartan Medoxomil-hctz] Swelling   Tongue and Lip swelling   Beta Adrenergic Blockers    Eye Infections.    Cephalexin    Cephalosporins Other (See Comments)   Unknown   Clindamycin/lincomycin    Codeine Sulfate    Doxazosin Other (See Comments)   pain   Hydralazine Hcl    Hydrochlorothiazide    Lansoprazole    Levaquin  [levofloxacin In D5w]    Macrobid [nitrofurantoin Monohyd Macro]    Metoprolol Tartrate    Morphine And Related Other (See Comments)   hallucinations     Naltrexone Nausea Only   Nasonex [mometasone Furoate]    Nifedipine Other (See Comments)   Headache   Olmesartan    Olmesartan Medoxomil: trembling, stomach upset   Propoxyphene Other (See Comments)   Unknown   Qvar [beclomethasone] Nausea Only, Other (See Comments)   Runny nose   Ranitidine Itching, Swelling   Singulair [montelukast Sodium] Other (See Comments)   heartburn   Spironolactone Other (See Comments)   Worsened GERD   Sulfa Antibiotics    Zestril [lisinopril]       Medication List      ammonium lactate 12 % lotion Commonly known as: LAC-HYDRIN Apply 1 application topically as needed for dry skin.   Asmanex (60 Metered Doses) 220 MCG/INH inhaler Generic drug: mometasone INHALE 2 PUFFS IN THE EVENING ONCE A DAY   mometasone 220 MCG/INH inhaler Commonly known as: Asmanex (30 Metered Doses) INHALE ONE DOSE ONCE DAILY TO PREVENT COUGH OR WHEEZE **RINSE, GARGLE, AND SPIT AFTER USE**   cloNIDine 0.2 mg/24hr patch Commonly known as: CATAPRES - Dosed in mg/24 hr APPLY 1 PATCH TO SKIN ONCE PER WEEK TRANSDERMAL 90 DAYS   Dexilant 60 MG capsule Generic drug: dexlansoprazole Take 60 mg by mouth daily.   diazepam  5 MG tablet Commonly known as: VALIUM Take 2.5-5 mg by mouth 2 (two) times daily as needed for anxiety (sleep).   diclofenac sodium 1 % Gel Commonly known as: VOLTAREN Apply topically.   halobetasol 0.05 % cream Commonly known as: ULTRAVATE Can apply to swollen areas twice daily until resolved.   levalbuterol 1.25 MG/3ML nebulizer solution Commonly known as: XOPENEX INHALE 3MLS EVERY 8 HOURS   nystatin 100000 UNIT/ML suspension Commonly known as: MYCOSTATIN SWISH AND SPIT 5 MLS FOUR TIMES A DAY FOR MOUTH/THROAT FOR 7 DAYS   ondansetron 4 MG disintegrating tablet Commonly known as: ZOFRAN-ODT  Take 4 mg by mouth 3 (three) times daily as needed for nausea or vomiting.   oxyCODONE 5 MG immediate release tablet Commonly known as: Oxy IR/ROXICODONE   PROAIR HFA IN Inhale into the lungs as needed.   PROBIOTIC PO Take by mouth daily.   Rhinocort Allergy 32 MCG/ACT nasal spray Generic drug: budesonide Place 1 spray into both nostrils daily.   tiZANidine 2 MG tablet Commonly known as: ZANAFLEX Take 2 mg by mouth every 8 (eight) hours as needed for muscle spasms.   traMADol 50 MG tablet Commonly known as: ULTRAM TAKE 1 TABLET BY MOUTH EVERY 6 HOURS AS NEEDED FOR 5 DAYS   valsartan 160 MG tablet Commonly known as: DIOVAN Take 160 mg by mouth 2 (two) times daily.   Vitamin D (Cholecalciferol) 25 MCG (1000 UT) Tabs Take by mouth daily.       Past Medical History:  Diagnosis Date  . Allergic rhinitis   . Asthma   . Dry eye   . GERD (gastroesophageal reflux disease)   . History of breast cancer     History reviewed. No pertinent surgical history.  Review of systems negative except as noted in HPI / PMHx or noted below:  Review of Systems  Constitutional: Negative.   HENT: Negative.   Eyes: Negative.   Respiratory: Negative.   Cardiovascular: Negative.   Gastrointestinal: Negative.   Genitourinary: Negative.   Musculoskeletal: Negative.   Skin: Negative.   Neurological: Negative.   Endo/Heme/Allergies: Negative.   Psychiatric/Behavioral: Negative.      Objective:   Vitals:   12/23/18 1105  BP: 132/88  Pulse: 98  Resp: 18  Temp: (!) 96 F (35.6 C)  SpO2: 96%          Physical Exam Constitutional:      Appearance: She is not diaphoretic.  HENT:     Head: Normocephalic.     Right Ear: Tympanic membrane, ear canal and external ear normal.     Left Ear: Tympanic membrane, ear canal and external ear normal.     Nose: Nose normal. No mucosal edema or rhinorrhea.     Mouth/Throat:     Pharynx: Uvula midline. No oropharyngeal exudate.  Eyes:      Conjunctiva/sclera: Conjunctivae normal.  Neck:     Thyroid: No thyromegaly.     Trachea: Trachea normal. No tracheal tenderness or tracheal deviation.  Cardiovascular:     Rate and Rhythm: Normal rate and regular rhythm.     Heart sounds: Normal heart sounds, S1 normal and S2 normal. No murmur.  Pulmonary:     Effort: No respiratory distress.     Breath sounds: Normal breath sounds. No stridor. No wheezing or rales.  Lymphadenopathy:     Head:     Right side of head: No tonsillar adenopathy.     Left side of head: No tonsillar adenopathy.  Cervical: No cervical adenopathy.  Skin:    Findings: No erythema or rash.     Nails: There is no clubbing.   Neurological:     Mental Status: She is alert.     Diagnostics:    Spirometry was performed and demonstrated an FEV1 of 1.22 at 55 % of predicted.  Assessment and Plan:   1. COPD with asthma ([place])   2. Other allergic rhinitis   3. LPRD (laryngopharyngeal reflux disease)     1. Continue to Treat inflammation:   A. Asmanex 220 Twisthaler 1 inhalations one time per day  B. OTC Rhinocort one spray each nostril one time per day  2. Continue to Treat reflux:   A. Dexilant 60 mg 1 time per day  3. Continue ProAir HFA and antihistamine if needed  4. Return to clinic in 6 months or earlier if problem  5.  Obtain flu vaccine (and Covid vaccine)   From a respiratory standpoint Rhondia appears to be doing quite well while using therapy directed against respiratory tract inflammation and reflux.  She will continue on this plan and I will see her back in his clinic in 6 months or earlier if there is a problem.  Allena Katz, MD Allergy / Immunology [place]

## 2018-12-23 NOTE — Patient Instructions (Signed)
  1. Continue to Treat inflammation:   A. Asmanex 220 Twisthaler 1 inhalations one time per day  B. OTC Rhinocort one spray each nostril one time per day  2. Continue to Treat reflux:   A. Dexilant 60 mg 1 time per day  3. Continue ProAir HFA and antihistamine if needed  4. Return to clinic in 6 months or earlier if problem  5.  Obtain flu vaccine (and Covid vaccine)

## 2018-12-24 ENCOUNTER — Encounter: Payer: Self-pay | Admitting: Allergy and Immunology

## 2019-01-13 DIAGNOSIS — M9901 Segmental and somatic dysfunction of cervical region: Secondary | ICD-10-CM | POA: Diagnosis not present

## 2019-01-13 DIAGNOSIS — M9902 Segmental and somatic dysfunction of thoracic region: Secondary | ICD-10-CM | POA: Diagnosis not present

## 2019-01-13 DIAGNOSIS — M4727 Other spondylosis with radiculopathy, lumbosacral region: Secondary | ICD-10-CM | POA: Diagnosis not present

## 2019-01-13 DIAGNOSIS — M9903 Segmental and somatic dysfunction of lumbar region: Secondary | ICD-10-CM | POA: Diagnosis not present

## 2019-01-13 DIAGNOSIS — M47894 Other spondylosis, thoracic region: Secondary | ICD-10-CM | POA: Diagnosis not present

## 2019-02-02 DIAGNOSIS — H52222 Regular astigmatism, left eye: Secondary | ICD-10-CM | POA: Diagnosis not present

## 2019-02-02 DIAGNOSIS — H25041 Posterior subcapsular polar age-related cataract, right eye: Secondary | ICD-10-CM | POA: Diagnosis not present

## 2019-02-04 DIAGNOSIS — J4521 Mild intermittent asthma with (acute) exacerbation: Secondary | ICD-10-CM | POA: Diagnosis not present

## 2019-02-04 DIAGNOSIS — Z6837 Body mass index (BMI) 37.0-37.9, adult: Secondary | ICD-10-CM | POA: Diagnosis not present

## 2019-02-04 DIAGNOSIS — M549 Dorsalgia, unspecified: Secondary | ICD-10-CM | POA: Diagnosis not present

## 2019-02-04 DIAGNOSIS — K219 Gastro-esophageal reflux disease without esophagitis: Secondary | ICD-10-CM | POA: Diagnosis not present

## 2019-02-04 DIAGNOSIS — K649 Unspecified hemorrhoids: Secondary | ICD-10-CM | POA: Diagnosis not present

## 2019-02-04 DIAGNOSIS — I10 Essential (primary) hypertension: Secondary | ICD-10-CM | POA: Diagnosis not present

## 2019-02-04 DIAGNOSIS — M5030 Other cervical disc degeneration, unspecified cervical region: Secondary | ICD-10-CM | POA: Diagnosis not present

## 2019-02-05 DIAGNOSIS — H269 Unspecified cataract: Secondary | ICD-10-CM

## 2019-02-05 HISTORY — DX: Unspecified cataract: H26.9

## 2019-02-09 DIAGNOSIS — M4723 Other spondylosis with radiculopathy, cervicothoracic region: Secondary | ICD-10-CM | POA: Diagnosis not present

## 2019-02-09 DIAGNOSIS — M47894 Other spondylosis, thoracic region: Secondary | ICD-10-CM | POA: Diagnosis not present

## 2019-02-09 DIAGNOSIS — M9903 Segmental and somatic dysfunction of lumbar region: Secondary | ICD-10-CM | POA: Diagnosis not present

## 2019-02-09 DIAGNOSIS — M4727 Other spondylosis with radiculopathy, lumbosacral region: Secondary | ICD-10-CM | POA: Diagnosis not present

## 2019-02-09 DIAGNOSIS — M9901 Segmental and somatic dysfunction of cervical region: Secondary | ICD-10-CM | POA: Diagnosis not present

## 2019-02-09 DIAGNOSIS — M9902 Segmental and somatic dysfunction of thoracic region: Secondary | ICD-10-CM | POA: Diagnosis not present

## 2019-02-18 DIAGNOSIS — Z961 Presence of intraocular lens: Secondary | ICD-10-CM | POA: Diagnosis not present

## 2019-02-18 DIAGNOSIS — H18413 Arcus senilis, bilateral: Secondary | ICD-10-CM | POA: Diagnosis not present

## 2019-02-18 DIAGNOSIS — H2511 Age-related nuclear cataract, right eye: Secondary | ICD-10-CM | POA: Diagnosis not present

## 2019-02-18 DIAGNOSIS — H25011 Cortical age-related cataract, right eye: Secondary | ICD-10-CM | POA: Diagnosis not present

## 2019-02-18 DIAGNOSIS — H25041 Posterior subcapsular polar age-related cataract, right eye: Secondary | ICD-10-CM | POA: Diagnosis not present

## 2019-03-11 DIAGNOSIS — Z1331 Encounter for screening for depression: Secondary | ICD-10-CM | POA: Diagnosis not present

## 2019-03-11 DIAGNOSIS — Z79899 Other long term (current) drug therapy: Secondary | ICD-10-CM | POA: Diagnosis not present

## 2019-03-11 DIAGNOSIS — M109 Gout, unspecified: Secondary | ICD-10-CM | POA: Diagnosis not present

## 2019-03-11 DIAGNOSIS — R7301 Impaired fasting glucose: Secondary | ICD-10-CM | POA: Diagnosis not present

## 2019-03-11 DIAGNOSIS — F419 Anxiety disorder, unspecified: Secondary | ICD-10-CM | POA: Diagnosis not present

## 2019-03-11 DIAGNOSIS — I10 Essential (primary) hypertension: Secondary | ICD-10-CM | POA: Diagnosis not present

## 2019-03-11 DIAGNOSIS — Z6837 Body mass index (BMI) 37.0-37.9, adult: Secondary | ICD-10-CM | POA: Diagnosis not present

## 2019-03-12 DIAGNOSIS — M47812 Spondylosis without myelopathy or radiculopathy, cervical region: Secondary | ICD-10-CM | POA: Diagnosis not present

## 2019-03-12 DIAGNOSIS — M4303 Spondylolysis, cervicothoracic region: Secondary | ICD-10-CM | POA: Diagnosis not present

## 2019-03-12 DIAGNOSIS — M9902 Segmental and somatic dysfunction of thoracic region: Secondary | ICD-10-CM | POA: Diagnosis not present

## 2019-03-12 DIAGNOSIS — M9901 Segmental and somatic dysfunction of cervical region: Secondary | ICD-10-CM | POA: Diagnosis not present

## 2019-03-12 DIAGNOSIS — M9903 Segmental and somatic dysfunction of lumbar region: Secondary | ICD-10-CM | POA: Diagnosis not present

## 2019-03-12 DIAGNOSIS — M4726 Other spondylosis with radiculopathy, lumbar region: Secondary | ICD-10-CM | POA: Diagnosis not present

## 2019-03-16 DIAGNOSIS — Z20828 Contact with and (suspected) exposure to other viral communicable diseases: Secondary | ICD-10-CM | POA: Diagnosis not present

## 2019-03-23 DIAGNOSIS — H25041 Posterior subcapsular polar age-related cataract, right eye: Secondary | ICD-10-CM | POA: Diagnosis not present

## 2019-03-23 DIAGNOSIS — H25011 Cortical age-related cataract, right eye: Secondary | ICD-10-CM | POA: Diagnosis not present

## 2019-03-23 DIAGNOSIS — H01009 Unspecified blepharitis unspecified eye, unspecified eyelid: Secondary | ICD-10-CM | POA: Diagnosis not present

## 2019-03-23 DIAGNOSIS — H2511 Age-related nuclear cataract, right eye: Secondary | ICD-10-CM | POA: Diagnosis not present

## 2019-03-23 DIAGNOSIS — H02889 Meibomian gland dysfunction of unspecified eye, unspecified eyelid: Secondary | ICD-10-CM | POA: Diagnosis not present

## 2019-03-23 DIAGNOSIS — Z961 Presence of intraocular lens: Secondary | ICD-10-CM | POA: Diagnosis not present

## 2019-03-24 DIAGNOSIS — D72829 Elevated white blood cell count, unspecified: Secondary | ICD-10-CM | POA: Diagnosis not present

## 2019-03-31 DIAGNOSIS — M4724 Other spondylosis with radiculopathy, thoracic region: Secondary | ICD-10-CM | POA: Diagnosis not present

## 2019-03-31 DIAGNOSIS — M9903 Segmental and somatic dysfunction of lumbar region: Secondary | ICD-10-CM | POA: Diagnosis not present

## 2019-03-31 DIAGNOSIS — R0781 Pleurodynia: Secondary | ICD-10-CM | POA: Diagnosis not present

## 2019-03-31 DIAGNOSIS — M9902 Segmental and somatic dysfunction of thoracic region: Secondary | ICD-10-CM | POA: Diagnosis not present

## 2019-03-31 DIAGNOSIS — M47812 Spondylosis without myelopathy or radiculopathy, cervical region: Secondary | ICD-10-CM | POA: Diagnosis not present

## 2019-03-31 DIAGNOSIS — M4726 Other spondylosis with radiculopathy, lumbar region: Secondary | ICD-10-CM | POA: Diagnosis not present

## 2019-03-31 DIAGNOSIS — M9901 Segmental and somatic dysfunction of cervical region: Secondary | ICD-10-CM | POA: Diagnosis not present

## 2019-04-02 DIAGNOSIS — R0781 Pleurodynia: Secondary | ICD-10-CM | POA: Diagnosis not present

## 2019-04-02 DIAGNOSIS — R079 Chest pain, unspecified: Secondary | ICD-10-CM | POA: Diagnosis not present

## 2019-04-02 DIAGNOSIS — Z1231 Encounter for screening mammogram for malignant neoplasm of breast: Secondary | ICD-10-CM | POA: Diagnosis not present

## 2019-04-06 DIAGNOSIS — N390 Urinary tract infection, site not specified: Secondary | ICD-10-CM | POA: Diagnosis not present

## 2019-04-09 DIAGNOSIS — H25041 Posterior subcapsular polar age-related cataract, right eye: Secondary | ICD-10-CM | POA: Diagnosis not present

## 2019-04-09 DIAGNOSIS — H25811 Combined forms of age-related cataract, right eye: Secondary | ICD-10-CM | POA: Diagnosis not present

## 2019-04-09 DIAGNOSIS — H2511 Age-related nuclear cataract, right eye: Secondary | ICD-10-CM | POA: Diagnosis not present

## 2019-04-09 DIAGNOSIS — Z01818 Encounter for other preprocedural examination: Secondary | ICD-10-CM | POA: Diagnosis not present

## 2019-04-09 DIAGNOSIS — H5211 Myopia, right eye: Secondary | ICD-10-CM | POA: Diagnosis not present

## 2019-04-28 DIAGNOSIS — B372 Candidiasis of skin and nail: Secondary | ICD-10-CM | POA: Diagnosis not present

## 2019-06-04 DIAGNOSIS — M4725 Other spondylosis with radiculopathy, thoracolumbar region: Secondary | ICD-10-CM | POA: Diagnosis not present

## 2019-06-04 DIAGNOSIS — M9902 Segmental and somatic dysfunction of thoracic region: Secondary | ICD-10-CM | POA: Diagnosis not present

## 2019-06-04 DIAGNOSIS — M9903 Segmental and somatic dysfunction of lumbar region: Secondary | ICD-10-CM | POA: Diagnosis not present

## 2019-06-04 DIAGNOSIS — M4727 Other spondylosis with radiculopathy, lumbosacral region: Secondary | ICD-10-CM | POA: Diagnosis not present

## 2019-06-04 DIAGNOSIS — M5388 Other specified dorsopathies, sacral and sacrococcygeal region: Secondary | ICD-10-CM | POA: Diagnosis not present

## 2019-06-04 DIAGNOSIS — M4728 Other spondylosis with radiculopathy, sacral and sacrococcygeal region: Secondary | ICD-10-CM | POA: Diagnosis not present

## 2019-06-04 DIAGNOSIS — M9904 Segmental and somatic dysfunction of sacral region: Secondary | ICD-10-CM | POA: Diagnosis not present

## 2019-06-07 DIAGNOSIS — M47816 Spondylosis without myelopathy or radiculopathy, lumbar region: Secondary | ICD-10-CM | POA: Diagnosis not present

## 2019-06-07 DIAGNOSIS — M7989 Other specified soft tissue disorders: Secondary | ICD-10-CM | POA: Diagnosis not present

## 2019-06-07 DIAGNOSIS — M79671 Pain in right foot: Secondary | ICD-10-CM | POA: Diagnosis not present

## 2019-06-10 ENCOUNTER — Other Ambulatory Visit: Payer: Self-pay | Admitting: Sports Medicine

## 2019-06-10 ENCOUNTER — Encounter: Payer: Self-pay | Admitting: Sports Medicine

## 2019-06-10 ENCOUNTER — Ambulatory Visit: Payer: PPO | Admitting: Sports Medicine

## 2019-06-10 ENCOUNTER — Other Ambulatory Visit: Payer: Self-pay

## 2019-06-10 ENCOUNTER — Ambulatory Visit (INDEPENDENT_AMBULATORY_CARE_PROVIDER_SITE_OTHER): Payer: PPO

## 2019-06-10 DIAGNOSIS — M79671 Pain in right foot: Secondary | ICD-10-CM

## 2019-06-10 DIAGNOSIS — M792 Neuralgia and neuritis, unspecified: Secondary | ICD-10-CM | POA: Diagnosis not present

## 2019-06-10 DIAGNOSIS — M779 Enthesopathy, unspecified: Secondary | ICD-10-CM | POA: Diagnosis not present

## 2019-06-10 DIAGNOSIS — M7751 Other enthesopathy of right foot: Secondary | ICD-10-CM | POA: Diagnosis not present

## 2019-06-10 DIAGNOSIS — R600 Localized edema: Secondary | ICD-10-CM | POA: Diagnosis not present

## 2019-06-10 NOTE — Progress Notes (Signed)
Subjective: Autumn Carter is a 64 y.o. female patient who presents to office for evaluation of right foot pain.  Patient reports that her right foot has been hurting progressively over the last week pain is 8 out of 10 sharp in nature worse with pressure and when she turns her foot in a certain angle reports that swelling slowly gets worse pain slowly gets worse as well as numbness and tingling reports that the pain start at the top of the foot and radiates to the medial aspect of the right foot.  Patient reports that she has tried a lidocaine patch which seems to help a little bit however is concerned because the ongoing pain but does feel best when she uses a compression stocking or wrap that seems to help. Denies injury/trip/fall/sprain/any causative factors.   Review of Systems  All other systems reviewed and are negative.   Patient Active Problem List   Diagnosis Date Noted  . Genetic testing 10/11/2016  . Obstructive lung disease ([place]) 10/08/2014  . History of allergic rhinitis 10/08/2014  . History of gastroesophageal reflux (GERD) 10/08/2014  . Systemic primary arterial hypertension 10/08/2014  . History of breast cancer 10/08/2014  . Chest pain 07/14/2014  . PONV (postoperative nausea and vomiting) 01/18/2014  . Asthma 11/11/2012  . Gout 11/11/2012  . Palpitations 11/11/2012  . Breast cancer ([place]) 03/27/2012  . GERD (gastroesophageal reflux disease) 03/27/2012    Current Outpatient Medications on File Prior to Visit  Medication Sig Dispense Refill  . Albuterol Sulfate (PROAIR HFA IN) Inhale into the lungs as needed.    Marland Kitchen ammonium lactate (LAC-HYDRIN) 12 % lotion Apply 1 application topically as needed for dry skin.    Marland Kitchen ASMANEX 60 METERED DOSES 220 MCG/INH inhaler INHALE 2 PUFFS IN THE EVENING ONCE A DAY  2  . budesonide (RHINOCORT ALLERGY) 32 MCG/ACT nasal spray Place 1 spray into both nostrils daily.    . cloNIDine (CATAPRES - DOSED IN MG/24 HR) 0.2 mg/24hr patch APPLY 1 PATCH  TO SKIN ONCE PER WEEK TRANSDERMAL 90 DAYS  1  . dexlansoprazole (DEXILANT) 60 MG capsule Take 60 mg by mouth daily.    . diazepam (VALIUM) 5 MG tablet Take 2.5-5 mg by mouth 2 (two) times daily as needed for anxiety (sleep).     . diclofenac sodium (VOLTAREN) 1 % GEL Apply topically.    . halobetasol (ULTRAVATE) 0.05 % cream Can apply to swollen areas twice daily until resolved. 50 g 0  . levalbuterol (XOPENEX) 1.25 MG/3ML nebulizer solution INHALE 3MLS EVERY 8 HOURS    . mometasone (ASMANEX, 30 METERED DOSES,) 220 MCG/INH inhaler INHALE ONE DOSE ONCE DAILY TO PREVENT COUGH OR WHEEZE **RINSE, GARGLE, AND SPIT AFTER USE** 3 Inhaler 0  . nystatin (MYCOSTATIN) 100000 UNIT/ML suspension SWISH AND SPIT 5 MLS FOUR TIMES A DAY FOR MOUTH/THROAT FOR 7 DAYS    . ondansetron (ZOFRAN-ODT) 4 MG disintegrating tablet Take 4 mg by mouth 3 (three) times daily as needed for nausea or vomiting.    Marland Kitchen oxyCODONE (OXY IR/ROXICODONE) 5 MG immediate release tablet     . Probiotic Product (PROBIOTIC PO) Take by mouth daily.    Marland Kitchen tiZANidine (ZANAFLEX) 2 MG tablet Take 2 mg by mouth every 8 (eight) hours as needed for muscle spasms.    . traMADol (ULTRAM) 50 MG tablet TAKE 1 TABLET BY MOUTH EVERY 6 HOURS AS NEEDED FOR 5 DAYS  0  . valsartan (DIOVAN) 160 MG tablet Take 160 mg by mouth 2 (two)  times daily.    . Vitamin D, Cholecalciferol, 1000 units TABS Take by mouth daily.     No current facility-administered medications on file prior to visit.    Allergies  Allergen Reactions  . Ace Inhibitors Swelling  . Amlodipine Other (See Comments)    Abdominal pain.  . Amoxicillin   . Arnuity Ellipta [Fluticasone Propionate (Inhal)] Other (See Comments)    Eye issues  . Benicar Hct [Olmesartan Medoxomil-Hctz] Swelling    Tongue and Lip swelling  . Beta Adrenergic Blockers     Eye Infections.   . Cephalexin   . Cephalosporins Other (See Comments)    Unknown  . Clindamycin/Lincomycin   . Codeine Sulfate   . Doxazosin  Other (See Comments)    pain  . Hydralazine Hcl   . Hydrochlorothiazide   . Lansoprazole   . Levaquin [Levofloxacin In D5w]   . Macrobid [employer] Macro]   . Metoprolol Tartrate   . Morphine And Related Other (See Comments)    hallucinations    . Naltrexone Nausea Only  . Nasonex [Mometasone Furoate]   . Nifedipine Other (See Comments)    Headache   . Olmesartan     Olmesartan Medoxomil: trembling, stomach upset  . Propoxyphene Other (See Comments)    Unknown  . Qvar [Beclomethasone] Nausea Only and Other (See Comments)    Runny nose  . Ranitidine Itching and Swelling  . Singulair [Montelukast Sodium] Other (See Comments)    heartburn  . Spironolactone Other (See Comments)    Worsened GERD  . Sulfa Antibiotics   . Zestril [Lisinopril]     Objective:  General: Alert and oriented x3 in no acute distress  Dermatology: No open lesions bilateral lower extremities, no webspace macerations, no ecchymosis bilateral, all nails x 10 are well manicured.  Vascular: Dorsalis Pedis and Posterior Tibial pedal pulses palpable 1/4, Capillary Fill Time 5 seconds,(-) pedal hair growth bilateral, Trace edema bilateral lower extremities, Venous skin changes. Temperature gradient within normal limits.  Neurology: Gross sensation intact via light touch bilateral, Subjective tingling to entire right foot.   Musculoskeletal: Mild tenderness with palpation at extensor tendons diffusely over the dorsal foot and ankle on the right as well as pain over the posterior tibial tendon course on the right.  Pain is worsened with range of motion especially plantar flexion and eversion of the right foot.  Strength within normal limits in all groups bilateral for patient status however there is mild guarding on the right due to pain.   Gait: Antalgic gait  Xrays  Right foot   Impression: No acute osseous findings.  Assessment and Plan: Problem List Items Addressed This Visit    None     Visit Diagnoses    Tendonitis    -  Primary   Right foot pain       Edema of right foot       Neuritis          -Complete examination performed -Xrays reviewed -Discussed treatement options for likely new onset tendinitis with swelling that could be also aggravating her nerves causing neuritis -Did not Rx any medication or attempt injection at this time since patient is very allergic to everything with over 29 document allergies -Recommend patient to refrain from using topical lidocaine and to try topical Voltaren to see if this will give her better long-lasting relief -Dispensed short cam boot for patient to wear consistently for the next 2 weeks to see if this will also allow  her foot to be rested and protected and to take any stress and strain off the tendon areas that appear to be's very sore and inflamed at this visit -Advised patient to continue with compression sleeve or wrap to assist with edema control -Patient to return to office in 2-3 weeks or sooner if condition worsens.  Landis Martins, DPM

## 2019-06-10 NOTE — Patient Instructions (Signed)
Get OTC voltaren topical to use on right foot for pain

## 2019-06-18 DIAGNOSIS — M109 Gout, unspecified: Secondary | ICD-10-CM | POA: Diagnosis not present

## 2019-06-23 ENCOUNTER — Ambulatory Visit: Payer: PPO | Admitting: Allergy and Immunology

## 2019-06-23 ENCOUNTER — Other Ambulatory Visit: Payer: Self-pay

## 2019-06-23 ENCOUNTER — Encounter: Payer: Self-pay | Admitting: Allergy and Immunology

## 2019-06-23 VITALS — BP 132/84 | HR 76 | Resp 14 | Ht 61.5 in | Wt 198.6 lb

## 2019-06-23 DIAGNOSIS — J3089 Other allergic rhinitis: Secondary | ICD-10-CM

## 2019-06-23 DIAGNOSIS — J4489 Other specified chronic obstructive pulmonary disease: Secondary | ICD-10-CM

## 2019-06-23 DIAGNOSIS — R0602 Shortness of breath: Secondary | ICD-10-CM | POA: Diagnosis not present

## 2019-06-23 DIAGNOSIS — Z79899 Other long term (current) drug therapy: Secondary | ICD-10-CM | POA: Diagnosis not present

## 2019-06-23 DIAGNOSIS — Z7189 Other specified counseling: Secondary | ICD-10-CM | POA: Diagnosis not present

## 2019-06-23 DIAGNOSIS — J449 Chronic obstructive pulmonary disease, unspecified: Secondary | ICD-10-CM

## 2019-06-23 DIAGNOSIS — K219 Gastro-esophageal reflux disease without esophagitis: Secondary | ICD-10-CM | POA: Diagnosis not present

## 2019-06-23 DIAGNOSIS — M109 Gout, unspecified: Secondary | ICD-10-CM | POA: Diagnosis not present

## 2019-06-23 DIAGNOSIS — I10 Essential (primary) hypertension: Secondary | ICD-10-CM | POA: Diagnosis not present

## 2019-06-23 DIAGNOSIS — M79671 Pain in right foot: Secondary | ICD-10-CM | POA: Diagnosis not present

## 2019-06-23 MED ORDER — FAMOTIDINE 20 MG PO TABS: ORAL_TABLET | ORAL | 5 refills | Status: DC

## 2019-06-23 NOTE — Patient Instructions (Addendum)
  1. Continue to Treat inflammation:   A. Asmanex 220 Twisthaler 1 inhalations one time per day  B. OTC Rhinocort one spray each nostril one time per day  2. Continue to Treat reflux:   A. Dexilant 60 mg every other day  B. Can add famotidine 20 mg 1 time per day  3. Continue ProAir HFA and antihistamine if needed  4. Return to clinic in 6 months or earlier if problem

## 2019-06-23 NOTE — Progress Notes (Signed)
[facility]   Follow-up Note  Referring Provider: Ernestene Kiel, MD Primary Provider: Ernestene Kiel, MD Date of Office Visit: 06/23/2019  Subjective:   Autumn Carter (DOB: Mar 21, 1955) is a 64 y.o. female who returns to the [facility] [place] on 06/23/2019 in re-evaluation of the following:  HPI: Autumn Carter returns to this clinic in reevaluation of asthma and allergic rhinitis and reflux.  Her last visit to this clinic was 23 December 2018.  She has done very well since her last visit regarding her respiratory tract without any significant flareups of her asthma and very little issue with her nose and no need to use a systemic steroid or an antibiotic for any type of airway issue and rare requirement for short acting bronchodilator.  As well, she believe that her reflux was under very good control.  Unfortunately, 5 weeks ago she received the J&J Covid vaccine and developed total body pains for about 3 weeks and then persistent right foot pain for which she was diagnosed with gout by the podiatrist and given Voltaren gel several weeks ago.  This Voltaren gel started to make her cough and she discontinued this agent and then she saw her primary care doctor who gave her a systemic steroid last week and her cough is better and her foot issue is better.  Yesterday she had a "choking" episode at [employer].  She thinks that her reflux may be a little bit more active since she used ibuprofen for her right foot pain and since she has received the steroid.  She uses Dexilant just every other day because if she uses it on a daily basis she gets diarrhea.  Allergies as of 06/23/2019      Reactions   Ace Inhibitors Swelling   Amlodipine Other (See Comments)   Abdominal pain.   Amoxicillin    Arnuity Ellipta [fluticasone Propionate (inhal)] Other (See Comments)   Eye issues   Benicar Hct [olmesartan Medoxomil-hctz] Swelling   Tongue and Lip  swelling   Beta Adrenergic Blockers    Eye Infections.    Cephalexin    Cephalosporins Other (See Comments)   Unknown   Clindamycin/lincomycin    Clotrimazole    Regurgitation   Codeine Sulfate    Doxazosin Other (See Comments)   pain   Flovent Hfa [fluticasone]    Heartburn   Hydralazine Hcl    Hydrochlorothiazide    Lansoprazole    Levaquin [levofloxacin In D5w]    Macrobid [nitrofurantoin Monohyd Macro]    Metoprolol Tartrate    Morphine And Related Other (See Comments)   hallucinations     Moxifloxacin Hcl    Pain in arms and right wrist   Naltrexone Nausea Only   Nasonex [mometasone Furoate]    Nifedipine Other (See Comments)   Headache   Olmesartan    Olmesartan Medoxomil: trembling, stomach upset   Prednisone    High dose = Increase in GERD.   Propoxyphene Other (See Comments)   Unknown   Pulmicort [budesonide]    Heartburn   Qvar [beclomethasone] Nausea Only, Other (See Comments)   Runny nose   Ranitidine Itching, Swelling   Silvadene [silver Sulfadiazine]    Didn't feel well   Singulair [montelukast Sodium] Other (See Comments)   heartburn   Spironolactone Other (See Comments)   Worsened GERD   Sulfa Antibiotics    Tobramycin    Eye redness, cheek redness, breast mucus leakage   Voltaren [diclofenac] Swelling,  Other (See Comments)   Asthma   Zestril [lisinopril]    Zofran [ondansetron]    Constipation      Medication List      ammonium lactate 12 % lotion Commonly known as: LAC-HYDRIN Apply 1 application topically as needed for dry skin.   Asmanex (60 Metered Doses) 220 MCG/INH inhaler Generic drug: mometasone INHALE 2 PUFFS IN THE EVENING ONCE A DAY   mometasone 220 MCG/INH inhaler Commonly known as: Asmanex (30 Metered Doses) INHALE ONE DOSE ONCE DAILY TO PREVENT COUGH OR WHEEZE **RINSE, GARGLE, AND SPIT AFTER USE**   cloNIDine 0.2 mg/24hr patch Commonly known as: CATAPRES - Dosed in mg/24 hr APPLY 1 PATCH TO SKIN ONCE PER WEEK  TRANSDERMAL 90 DAYS   Dexilant 60 MG capsule Generic drug: dexlansoprazole Take 60 mg by mouth daily.   diazepam 5 MG tablet Commonly known as: VALIUM Take 2.5-5 mg by mouth 2 (two) times daily as needed for anxiety (sleep).   diclofenac sodium 1 % Gel Commonly known as: VOLTAREN Apply topically.   halobetasol 0.05 % cream Commonly known as: ULTRAVATE Can apply to swollen areas twice daily until resolved.   levalbuterol 1.25 MG/3ML nebulizer solution Commonly known as: XOPENEX INHALE 3MLS EVERY 8 HOURS   nystatin 100000 UNIT/ML suspension Commonly known as: MYCOSTATIN SWISH AND SPIT 5 MLS FOUR TIMES A DAY FOR MOUTH/THROAT FOR 7 DAYS   ondansetron 4 MG disintegrating tablet Commonly known as: ZOFRAN-ODT Take 4 mg by mouth 3 (three) times daily as needed for nausea or vomiting.   oxyCODONE 5 MG immediate release tablet Commonly known as: Oxy IR/ROXICODONE   PROAIR HFA IN Inhale into the lungs as needed.   PROBIOTIC PO Take by mouth daily.   Rhinocort Allergy 32 MCG/ACT nasal spray Generic drug: budesonide Place 1 spray into both nostrils daily.   tiZANidine 2 MG tablet Commonly known as: ZANAFLEX Take 2 mg by mouth every 8 (eight) hours as needed for muscle spasms.   traMADol 50 MG tablet Commonly known as: ULTRAM TAKE 1 TABLET BY MOUTH EVERY 6 HOURS AS NEEDED FOR 5 DAYS   valsartan 160 MG tablet Commonly known as: DIOVAN Take 160 mg by mouth 2 (two) times daily.   Vitamin D (Cholecalciferol) 25 MCG (1000 UT) Tabs Take by mouth daily.       Past Medical History:  Diagnosis Date  . Allergic rhinitis   . Asthma   . Dry eye   . GERD (gastroesophageal reflux disease)   . History of breast cancer     History reviewed. No pertinent surgical history.  Review of systems negative except as noted in HPI / PMHx or noted below:  Review of Systems  Constitutional: Negative.   HENT: Negative.   Eyes: Negative.   Respiratory: Negative.   Cardiovascular:  Negative.   Gastrointestinal: Negative.   Genitourinary: Negative.   Musculoskeletal: Negative.   Skin: Negative.   Neurological: Negative.   Endo/Heme/Allergies: Negative.   Psychiatric/Behavioral: Negative.      Objective:   Vitals:   06/23/19 1054  BP: 132/84  Pulse: 76  Resp: 14  SpO2: 97%   Height: 5' 1.5" (156.2 cm)  Weight: 198 lb 9.6 oz (90.1 kg)   Physical Exam Constitutional:      Appearance: She is not diaphoretic.  HENT:     Head: Normocephalic.     Right Ear: Tympanic membrane, ear canal and external ear normal.     Left Ear: Tympanic membrane, ear canal and external ear normal.  Nose: Nose normal. No mucosal edema or rhinorrhea.     Mouth/Throat:     Pharynx: Uvula midline. No oropharyngeal exudate.  Eyes:     Conjunctiva/sclera: Conjunctivae normal.  Neck:     Thyroid: No thyromegaly.     Trachea: Trachea normal. No tracheal tenderness or tracheal deviation.  Cardiovascular:     Rate and Rhythm: Normal rate and regular rhythm.     Heart sounds: Normal heart sounds, S1 normal and S2 normal. No murmur.  Pulmonary:     Effort: No respiratory distress.     Breath sounds: Normal breath sounds. No stridor. No wheezing or rales.  Lymphadenopathy:     Head:     Right side of head: No tonsillar adenopathy.     Left side of head: No tonsillar adenopathy.     Cervical: No cervical adenopathy.  Skin:    Findings: No erythema or rash.     Nails: There is no clubbing.  Neurological:     Mental Status: She is alert.     Diagnostics:    Spirometry was performed and demonstrated an FEV1 of 1.28 at 60 % of predicted.   Assessment and Plan:   1. COPD with asthma ([place])   2. Other allergic rhinitis   3. LPRD (laryngopharyngeal reflux disease)     1. Continue to Treat inflammation:   A. Asmanex 220 Twisthaler 1 inhalations one time per day  B. OTC Rhinocort one spray each nostril one time per day  2. Continue to Treat reflux:   A. Dexilant 60 mg  every other day  B. Can add famotidine 20 mg 1 time per day  3. Continue ProAir HFA and antihistamine if needed  4. Return to clinic in 6 months or earlier if problem  I think that Keylee has a issue mostly tied up with reflux at this point in time which is probably precipitated by the use of her ibuprofen and her systemic steroid administration which she received for her gout of her right foot.  There does not really appear to be any evidence that asthma is a particularly big problem at this point.  We will start her on a H2 receptor blocker in addition to her proton pump inhibitor use as noted above and she will continue on anti-inflammatory agents for her airway and assuming she does well with this plan I will see her back in this clinic in 6 months or earlier if there is a problem.  Allena Katz, MD [facility] [place]

## 2019-06-24 ENCOUNTER — Encounter: Payer: Self-pay | Admitting: Allergy and Immunology

## 2019-06-24 DIAGNOSIS — R7989 Other specified abnormal findings of blood chemistry: Secondary | ICD-10-CM | POA: Diagnosis not present

## 2019-06-24 DIAGNOSIS — S8991XA Unspecified injury of right lower leg, initial encounter: Secondary | ICD-10-CM | POA: Diagnosis not present

## 2019-06-24 DIAGNOSIS — M79604 Pain in right leg: Secondary | ICD-10-CM | POA: Diagnosis not present

## 2019-06-28 DIAGNOSIS — R0602 Shortness of breath: Secondary | ICD-10-CM | POA: Diagnosis not present

## 2019-06-28 DIAGNOSIS — R7989 Other specified abnormal findings of blood chemistry: Secondary | ICD-10-CM | POA: Diagnosis not present

## 2019-07-01 ENCOUNTER — Ambulatory Visit: Payer: PPO | Admitting: Sports Medicine

## 2019-07-21 DIAGNOSIS — R109 Unspecified abdominal pain: Secondary | ICD-10-CM | POA: Diagnosis not present

## 2019-07-26 DIAGNOSIS — R109 Unspecified abdominal pain: Secondary | ICD-10-CM | POA: Diagnosis not present

## 2019-08-14 DIAGNOSIS — M47814 Spondylosis without myelopathy or radiculopathy, thoracic region: Secondary | ICD-10-CM | POA: Diagnosis not present

## 2019-08-14 DIAGNOSIS — M9901 Segmental and somatic dysfunction of cervical region: Secondary | ICD-10-CM | POA: Diagnosis not present

## 2019-08-14 DIAGNOSIS — M4722 Other spondylosis with radiculopathy, cervical region: Secondary | ICD-10-CM | POA: Diagnosis not present

## 2019-08-14 DIAGNOSIS — M4726 Other spondylosis with radiculopathy, lumbar region: Secondary | ICD-10-CM | POA: Diagnosis not present

## 2019-08-14 DIAGNOSIS — M9902 Segmental and somatic dysfunction of thoracic region: Secondary | ICD-10-CM | POA: Diagnosis not present

## 2019-08-14 DIAGNOSIS — M9903 Segmental and somatic dysfunction of lumbar region: Secondary | ICD-10-CM | POA: Diagnosis not present

## 2019-08-24 ENCOUNTER — Other Ambulatory Visit: Payer: Self-pay | Admitting: Sports Medicine

## 2019-08-24 DIAGNOSIS — M779 Enthesopathy, unspecified: Secondary | ICD-10-CM

## 2019-08-31 ENCOUNTER — Other Ambulatory Visit: Payer: Self-pay

## 2019-09-03 DIAGNOSIS — J45909 Unspecified asthma, uncomplicated: Secondary | ICD-10-CM | POA: Diagnosis not present

## 2019-09-03 DIAGNOSIS — R05 Cough: Secondary | ICD-10-CM | POA: Diagnosis not present

## 2019-09-03 DIAGNOSIS — Z20828 Contact with and (suspected) exposure to other viral communicable diseases: Secondary | ICD-10-CM | POA: Diagnosis not present

## 2019-09-03 DIAGNOSIS — R509 Fever, unspecified: Secondary | ICD-10-CM | POA: Diagnosis not present

## 2019-09-06 DIAGNOSIS — J988 Other specified respiratory disorders: Secondary | ICD-10-CM | POA: Diagnosis not present

## 2019-09-06 DIAGNOSIS — R05 Cough: Secondary | ICD-10-CM | POA: Diagnosis not present

## 2019-09-13 ENCOUNTER — Encounter: Payer: Self-pay | Admitting: Gastroenterology

## 2019-09-13 ENCOUNTER — Other Ambulatory Visit: Payer: Self-pay

## 2019-09-13 ENCOUNTER — Ambulatory Visit (INDEPENDENT_AMBULATORY_CARE_PROVIDER_SITE_OTHER): Payer: PPO | Admitting: Gastroenterology

## 2019-09-13 VITALS — BP 140/86 | HR 103 | Ht 62.0 in | Wt 202.2 lb

## 2019-09-13 DIAGNOSIS — K449 Diaphragmatic hernia without obstruction or gangrene: Secondary | ICD-10-CM | POA: Diagnosis not present

## 2019-09-13 DIAGNOSIS — K760 Fatty (change of) liver, not elsewhere classified: Secondary | ICD-10-CM

## 2019-09-13 DIAGNOSIS — R1032 Left lower quadrant pain: Secondary | ICD-10-CM

## 2019-09-13 NOTE — Progress Notes (Signed)
Chief Complaint: Abdominal pain.  Referring Provider:  Ernestene Kiel, MD      ASSESSMENT AND PLAN;   #1. LLQ pain, neg CT 07/2019.  Likely musculoskeletal. R/O other causes.  #2. Fatty liver with Nl LFTs on CT 07/2019  #3. GERD with small HH. EGD 05/2013  #4.  IBS with alternating constipation and diarrhea.   Plan: -Wt loss.  Discussed in detail as treatment for fatty liver.  Aim is to reduce 6 pounds over the next 3 months.  She would start walking, watch calorie intake, avoid sodas and fried foods. -Next colon due 11/2020 -Heating pads/Biofreeze for musculoskeletal component of pain. -Continue dexilant for now. -FU in 6 months.   HPI:    Autumn Carter is a 64 y.o. female   LUQ pain/LLQ pain after 1 week of J&J Covid vaccine with associated neuropathy, has responded to Kenalog.  Currently does feel better.  Was sharp, nonradiating.  Underwent extensive work-up including CT Abdo/pelvis with p.o. and IV contrast July 26, 2019 which was negative except for fatty liver.  She has progressively gained weight.  Denies having any nausea/vomiting.  Her abdominal pain is somewhat better.  No fever chills or night sweats currently.  She is recovering slowly but steadily now.  Has alternating diarrhea and constipation attributed to IBS.   Past GI procedures: -EGD 05/10/2013: Small HH, incidental hyperplastic/fundic gland gastric polyps, mild gastritis. Neg Bx for HP, neg SB Bx for celiac. -Colonoscopy 11/2015: Colonic diverticulosis, internal hemorrhoids.  Recommend to repeat in 5 years due to strong family history of colonic polyps (mom) -CT Abdo/pelvis July 26, 2019: Fatty liver, no acute abnormalities. S/P cholecystectomy and hysterectomy, aortic atherosclerosis.  At [facility].  Report sent for scanning.  Past Medical History:  Diagnosis Date  . Allergic rhinitis   . Anxiety   . Asthma   . Asthma   . Dry eye   . Family history of colon cancer   . Family history of colonic  polyps   . Fatty liver   . Gastric polyp   . GERD (gastroesophageal reflux disease)   . Gout   . History of breast cancer   . History of colon polyps   . History of kidney stones   . History of shingles   . IBS (irritable bowel syndrome)    with constipation and diarrhea  . Internal hemorrhage   . Migraine   . Pneumonia   . PUD (peptic ulcer disease)   . Rosacea   . UTI (urinary tract infection)     Past Surgical History:  Procedure Laterality Date  . ABDOMINAL HYSTERECTOMY    . BREAST LUMPECTOMY Left 01/30/2009   And radiotherapy  . CATARACT EXTRACTION    . CHOLECYSTECTOMY    . COLONOSCOPY  12/01/2015   Diverticulosis of colon. Internal hemorrhoids.  . ESOPHAGOGASTRODUODENOSCOPY  05/10/2013   Small hiatal hernia. Incidental gastric polyps (status post polypectomy x3). Mild gastritis  . KNEE SURGERY Right    s/p arthroscopic surgery  . LAPAROSCOPIC HYSTERECTOMY  12/04/2009   Complete with bladder tack  . NOSE SURGERY  1976    Family History  Problem Relation Age of Onset  . Hypertension Mother   . Colon polyps Mother   . Hypertension Father   . Hypertension Sister   . Breast cancer Maternal Aunt   . Breast cancer Paternal Aunt   . Breast cancer Cousin     Social History   Tobacco Use  . Smoking status: Never Smoker  .  Smokeless tobacco: Never Used  Vaping Use  . Vaping Use: Never used  Substance Use Topics  . Alcohol use: No  . Drug use: No    Current Outpatient Medications  Medication Sig Dispense Refill  . Albuterol Sulfate (PROAIR HFA IN) Inhale into the lungs as needed.    . ASMANEX 60 METERED DOSES 220 MCG/INH inhaler INHALE 2 PUFFS IN THE EVENING ONCE A DAY  2  . budesonide (RHINOCORT ALLERGY) 32 MCG/ACT nasal spray Place 1 spray into both nostrils daily.    . cloNIDine (CATAPRES - DOSED IN MG/24 HR) 0.2 mg/24hr patch APPLY 1 PATCH TO SKIN ONCE PER WEEK TRANSDERMAL 90 DAYS  1  . dexlansoprazole (DEXILANT) 60 MG capsule Take 60 mg by mouth daily.     . diazepam (VALIUM) 5 MG tablet Take 2.5-5 mg by mouth 2 (two) times daily as needed for anxiety (sleep).     . famotidine (PEPCID) 20 MG tablet Can take one tablet by mouth once daily at bedtime if needed. 30 tablet 5  . levalbuterol (XOPENEX) 1.25 MG/3ML nebulizer solution INHALE 3MLS EVERY 8 HOURS    . mometasone (ASMANEX, 30 METERED DOSES,) 220 MCG/INH inhaler INHALE ONE DOSE ONCE DAILY TO PREVENT COUGH OR WHEEZE **RINSE, GARGLE, AND SPIT AFTER USE** 3 Inhaler 0  . montelukast (SINGULAIR) 10 MG tablet Take 10 mg by mouth at bedtime.    Marland Kitchen nystatin (MYCOSTATIN) 100000 UNIT/ML suspension SWISH AND SPIT 5 MLS FOUR TIMES A DAY FOR MOUTH/THROAT FOR 7 DAYS    . ondansetron (ZOFRAN-ODT) 4 MG disintegrating tablet Take 4 mg by mouth 3 (three) times daily as needed for nausea or vomiting.    . Probiotic Product (PROBIOTIC PO) Take by mouth daily.    Marland Kitchen tiZANidine (ZANAFLEX) 2 MG tablet Take 2 mg by mouth every 8 (eight) hours as needed for muscle spasms.    . traMADol (ULTRAM) 50 MG tablet TAKE 1 TABLET BY MOUTH EVERY 6 HOURS AS NEEDED FOR 5 DAYS  0  . valsartan (DIOVAN) 320 MG tablet Take 320 mg by mouth daily.    . Vitamin D, Cholecalciferol, 1000 units TABS Take by mouth daily.     No current facility-administered medications for this visit.    Allergies  Allergen Reactions  . Ace Inhibitors Swelling  . Amlodipine Other (See Comments)    Abdominal pain.  . Amoxicillin   . Arnuity Ellipta [Fluticasone Propionate (Inhal)] Other (See Comments)    Eye issues  . Benicar Hct [Olmesartan Medoxomil-Hctz] Swelling    Tongue and Lip swelling  . Beta Adrenergic Blockers     Eye Infections.   . Cephalexin   . Cephalosporins Other (See Comments)    Unknown  . Clindamycin/Lincomycin   . Clotrimazole     Regurgitation   . Codeine Sulfate   . Doxazosin Other (See Comments)    pain  . Flovent Hfa [Fluticasone]     Heartburn  . Hydralazine Hcl   . Hydrochlorothiazide   . Lansoprazole   .  Levaquin [Levofloxacin In D5w]   . Macrobid [employer] Macro]   . Metoprolol Tartrate   . Morphine And Related Other (See Comments)    hallucinations    . Moxifloxacin Hcl     Pain in arms and right wrist  . Naltrexone Nausea Only  . Nasonex [Mometasone Furoate]   . Nifedipine Other (See Comments)    Headache   . Olmesartan     Olmesartan Medoxomil: trembling, stomach upset  . Prednisone  High dose = Increase in GERD.  Marland Kitchen Propoxyphene Other (See Comments)    Unknown  . Pulmicort [Budesonide]     Heartburn  . Qvar [Beclomethasone] Nausea Only and Other (See Comments)    Runny nose  . Ranitidine Itching and Swelling  . Silvadene [Silver Sulfadiazine]     Didn't feel well  . Singulair [Montelukast Sodium] Other (See Comments)    heartburn  . Spironolactone Other (See Comments)    Worsened GERD  . Sulfa Antibiotics   . Tobramycin     Eye redness, cheek redness, breast mucus leakage  . Voltaren [Diclofenac] Swelling and Other (See Comments)    Asthma  . Zestril [Lisinopril]   . Zofran [Ondansetron]     Constipation    Review of Systems:  Constitutional: Denies fever, chills, diaphoresis, appetite change and fatigue.  HEENT: Has allergies.   Respiratory: Denies SOB, DOE, has cough, No chest tightness,  and wheezing.   Cardiovascular: Denies chest pain, palpitations and leg swelling.  Genitourinary: Has urine leakage during coughing. Musculoskeletal: Denies myalgias, has back pain, joint swelling, arthralgias and gait problem.  Skin: No rash.  Neurological: Denies dizziness, seizures, syncope, weakness, light-headedness, numbness and has headaches.  Hematological: Denies adenopathy. Easy bruising, personal or family bleeding history  Psychiatric/Behavioral: Has anxiety or depression     Physical Exam:    BP 140/86   Pulse (!) 103   Ht 5\' 2"  (1.575 m)   Wt 202 lb 4 oz (91.7 kg)   BMI 36.99 kg/m  Wt Readings from Last 3 Encounters:  09/13/19 202 lb  4 oz (91.7 kg)  06/23/19 198 lb 9.6 oz (90.1 kg)  06/22/18 195 lb 9.6 oz (88.7 kg)   Constitutional:  Well-developed, in no acute distress. Psychiatric: Normal mood and affect. Behavior is normal. HEENT: Pupils normal.  Conjunctivae are normal. No scleral icterus. Cardiovascular: Normal rate, regular rhythm. No edema Pulmonary/chest: Effort normal and breath sounds normal. No wheezing, rales or rhonchi. Abdominal: Soft, nondistended. Nontender. Bowel sounds active throughout. There are no masses palpable. No hepatomegaly. Rectal:  defered Neurological: Alert and oriented to person place and time. Skin: Skin is warm and dry. No rashes noted.  Data Reviewed: I have personally reviewed following labs and imaging studies     Carmell Austria, MD 09/13/2019, 3:05 PM  Cc: Ernestene Kiel, MD

## 2019-09-13 NOTE — Patient Instructions (Signed)
Apply heating pads to effected area as needed  Apply Bio Freeze to effected areas as needed.  Call in 2 weeks to report how you are feeling  RTO in 6 months  Thank you,  Dr. Jackquline Denmark

## 2019-09-30 DIAGNOSIS — C50219 Malignant neoplasm of upper-inner quadrant of unspecified female breast: Secondary | ICD-10-CM | POA: Diagnosis not present

## 2019-09-30 DIAGNOSIS — Z853 Personal history of malignant neoplasm of breast: Secondary | ICD-10-CM | POA: Diagnosis not present

## 2019-10-17 DIAGNOSIS — Z20828 Contact with and (suspected) exposure to other viral communicable diseases: Secondary | ICD-10-CM | POA: Diagnosis not present

## 2019-11-10 DIAGNOSIS — R7301 Impaired fasting glucose: Secondary | ICD-10-CM | POA: Diagnosis not present

## 2019-11-10 DIAGNOSIS — I10 Essential (primary) hypertension: Secondary | ICD-10-CM | POA: Diagnosis not present

## 2019-11-10 DIAGNOSIS — R5383 Other fatigue: Secondary | ICD-10-CM | POA: Diagnosis not present

## 2019-11-10 DIAGNOSIS — D72829 Elevated white blood cell count, unspecified: Secondary | ICD-10-CM | POA: Diagnosis not present

## 2019-11-10 DIAGNOSIS — M109 Gout, unspecified: Secondary | ICD-10-CM | POA: Diagnosis not present

## 2019-11-10 DIAGNOSIS — Z79899 Other long term (current) drug therapy: Secondary | ICD-10-CM | POA: Diagnosis not present

## 2019-11-15 DIAGNOSIS — J4541 Moderate persistent asthma with (acute) exacerbation: Secondary | ICD-10-CM | POA: Diagnosis not present

## 2019-11-15 DIAGNOSIS — Z1152 Encounter for screening for COVID-19: Secondary | ICD-10-CM | POA: Diagnosis not present

## 2019-12-14 DIAGNOSIS — Z1331 Encounter for screening for depression: Secondary | ICD-10-CM | POA: Diagnosis not present

## 2019-12-14 DIAGNOSIS — F419 Anxiety disorder, unspecified: Secondary | ICD-10-CM | POA: Diagnosis not present

## 2019-12-14 DIAGNOSIS — Z0000 Encounter for general adult medical examination without abnormal findings: Secondary | ICD-10-CM | POA: Diagnosis not present

## 2019-12-14 DIAGNOSIS — J4520 Mild intermittent asthma, uncomplicated: Secondary | ICD-10-CM | POA: Diagnosis not present

## 2019-12-14 DIAGNOSIS — I10 Essential (primary) hypertension: Secondary | ICD-10-CM | POA: Diagnosis not present

## 2019-12-14 DIAGNOSIS — I700 Atherosclerosis of aorta: Secondary | ICD-10-CM | POA: Diagnosis not present

## 2019-12-14 DIAGNOSIS — Z1339 Encounter for screening examination for other mental health and behavioral disorders: Secondary | ICD-10-CM | POA: Diagnosis not present

## 2019-12-14 DIAGNOSIS — K219 Gastro-esophageal reflux disease without esophagitis: Secondary | ICD-10-CM | POA: Diagnosis not present

## 2019-12-14 DIAGNOSIS — Z7189 Other specified counseling: Secondary | ICD-10-CM | POA: Diagnosis not present

## 2019-12-14 DIAGNOSIS — Z6838 Body mass index (BMI) 38.0-38.9, adult: Secondary | ICD-10-CM | POA: Diagnosis not present

## 2019-12-20 DIAGNOSIS — M9902 Segmental and somatic dysfunction of thoracic region: Secondary | ICD-10-CM | POA: Diagnosis not present

## 2019-12-20 DIAGNOSIS — M5388 Other specified dorsopathies, sacral and sacrococcygeal region: Secondary | ICD-10-CM | POA: Diagnosis not present

## 2019-12-20 DIAGNOSIS — M47894 Other spondylosis, thoracic region: Secondary | ICD-10-CM | POA: Diagnosis not present

## 2019-12-20 DIAGNOSIS — M47897 Other spondylosis, lumbosacral region: Secondary | ICD-10-CM | POA: Diagnosis not present

## 2019-12-20 DIAGNOSIS — M9905 Segmental and somatic dysfunction of pelvic region: Secondary | ICD-10-CM | POA: Diagnosis not present

## 2019-12-20 DIAGNOSIS — M9904 Segmental and somatic dysfunction of sacral region: Secondary | ICD-10-CM | POA: Diagnosis not present

## 2019-12-27 ENCOUNTER — Other Ambulatory Visit: Payer: Self-pay

## 2019-12-27 ENCOUNTER — Ambulatory Visit (INDEPENDENT_AMBULATORY_CARE_PROVIDER_SITE_OTHER): Payer: PPO | Admitting: Allergy and Immunology

## 2019-12-27 ENCOUNTER — Encounter: Payer: Self-pay | Admitting: Allergy and Immunology

## 2019-12-27 VITALS — BP 124/86 | HR 93 | Resp 18

## 2019-12-27 DIAGNOSIS — J3089 Other allergic rhinitis: Secondary | ICD-10-CM

## 2019-12-27 DIAGNOSIS — K219 Gastro-esophageal reflux disease without esophagitis: Secondary | ICD-10-CM

## 2019-12-27 DIAGNOSIS — J449 Chronic obstructive pulmonary disease, unspecified: Secondary | ICD-10-CM

## 2019-12-27 DIAGNOSIS — J4489 Other specified chronic obstructive pulmonary disease: Secondary | ICD-10-CM

## 2019-12-27 MED ORDER — NYSTATIN 100000 UNIT/ML MT SUSP: OROMUCOSAL | 1 refills | Status: DC

## 2019-12-27 NOTE — Progress Notes (Signed)
[facility]   Follow-up Note  Referring Provider: Ernestene Kiel, MD Primary Provider: Ernestene Kiel, MD Date of Office Visit: 12/27/2019  Subjective:   Autumn Carter (DOB: Aug 17, 1955) is a 64 y.o. female who returns to the [facility] on 12/27/2019 in re-evaluation of the following:  HPI: Ravon returns to this clinic in evaluation of asthma and allergic rhinitis and reflux.  Her last visit to this clinic was 23 Jun 2019.  She has noticed over the course of the past several months that she was getting more dyspnea on exertion.  When she would walk up a hill she would just become breathless.  This was a progressive issue.  She subsequently was treated with a Kenalog injection in early October by her primary care doctor and almost all of this issue had resolved.  She was tested for Covid on October 11 and apparently this test was negative.  She was using her short acting bronchodilator around that point in time and that actually helped her dyspnea on exertion as well.  She increased her Asmanex to 2 inhalations once a day and apparently developed an issue with thrush and use some nystatin oral solution which cleared up that issue. Otherwise, she really has no significant respiratory tract symptoms.  She has had no problems with her reflux while using Dexilant.  She is very leery about receiving a Covid vaccine.  She did receive a J&J Covid vaccine in April and developed very significant myalgia for 3 weeks and does not want to receive another vaccine.  Allergies as of 12/27/2019      Reactions   Ace Inhibitors Swelling   Amlodipine Other (See Comments)   Abdominal pain.   Amoxicillin    Arnuity Ellipta [fluticasone Propionate (inhal)] Other (See Comments)   Eye issues   Benicar Hct [olmesartan Medoxomil-hctz] Swelling   Tongue and Lip swelling   Beta Adrenergic Blockers    Eye Infections.    Cephalexin      Cephalosporins Other (See Comments)   Unknown   Clindamycin/lincomycin    Clotrimazole    Regurgitation   Codeine Sulfate    Covid-19 Ad26 Vaccine(janssen)    Doxazosin Other (See Comments)   pain   Flovent Hfa [fluticasone]    Heartburn   Hydralazine Hcl    Hydrochlorothiazide    Kidney swelling   Lansoprazole    Levaquin [levofloxacin In D5w]    Macrobid [nitrofurantoin Monohyd Macro]    Macrobid [nitrofurantoin]    Cough   Metoprolol Tartrate    Morphine And Related Other (See Comments)   hallucinations     Moxifloxacin Hcl    Pain in arms and right wrist   Naltrexone Nausea Only   Nasonex [mometasone Furoate]    Nifedipine Other (See Comments)   Headache   Olmesartan    Olmesartan Medoxomil: trembling, stomach upset   Prednisone    High dose = Increase in GERD.   Propoxyphene Other (See Comments)   Unknown   Pulmicort [budesonide]    Heartburn   Qvar [beclomethasone] Nausea Only, Other (See Comments)   Runny nose   Ranitidine Itching, Swelling   Silvadene [silver Sulfadiazine]    Didn't feel well   Singulair [montelukast Sodium] Other (See Comments)   heartburn   Spironolactone Other (See Comments)   Worsened GERD   Sulfa Antibiotics    Tobramycin    Eye redness, cheek redness, breast mucus leakage  Voltaren [diclofenac] Swelling, Other (See Comments)   Asthma   Zestril [lisinopril]    Zofran [ondansetron]    Constipation      Medication List      ALIGN PO Take 1 tablet by mouth daily.   AMBULATORY NON FORMULARY MEDICATION Magical-D magnesium plus vitamin D   Asmanex (60 Metered Doses) 220 MCG/INH inhaler Generic drug: mometasone INHALE 2 PUFFS IN THE EVENING ONCE A DAY   mometasone 220 MCG/INH inhaler Commonly known as: Asmanex (30 Metered Doses) INHALE ONE DOSE ONCE DAILY TO PREVENT COUGH OR WHEEZE **RINSE, GARGLE, AND SPIT AFTER USE**   calcium citrate-vitamin D 315-200 MG-UNIT tablet Commonly known as: CITRACAL+D Take 1 tablet by  mouth daily.   cloNIDine 0.2 mg/24hr patch Commonly known as: CATAPRES - Dosed in mg/24 hr APPLY 1 PATCH TO SKIN ONCE PER WEEK TRANSDERMAL 90 DAYS   Dexilant 60 MG capsule Generic drug: dexlansoprazole Take 60 mg by mouth daily.   diazepam 5 MG tablet Commonly known as: VALIUM Take 2.5-5 mg by mouth 2 (two) times daily as needed for anxiety (sleep).   levalbuterol 1.25 MG/3ML nebulizer solution Commonly known as: XOPENEX INHALE 3MLS EVERY 8 HOURS   montelukast 10 MG tablet Commonly known as: SINGULAIR Take 10 mg by mouth at bedtime.   nystatin 100000 UNIT/ML suspension Commonly known as: MYCOSTATIN SWISH AND SPIT 5 MLS FOUR TIMES A DAY FOR MOUTH/THROAT FOR 7 DAYS   ondansetron 4 MG disintegrating tablet Commonly known as: ZOFRAN-ODT Take 4 mg by mouth 3 (three) times daily as needed for nausea or vomiting.   PROAIR HFA IN Inhale into the lungs as needed.   PROBIOTIC PO Take by mouth daily.   Rhinocort Allergy 32 MCG/ACT nasal spray Generic drug: budesonide Place 1 spray into both nostrils daily.   tiZANidine 2 MG tablet Commonly known as: ZANAFLEX Take 2 mg by mouth every 8 (eight) hours as needed for muscle spasms.   traMADol 50 MG tablet Commonly known as: ULTRAM TAKE 1 TABLET BY MOUTH EVERY 6 HOURS AS NEEDED FOR 5 DAYS   valsartan 320 MG tablet Commonly known as: DIOVAN Take 320 mg by mouth daily.   Vitamin D (Cholecalciferol) 25 MCG (1000 UT) Tabs Take by mouth daily.       Past Medical History:  Diagnosis Date  . Allergic rhinitis   . Anxiety   . Asthma   . Asthma   . Dry eye   . Family history of colon cancer   . Family history of colonic polyps   . Fatty liver   . Gastric polyp   . GERD (gastroesophageal reflux disease)   . Gout   . History of breast cancer   . History of colon polyps   . History of kidney stones   . History of shingles   . IBS (irritable bowel syndrome)    with constipation and diarrhea  . Internal hemorrhage   .  Migraine   . Pneumonia   . PUD (peptic ulcer disease)   . Rosacea   . UTI (urinary tract infection)     Past Surgical History:  Procedure Laterality Date  . ABDOMINAL HYSTERECTOMY    . BREAST LUMPECTOMY Left 01/30/2009   And radiotherapy  . CATARACT EXTRACTION    . CHOLECYSTECTOMY    . COLONOSCOPY  12/01/2015   Diverticulosis of colon. Internal hemorrhoids.  . ESOPHAGOGASTRODUODENOSCOPY  05/10/2013   Small hiatal hernia. Incidental gastric polyps (status post polypectomy x3). Mild gastritis  . KNEE SURGERY Right  s/p arthroscopic surgery  . LAPAROSCOPIC HYSTERECTOMY  12/04/2009   Complete with bladder tack  . NOSE SURGERY  1976    Review of systems negative except as noted in HPI / PMHx or noted below:  Review of Systems  Constitutional: Negative.   HENT: Negative.   Eyes: Negative.   Respiratory: Negative.   Cardiovascular: Negative.   Gastrointestinal: Negative.   Genitourinary: Negative.   Musculoskeletal: Negative.   Skin: Negative.   Neurological: Negative.   Endo/Heme/Allergies: Negative.   Psychiatric/Behavioral: Negative.      Objective:   Vitals:   12/27/19 0950  BP: 124/86  Pulse: 93  Resp: 18  SpO2: 96%          Physical Exam Constitutional:      Appearance: She is not diaphoretic.  HENT:     Head: Normocephalic.     Right Ear: Tympanic membrane, ear canal and external ear normal.     Left Ear: Tympanic membrane, ear canal and external ear normal.     Nose: Nose normal. No mucosal edema or rhinorrhea.     Mouth/Throat:     Pharynx: Uvula midline. No oropharyngeal exudate.  Eyes:     Conjunctiva/sclera: Conjunctivae normal.  Neck:     Thyroid: No thyromegaly.     Trachea: Trachea normal. No tracheal tenderness or tracheal deviation.  Cardiovascular:     Rate and Rhythm: Normal rate and regular rhythm.     Heart sounds: Normal heart sounds, S1 normal and S2 normal. No murmur heard.   Pulmonary:     Effort: No respiratory distress.       Breath sounds: Normal breath sounds. No stridor. No wheezing or rales.  Lymphadenopathy:     Head:     Right side of head: No tonsillar adenopathy.     Left side of head: No tonsillar adenopathy.     Cervical: No cervical adenopathy.  Skin:    Findings: No erythema or rash.     Nails: There is no clubbing.  Neurological:     Mental Status: She is alert.     Diagnostics:    Spirometry was performed and demonstrated an FEV1 of 0.92 at 42 % of predicted.  Results of a abdominal CT scan obtained 26 July 2019 identified the following:  Lower chest: No acute abnormality.  Assessment and Plan:   1. COPD with asthma ([place])   2. Other allergic rhinitis   3. LPRD (laryngopharyngeal reflux disease)     1. Continue to Treat inflammation:   A. TRELEGY 200 - 1 inhalations one time per day (Asmanex)  B. OTC Rhinocort one spray each nostril one time per day  2. Continue to Treat reflux:   A. Dexilant 60 mg every other day  3.  Continue to treat intermittent thrush with the following:   A.  Nystatin oral solution-5 mL 3 times per day until resolved  4. Continue ProAir HFA and antihistamine if needed  5. Return to clinic in 4 weeks or earlier if problem. Chest x-ray???  6. Monoclonal antibody infusion for Covid if infected  I am going to have Meklit utilize a triple inhaler and see what the effect of this administration is over the course of the next 4 weeks.  She appears to be developing a little bit more pulmonary symptom and her spirometry suggest a very significant limitation in airflow.  Whether this airflow is all obstructive is not entirely clear.  There may be a more significant process ongoing within her  lung that will require further evaluation.  I will see her back in this clinic in 4 weeks to make a determination about further evaluation and treatment based upon her response.  After our discussion today it does not appear as though she feels comfortable receiving a Covid  vaccine other than her initial J and J vaccine.  I did have a talk with her today about the need to receive a monoclonal anti-RBD antibody infusion if she gets infected with Covid.  Allena Katz, MD Allergy / Immunology [facility]

## 2019-12-27 NOTE — Patient Instructions (Addendum)
  1. Continue to Treat inflammation:   A. TRELEGY 200 - 1 inhalations one time per day (Asmanex)  B. OTC Rhinocort one spray each nostril one time per day  2. Continue to Treat reflux:   A. Dexilant 60 mg every other day  3.  Continue to treat intermittent thrush with the following:   A.  Nystatin oral solution-5 mL 3 times per day until resolved  4. Continue ProAir HFA and antihistamine if needed  5. Return to clinic in 4 weeks or earlier if problem. Chest x-ray???  6. Monoclonal antibody infusion for Covid if infected

## 2019-12-28 ENCOUNTER — Telehealth: Payer: Self-pay

## 2019-12-28 ENCOUNTER — Encounter: Payer: Self-pay | Admitting: Allergy and Immunology

## 2019-12-28 NOTE — Telephone Encounter (Signed)
Patient called and says that after reading the side effect profile for Trelegy, she does not want to start that medication.  Is there another medication she could try?  Please advise.

## 2019-12-28 NOTE — Telephone Encounter (Signed)
I would like for her to try the triple inhaler and if she has problems with a triple inhaler then we will discontinue this agent.  It is not a given that she will develop a problem with this inhaler.  The vast majority of people uses inhaler have no problems when using it.

## 2019-12-29 NOTE — Telephone Encounter (Signed)
Left message on machine.

## 2020-01-04 NOTE — Telephone Encounter (Signed)
Tanetta informed, she will try the Trelegy.

## 2020-01-26 ENCOUNTER — Ambulatory Visit: Payer: PPO | Admitting: Allergy and Immunology

## 2020-02-09 ENCOUNTER — Ambulatory Visit (INDEPENDENT_AMBULATORY_CARE_PROVIDER_SITE_OTHER): Payer: PPO | Admitting: Allergy and Immunology

## 2020-02-09 ENCOUNTER — Encounter: Payer: Self-pay | Admitting: Allergy and Immunology

## 2020-02-09 ENCOUNTER — Other Ambulatory Visit: Payer: Self-pay

## 2020-02-09 VITALS — BP 110/78 | HR 89 | Resp 16

## 2020-02-09 DIAGNOSIS — K219 Gastro-esophageal reflux disease without esophagitis: Secondary | ICD-10-CM | POA: Diagnosis not present

## 2020-02-09 DIAGNOSIS — J4489 Other specified chronic obstructive pulmonary disease: Secondary | ICD-10-CM

## 2020-02-09 DIAGNOSIS — J449 Chronic obstructive pulmonary disease, unspecified: Secondary | ICD-10-CM | POA: Diagnosis not present

## 2020-02-09 DIAGNOSIS — J3089 Other allergic rhinitis: Secondary | ICD-10-CM

## 2020-02-09 NOTE — Patient Instructions (Addendum)
  1. Continue to Treat inflammation:   A. Asmanex 220 - 1-2 inhalations 1-2 times per day (MAX=4 inhalations)  B. OTC BRANDED Rhinocort 1 spray each nostril 1 time per day  2. Continue to Treat reflux:   A. Dexilant 60 mg every other day  3.  Continue to treat intermittent thrush with the following:   A.  Nystatin oral solution-5 mL 3 times per day until resolved  4. Continue ProAir HFA and antihistamine if needed  5. Return to clinic in 6 months or earlier if problem.    6. Monoclonal antibody infusion for Covid if infected

## 2020-02-09 NOTE — Progress Notes (Signed)
[place] [facility] [place] [facility] [place] - [place] - [place]   Follow-up Note  Referring Provider: Philemon Kingdom, MD Primary Provider: Philemon Kingdom, MD Date of Office Visit: 02/09/2020  Subjective:   Autumn Carter (DOB: [birth year]) is a 65 y.o. female who [facility]2020 in re-evaluation of the following:  HPI: Madisin returns to this clinic in evaluation of asthma and allergic rhinitis and reflux.  Her last visit to this clinic was 27 December 2019.  During her last evaluation she was having significant problems with her breathing that required a systemic steroid even in the face of utilizing Asmanex at high dose.  We recommended that she use a triple inhaler to address this issue.  However, for some reason she resolved all of her breathing issues and remained on Asmanex and at this point time is doing very well and does not need to use a short acting bronchodilator and she can exert herself without any difficulty.  She unfortunately cannot find any branded Rhinocort and she has been using a generic Rhinocort and this has producing a tickle in her throat and some drainage in her throat.  It usually works very well for her nose but it is producing this throat issue.  She believes that her reflux is under very good control with her proton pump inhibitor.  She is still very leery about receiving a Covid vaccine booster in conjunction with her previous J&J Covid vaccine.  Allergies as of 02/09/2020      Reactions   Ace Inhibitors Swelling   Amlodipine Other (See Comments)   Abdominal pain.   Amoxicillin    Arnuity Ellipta [fluticasone Propionate (inhal)] Other (See Comments)   Eye issues   Benicar Hct [olmesartan Medoxomil-hctz] Swelling   Tongue and Lip swelling   Beta Adrenergic Blockers    Eye Infections.    Cephalexin    Cephalosporins Other (See Comments)   Unknown   Clindamycin/lincomycin    Clotrimazole    Regurgitation    Codeine Sulfate    Covid-19 Ad26 Vaccine(janssen)    Doxazosin Other (See Comments)   pain   Flovent Hfa [fluticasone]    Heartburn   Hydralazine Hcl    Hydrochlorothiazide    Kidney swelling   Lansoprazole    Levaquin [levofloxacin In D5w]    Macrobid [nitrofurantoin Monohyd Macro]    Macrobid [nitrofurantoin]    Cough   Metoprolol Tartrate    Morphine And Related Other (See Comments)   hallucinations     Moxifloxacin Hcl    Pain in arms and right wrist   Naltrexone Nausea Only   Nasonex [mometasone Furoate]    Nifedipine Other (See Comments)   Headache   Olmesartan    Olmesartan Medoxomil: trembling, stomach upset   Prednisone    High dose = Increase in GERD.   Propoxyphene Other (See Comments)   Unknown   Pulmicort [budesonide]    Heartburn   Qvar [beclomethasone] Nausea Only, Other (See Comments)   Runny nose   Ranitidine Itching, Swelling   Silvadene [silver Sulfadiazine]    Didn't feel well   Singulair [montelukast Sodium] Other (See Comments)   heartburn   Spironolactone Other (See Comments)   Worsened GERD   Sulfa Antibiotics    Tobramycin    Eye redness, cheek redness, breast mucus leakage   Voltaren [diclofenac] Swelling, Other (See Comments)   Asthma   Zestril [lisinopril]    Zofran [ondansetron]    Constipation  Medication List      ALIGN PO Take 1 tablet by mouth daily.   AMBULATORY NON FORMULARY MEDICATION Magical-D magnesium plus vitamin D   Asmanex (60 Metered Doses) 220 MCG/INH inhaler Generic drug: mometasone INHALE 2 PUFFS IN THE EVENING ONCE A DAY   mometasone 220 MCG/INH inhaler Commonly known as: Asmanex (30 Metered Doses) INHALE ONE DOSE ONCE DAILY TO PREVENT COUGH OR WHEEZE **RINSE, GARGLE, AND SPIT AFTER USE**   budesonide 32 MCG/ACT nasal spray Commonly known as: RHINOCORT AQUA Place 1 spray into both nostrils daily.   calcium citrate-vitamin D 315-200 MG-UNIT tablet Commonly known as: CITRACAL+D Take 1 tablet  by mouth daily.   cloNIDine 0.2 mg/24hr patch Commonly known as: CATAPRES - Dosed in mg/24 hr APPLY 1 PATCH TO SKIN ONCE PER WEEK TRANSDERMAL 90 DAYS   dexlansoprazole 60 MG capsule Commonly known as: DEXILANT Take 60 mg by mouth daily.   diazepam 5 MG tablet Commonly known as: VALIUM Take 2.5-5 mg by mouth 2 (two) times daily as needed for anxiety (sleep).   famotidine 20 MG tablet Commonly known as: PEPCID Can take one tablet by mouth once daily at bedtime if needed.   levalbuterol 1.25 MG/3ML nebulizer solution Commonly known as: XOPENEX INHALE EVERY 8 HOURS   montelukast 10 MG tablet Commonly known as: SINGULAIR Take 10 mg by mouth at bedtime.   nystatin 100000 UNIT/ML suspension Commonly known as: MYCOSTATIN SWISH AND SPIT 5 MLS FOUR TIMES A DAY FOR MOUTH/THROAT FOR 7 DAYS   nystatin 100000 UNIT/ML suspension Commonly known as: MYCOSTATIN Swish and swallow 53mL three times daily as directed   ondansetron 4 MG disintegrating tablet Commonly known as: ZOFRAN-ODT Take 4 mg by mouth 3 (three) times daily as needed for nausea or vomiting.   PROAIR HFA IN Inhale into the lungs as needed.   PROBIOTIC PO Take by mouth daily.   tiZANidine 2 MG tablet Commonly known as: ZANAFLEX Take 2 mg by mouth every 8 (eight) hours as needed for muscle spasms.   traMADol 50 MG tablet Commonly known as: ULTRAM TAKE 1 TABLET BY MOUTH EVERY 6 HOURS AS NEEDED FOR 5 DAYS   valsartan 320 MG tablet Commonly known as: DIOVAN Take 320 mg by mouth daily.   Vitamin D (Cholecalciferol) 25 MCG (1000 UT) Tabs Take by mouth daily.       Past Medical History:  Diagnosis Date  . Allergic rhinitis   . Anxiety   . Asthma   . Asthma   . Dry eye   . Family history of colon cancer   . Family history of colonic polyps   . Fatty liver   . Gastric polyp   . GERD (gastroesophageal reflux disease)   . Gout   . History of breast cancer   . History of colon polyps   . History of  kidney stones   . History of shingles   . IBS (irritable bowel syndrome)    with constipation and diarrhea  . Internal hemorrhage   . Migraine   . Pneumonia   . PUD (peptic ulcer disease)   . Rosacea   . UTI (urinary tract infection)     Past Surgical History:  Procedure Laterality Date  . ABDOMINAL HYSTERECTOMY    . BREAST LUMPECTOMY Left 01/30/2009   And radiotherapy  . CATARACT EXTRACTION    . CHOLECYSTECTOMY    . COLONOSCOPY  12/01/2015   Diverticulosis of colon. Internal hemorrhoids.  . ESOPHAGOGASTRODUODENOSCOPY  05/10/2013   Small  hiatal hernia. Incidental gastric polyps (status post polypectomy x3). Mild gastritis  . KNEE SURGERY Right    s/p arthroscopic surgery  . LAPAROSCOPIC HYSTERECTOMY  12/04/2009   Complete with bladder tack  . NOSE SURGERY  1976    Review of systems negative except as noted in HPI / PMHx or noted below:  Review of Systems  Constitutional: Negative.   HENT: Negative.   Eyes: Negative.   Respiratory: Negative.   Cardiovascular: Negative.   Gastrointestinal: Negative.   Genitourinary: Negative.   Musculoskeletal: Negative.   Skin: Negative.   Neurological: Negative.   Endo/Heme/Allergies: Negative.   Psychiatric/Behavioral: Negative.      Objective:   Vitals:   02/09/20 1104  BP: 110/78  Pulse: 89  Resp: 16  SpO2: 94%          Physical Exam Constitutional:      Appearance: She is not diaphoretic.  HENT:     Head: Normocephalic.     Right Ear: Tympanic membrane, ear canal and external ear normal.     Left Ear: Tympanic membrane, ear canal and external ear normal.     Nose: Nose normal. No mucosal edema or rhinorrhea.     Mouth/Throat:     Mouth: Oropharynx is clear and moist and mucous membranes are normal.     Pharynx: Uvula midline. No oropharyngeal exudate.  Eyes:     Conjunctiva/sclera: Conjunctivae normal.  Neck:     Thyroid: No thyromegaly.     Trachea: Trachea normal. No tracheal tenderness or tracheal  deviation.  Cardiovascular:     Rate and Rhythm: Normal rate and regular rhythm.     Heart sounds: Normal heart sounds, S1 normal and S2 normal. No murmur heard.   Pulmonary:     Effort: No respiratory distress.     Breath sounds: Normal breath sounds. No stridor. No wheezing or rales.  Musculoskeletal:        General: No edema.  Lymphadenopathy:     Head:     Right side of head: No tonsillar adenopathy.     Left side of head: No tonsillar adenopathy.     Cervical: No cervical adenopathy.  Skin:    Findings: No erythema or rash.     Nails: There is no clubbing.  Neurological:     Mental Status: She is alert.     Diagnostics:    Spirometry was performed and demonstrated an FEV1 of 1.08 at 50 % of predicted.  She had some difficulty performing the spirometric maneuver.  Assessment and Plan:   1. COPD with asthma ([place])   2. Other allergic rhinitis   3. LPRD (laryngopharyngeal reflux disease)     1. Continue to Treat inflammation:   A. Asmanex 220 - 1-2 inhalations 1-2 times per day (MAX=4 inhalations)  B. OTC BRANDED Rhinocort 1 spray each nostril 1 time per day  2. Continue to Treat reflux:   A. Dexilant 60 mg every other day  3.  Continue to treat intermittent thrush with the following:   A.  Nystatin oral solution-5 mL 3 times per day until resolved  4. Continue ProAir HFA and antihistamine if needed  5. Return to clinic in 6 months or earlier if problem.    6. Monoclonal antibody infusion for Covid if infected  Lakina appears to be doing okay at this point in time on her current medical therapy.  A fair amount of her lung issue was tied up with radiation pneumonitis that apparently developed during therapy  of her cancer and this is an irreversible issue and we are just trying to deal with any reversible component that exists by having her consistently use some anti-inflammatory medications for her lower airway.  Her rhinitis and her reflux with LPR appears to be  under pretty good control at this point.  I will see her back in this clinic in 6 months or earlier if there is a problem.  Allena Katz, MD Allergy / Immunology [place]

## 2020-02-10 ENCOUNTER — Encounter: Payer: Self-pay | Admitting: Allergy and Immunology

## 2020-03-27 ENCOUNTER — Telehealth: Payer: Self-pay

## 2020-03-27 NOTE — Telephone Encounter (Signed)
As discussed before patient has been having issues with nasal sprays and them causing her reactions. I called her back with your recommendations regarding Rhinocort and that she should be able to order it online. She states she tried doing that already and multiple pharmacies inform her she can no longer order it since product is out of the market. She would like to know what other nasal spray she can use. Please advice

## 2020-03-27 NOTE — Telephone Encounter (Signed)
Per Dr. Neldon Mc, can also try Nasacort.  Called and informed patient to try Nasacort and let us know how she does.  I did tell her about the Qnasl, but she has had some trouble with Qvar in the past ( same drug) so wants to try Nasacort first.  I asked patient to keep [place] updated.

## 2020-03-27 NOTE — Telephone Encounter (Signed)
Please see if her insurance company will pay for Qnasl 80 -1-2 puffs each nostril 1 time per day

## 2020-03-29 DIAGNOSIS — R300 Dysuria: Secondary | ICD-10-CM | POA: Diagnosis not present

## 2020-03-29 DIAGNOSIS — N390 Urinary tract infection, site not specified: Secondary | ICD-10-CM | POA: Diagnosis not present

## 2020-03-29 DIAGNOSIS — R1030 Lower abdominal pain, unspecified: Secondary | ICD-10-CM | POA: Diagnosis not present

## 2020-03-30 ENCOUNTER — Encounter: Payer: Self-pay | Admitting: Oncology

## 2020-03-31 DIAGNOSIS — R109 Unspecified abdominal pain: Secondary | ICD-10-CM | POA: Diagnosis not present

## 2020-03-31 DIAGNOSIS — R1030 Lower abdominal pain, unspecified: Secondary | ICD-10-CM | POA: Diagnosis not present

## 2020-04-07 ENCOUNTER — Ambulatory Visit: Payer: PPO | Admitting: Oncology

## 2020-04-18 DIAGNOSIS — Z79899 Other long term (current) drug therapy: Secondary | ICD-10-CM | POA: Diagnosis not present

## 2020-04-18 DIAGNOSIS — Z1231 Encounter for screening mammogram for malignant neoplasm of breast: Secondary | ICD-10-CM | POA: Diagnosis not present

## 2020-04-18 DIAGNOSIS — K219 Gastro-esophageal reflux disease without esophagitis: Secondary | ICD-10-CM | POA: Diagnosis not present

## 2020-04-18 DIAGNOSIS — R7301 Impaired fasting glucose: Secondary | ICD-10-CM | POA: Diagnosis not present

## 2020-04-18 DIAGNOSIS — M722 Plantar fascial fibromatosis: Secondary | ICD-10-CM | POA: Diagnosis not present

## 2020-04-18 DIAGNOSIS — Z6839 Body mass index (BMI) 39.0-39.9, adult: Secondary | ICD-10-CM | POA: Diagnosis not present

## 2020-04-18 DIAGNOSIS — I10 Essential (primary) hypertension: Secondary | ICD-10-CM | POA: Diagnosis not present

## 2020-04-20 NOTE — Progress Notes (Signed)
[facility]  [address] [place],  [place]  [address] [phone]  Clinic Day:  04/21/2020  Referring physician: Ernestene Kiel, MD  This document serves as a record of services personally performed by Hosie Poisson, MD. It was created on their behalf by Curry,Lauren E, a trained medical scribe. The creation of this record is based on the scribe's personal observations and the provider's statements to them.  CHIEF COMPLAINT:  CC: History of stage 0 breast cancer  Current Treatment:  Surveillance   HISTORY OF PRESENT ILLNESS:  Autumn Carter is a 65 y.o. female with a history of stage 0 breast cancer diagnosed in December of 2010 and treated with a lumpectomy.  Pathology revealed a 1.6 cm grade 2 ductal carcinoma in situ with an area of focal necrosis for a Tis N0 M0.  Estrogen and progesterone receptors were positive and she was treated with postoperative radiation.  She declined chemoprevention and is on yearly follow-up.  Dr. Noberto Retort last did mammography in January of this year and has scheduled her next one for February 22, 2016. She had acupuncture yesterday which has helped her and she does have asthma with chronic dyspnea.  She has had cataract surgery.  She has not taken her blood pressure medication yet today.  She denies pain other than occasional aching of her legs.  She has had a hysterectomy in the past by Dr. Kimberlee Nearing.  She did have BRCA testing which was negative.  Her family history is extensive for malignancy.  Her maternal aunt had breast cancer and a paternal aunt had breast cancer.  Her maternal grandmother had bilateral mastectomies for breast cancer at age 44 but still lived to be 70. A paternal great aunt had breast cancer.  Her paternal grandmother had a malignancy of her face but later died of colon cancer.  A paternal niece had breast cancer and had bilateral mastectomies and a paternal uncle had some form of  intestinal malignancy as well as a grandfather with possible stomach cancer.  The patient 's mother has had gastrointestinal upset and had 20 polyps removed on her last colonoscopy.  We do not know whether these were hyperplastic or adenomatous.  Based on the patient 's personal and family history, she is a candidate for update genetic testing by national guidelines, and this was done last year.  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  She had an injury to her lower right leg from a nail last year and apparently this became infected and she was on antibiotics.  In June of 2018, she had problems with shortness of breath and abnormal D-dimer, but a CT of the chest was negative.  An ultrasound for deep venous thrombosis was negative, and an echocardiogram was also normal.  Bilateral mammogram in February of 2019 was clear.  Dr. Laqueta Due sees her for regular follow-up and labs.  She is now retired.  INTERVAL HISTORY:  Autumn Carter is here for routine follow up and states that she has been well.  Annual bilateral mammogram from March 2022 was clear.  She does report a severe allergic reaction to Rhinocort nasal spray which caused whole body myalgias/arthralgias.  She will be due for annual colonoscopy with Dr. Lyndel Safe this year.  Her  appetite is good, and her weight is stable since her last visit.  She denies fever, chills or other signs of infection.  She denies nausea, vomiting, bowel issues, or abdominal pain.  She denies sore throat, cough, dyspnea, or chest pain.  REVIEW OF SYSTEMS:  Review of Systems  Constitutional: Negative.  Negative for appetite change, chills, fatigue, fever and unexpected weight change.  HENT:  Negative.   Eyes: Negative.   Respiratory: Negative.  Negative for chest tightness, cough, hemoptysis, shortness of breath and wheezing.   Cardiovascular: Negative.  Negative for chest pain, leg swelling  and palpitations.  Gastrointestinal: Negative.  Negative for abdominal distention, abdominal pain, blood in stool, constipation, diarrhea, nausea and vomiting.  Endocrine: Negative.   Genitourinary: Negative.  Negative for difficulty urinating, dysuria, frequency and hematuria.   Musculoskeletal: Negative.  Negative for arthralgias, back pain, flank pain, gait problem and myalgias.  Skin: Negative.   Neurological: Negative.  Negative for dizziness, extremity weakness, gait problem, headaches, light-headedness, numbness, seizures and speech difficulty.  Hematological: Negative.   Psychiatric/Behavioral: Negative.  Negative for depression and sleep disturbance. The patient is not nervous/anxious.      VITALS:  Blood pressure (!) 164/87, pulse 89, temperature 98.3 F (36.8 C), temperature source Oral, resp. rate 18, height 5' 1" (1.549 m), weight 216 lb 8 oz (98.2 kg), SpO2 97 %.  Wt Readings from Last 3 Encounters:  04/21/20 216 lb 8 oz (98.2 kg)  09/13/19 202 lb 4 oz (91.7 kg)  06/23/19 198 lb 9.6 oz (90.1 kg)    Body mass index is 40.91 kg/m.  Performance status (ECOG): 0 - Asymptomatic  PHYSICAL EXAM:  Physical Exam Constitutional:      General: She is not in acute distress.    Appearance: Normal appearance. She is normal weight.  HENT:     Head: Normocephalic and atraumatic.  Eyes:     General: No scleral icterus.    Extraocular Movements: Extraocular movements intact.     Conjunctiva/sclera: Conjunctivae normal.     Pupils: Pupils are equal, round, and reactive to light.  Cardiovascular:     Rate and Rhythm: Normal rate and regular rhythm.     Pulses: Normal pulses.     Heart sounds: Normal heart sounds. No murmur heard. No friction rub. No gallop.   Pulmonary:     Effort: Pulmonary effort is normal. No respiratory distress.     Breath sounds: Normal breath sounds.  Chest:  Breasts:     Right: Normal.     Left: Normal.      Comments: She has partial inversion of  bilateral nipples.  She has a deep scar in the upper inner quadrant of the left breast, which is well healed.  No masses in either breast. Abdominal:     General: Bowel sounds are normal. There is no distension.     Palpations: Abdomen is soft. There is no hepatomegaly, splenomegaly or mass.     Tenderness: There is no abdominal tenderness.  Musculoskeletal:        General: Normal range of motion.     Cervical back: Normal range of motion and neck supple.     Right lower leg: No edema.     Left lower leg: No edema.  Lymphadenopathy:     Cervical: No cervical adenopathy.  Skin:    General: Skin is warm and dry.     Comments: She has a lipoma of the right forearm.  Neurological:     General: No focal deficit present.     Mental Status: She is alert and oriented to person, place, and time. Mental status is at  baseline.  Psychiatric:        Mood and Affect: Mood normal.        Behavior: Behavior normal.        Thought Content: Thought content normal.        Judgment: Judgment normal.     LABS:  No flowsheet data found. No flowsheet data found.  STUDIES:   EXAM: 04/18/2020 DIGITAL SCREENING BILATERAL MAMMOGRAM WITH TOMOSYNTHESIS AND CAD  TECHNIQUE: Bilateral screening digital craniocaudal and mediolateral oblique mammograms were obtained. Bilateral screening digital breast tomosynthesis was performed. The images were evaluated with computer-aided detection.  COMPARISON:  Previous exam(s).  ACR Breast Density Category b: There are scattered areas of fibroglandular density.  FINDINGS: There are no findings suspicious for malignancy. The images were evaluated with computer-aided detection.  IMPRESSION: No mammographic evidence of malignancy. A result letter of this screening mammogram will be mailed directly to the patient.  RECOMMENDATION: Screening mammogram in one year. (Code:SM-B-01Y)   Allergies:  Allergies  Allergen Reactions  . Ace Inhibitors Swelling  .  Amlodipine Other (See Comments)    Abdominal pain.  . Amoxicillin   . Arnuity Ellipta [Fluticasone Propionate (Inhal)] Other (See Comments)    Eye issues  . Benicar Hct [Olmesartan Medoxomil-Hctz] Swelling    Tongue and Lip swelling  . Beta Adrenergic Blockers     Eye Infections.   . Cephalexin   . Cephalosporins Other (See Comments)    Unknown  . Clindamycin/Lincomycin   . Clotrimazole     Regurgitation   . Codeine Sulfate   . Covid-19 Ad26 Vaccine(Janssen)   . Doxazosin Other (See Comments)    pain  . Flovent Hfa [Fluticasone]     Heartburn  . Hydralazine Hcl   . Hydrochlorothiazide     Kidney swelling  . Lansoprazole   . Levaquin [Levofloxacin In D5w]   . Macrobid [employer] Macro]   . Macrobid [Nitrofurantoin]     Cough  . Metoprolol Tartrate   . Morphine And Related Other (See Comments)    hallucinations    . Moxifloxacin Hcl     Pain in arms and right wrist  . Naltrexone Nausea Only  . Nasonex [Mometasone Furoate]   . Nifedipine Other (See Comments)    Headache   . Olmesartan     Olmesartan Medoxomil: trembling, stomach upset  . Prednisone     High dose = Increase in GERD.  Marland Kitchen Propoxyphene Other (See Comments)    Unknown  . Pulmicort [Budesonide]     Body ached all over---took over the counter:  Equate nasal spray form of this medication per patient '  . Qvar [Beclomethasone] Nausea Only and Other (See Comments)    Runny nose  . Ranitidine Itching and Swelling  . Silvadene [Silver Sulfadiazine]     Didn't feel well  . Singulair [Montelukast Sodium] Other (See Comments)    heartburn  . Spironolactone Other (See Comments)    Worsened GERD  . Sulfa Antibiotics   . Tobramycin     Eye redness, cheek redness, breast mucus leakage  . Voltaren [Diclofenac] Swelling and Other (See Comments)    Asthma  . Zestril [Lisinopril]   . Zofran [Ondansetron]     Constipation    Current Medications: Current Outpatient Medications  Medication Sig  Dispense Refill  . Albuterol Sulfate (PROAIR HFA IN) Inhale into the lungs as needed.    . ASMANEX 60 METERED DOSES 220 MCG/INH inhaler INHALE 2 PUFFS IN THE EVENING ONCE A DAY  2  .  calcium citrate-vitamin D (CITRACAL+D) 315-200 MG-UNIT tablet Take 1 tablet by mouth daily.    . cloNIDine (CATAPRES - DOSED IN MG/24 HR) 0.2 mg/24hr patch APPLY 1 PATCH TO SKIN ONCE PER WEEK TRANSDERMAL 90 DAYS  1  . dexlansoprazole (DEXILANT) 60 MG capsule Take 60 mg by mouth daily.    . diazepam (VALIUM) 5 MG tablet Take 2.5-5 mg by mouth 2 (two) times daily as needed for anxiety (sleep).     Marland Kitchen levalbuterol (XOPENEX) 1.25 MG/3ML nebulizer solution INHALE 3MLS EVERY 8 HOURS    . montelukast (SINGULAIR) 10 MG tablet Take 10 mg by mouth at bedtime.    Marland Kitchen nystatin (MYCOSTATIN) 100000 UNIT/ML suspension Swish and swallow 5m three times daily as directed 450 mL 1  . ondansetron (ZOFRAN-ODT) 4 MG disintegrating tablet Take 4 mg by mouth 3 (three) times daily as needed for nausea or vomiting.    . Probiotic Product (ALIGN PO) Take 1 tablet by mouth daily.    .Marland KitchentiZANidine (ZANAFLEX) 2 MG tablet Take 2 mg by mouth every 8 (eight) hours as needed for muscle spasms.    . traMADol (ULTRAM) 50 MG tablet TAKE 1 TABLET BY MOUTH EVERY 6 HOURS AS NEEDED FOR 5 DAYS  0  . valsartan (DIOVAN) 320 MG tablet Take 320 mg by mouth daily.    . Vitamin D, Cholecalciferol, 1000 units TABS Take by mouth daily.     No current facility-administered medications for this visit.     ASSESSMENT & PLAN:   Assessment:   1.  Stage 0 breast cancer, diagnosed in December 2010.  She remains without evidence of recurrence.  Plan: She continues to do well, so we will see her back in 1 year with bilateral mammography.  She understands and agrees with this plan of care.    I provided 15 minutes of face-to-face time during this this encounter and > 50% was spent counseling as documented under my assessment and plan.    CDerwood Kaplan  MD CN[facility] LPAT AB[facility]SHulbertNAlaska[address]Dept: [phone] Fax: [phone]  I, LRita Ohara am acting as scribe for CDerwood Kaplan MD  I have reviewed this report as typed by the medical scribe, and it is complete and accurate.  LHermina Barters

## 2020-04-21 ENCOUNTER — Encounter: Payer: Self-pay | Admitting: Oncology

## 2020-04-21 ENCOUNTER — Other Ambulatory Visit: Payer: Self-pay

## 2020-04-21 ENCOUNTER — Other Ambulatory Visit: Payer: Self-pay | Admitting: Oncology

## 2020-04-21 ENCOUNTER — Inpatient Hospital Stay: Payer: PPO | Attending: Oncology | Admitting: Oncology

## 2020-04-21 ENCOUNTER — Telehealth: Payer: Self-pay | Admitting: Oncology

## 2020-04-21 VITALS — BP 164/87 | HR 89 | Temp 98.3°F | Resp 18 | Ht 61.0 in | Wt 216.5 lb

## 2020-04-21 DIAGNOSIS — Z170 Estrogen receptor positive status [ER+]: Secondary | ICD-10-CM | POA: Diagnosis not present

## 2020-04-21 DIAGNOSIS — C50212 Malignant neoplasm of upper-inner quadrant of left female breast: Secondary | ICD-10-CM

## 2020-04-21 NOTE — Telephone Encounter (Signed)
Per 3/18 los next appt scheduled and given to patient 

## 2020-05-22 DIAGNOSIS — M25561 Pain in right knee: Secondary | ICD-10-CM | POA: Diagnosis not present

## 2020-05-22 DIAGNOSIS — L0232 Furuncle of buttock: Secondary | ICD-10-CM | POA: Diagnosis not present

## 2020-06-03 DIAGNOSIS — M5030 Other cervical disc degeneration, unspecified cervical region: Secondary | ICD-10-CM | POA: Diagnosis not present

## 2020-06-03 DIAGNOSIS — I10 Essential (primary) hypertension: Secondary | ICD-10-CM | POA: Diagnosis not present

## 2020-06-03 DIAGNOSIS — M5136 Other intervertebral disc degeneration, lumbar region: Secondary | ICD-10-CM | POA: Diagnosis not present

## 2020-06-22 DIAGNOSIS — R300 Dysuria: Secondary | ICD-10-CM | POA: Diagnosis not present

## 2020-06-22 DIAGNOSIS — K219 Gastro-esophageal reflux disease without esophagitis: Secondary | ICD-10-CM | POA: Diagnosis not present

## 2020-06-22 DIAGNOSIS — R059 Cough, unspecified: Secondary | ICD-10-CM | POA: Diagnosis not present

## 2020-07-04 DIAGNOSIS — I10 Essential (primary) hypertension: Secondary | ICD-10-CM | POA: Diagnosis not present

## 2020-07-04 DIAGNOSIS — M5030 Other cervical disc degeneration, unspecified cervical region: Secondary | ICD-10-CM | POA: Diagnosis not present

## 2020-07-04 DIAGNOSIS — M5136 Other intervertebral disc degeneration, lumbar region: Secondary | ICD-10-CM | POA: Diagnosis not present

## 2020-07-20 DIAGNOSIS — M25461 Effusion, right knee: Secondary | ICD-10-CM | POA: Diagnosis not present

## 2020-07-20 DIAGNOSIS — M1711 Unilateral primary osteoarthritis, right knee: Secondary | ICD-10-CM | POA: Diagnosis not present

## 2020-07-20 DIAGNOSIS — M25561 Pain in right knee: Secondary | ICD-10-CM | POA: Diagnosis not present

## 2020-08-03 DIAGNOSIS — I10 Essential (primary) hypertension: Secondary | ICD-10-CM | POA: Diagnosis not present

## 2020-08-03 DIAGNOSIS — M5136 Other intervertebral disc degeneration, lumbar region: Secondary | ICD-10-CM | POA: Diagnosis not present

## 2020-08-03 DIAGNOSIS — M5030 Other cervical disc degeneration, unspecified cervical region: Secondary | ICD-10-CM | POA: Diagnosis not present

## 2020-08-09 ENCOUNTER — Ambulatory Visit: Payer: PPO | Admitting: Allergy and Immunology

## 2020-08-09 ENCOUNTER — Other Ambulatory Visit: Payer: Self-pay

## 2020-08-09 ENCOUNTER — Encounter: Payer: Self-pay | Admitting: Allergy and Immunology

## 2020-08-09 VITALS — BP 140/86 | HR 76 | Resp 16

## 2020-08-09 DIAGNOSIS — B370 Candidal stomatitis: Secondary | ICD-10-CM

## 2020-08-09 DIAGNOSIS — J4489 Other specified chronic obstructive pulmonary disease: Secondary | ICD-10-CM

## 2020-08-09 DIAGNOSIS — J3089 Other allergic rhinitis: Secondary | ICD-10-CM

## 2020-08-09 DIAGNOSIS — J449 Chronic obstructive pulmonary disease, unspecified: Secondary | ICD-10-CM

## 2020-08-09 DIAGNOSIS — K219 Gastro-esophageal reflux disease without esophagitis: Secondary | ICD-10-CM | POA: Diagnosis not present

## 2020-08-09 NOTE — Patient Instructions (Addendum)
  1. Continue to Treat inflammation:   A. Asmanex 220 - 1-2 inhalations 1-2 times per day (MAX=4 inhalations)  B. Nasacort - 1 spray each nostril 1-7 times per week  2. Continue to Treat reflux:   A. Dexilant 60 mg 3-7 times per week  3.  Continue to treat intermittent thrush with the following:   A.  Nystatin oral solution-5 mL after Asmanex - rinse and spit  4. Continue ProAir HFA and Allegra if needed  5. Return to clinic in 6 months or earlier if problem.    6. Monoclonal antibody infusion or Paxlovid for Covid if infected

## 2020-08-09 NOTE — Progress Notes (Signed)
Autumn Carter   Follow-up Note  Referring Provider: Ernestene Kiel, MD Primary Provider: Ernestene Kiel, MD Date of Office Visit: 08/09/2020  Subjective:   Autumn Carter (DOB: 1955/08/09) is a 65 y.o. female who returns to the [facility] on 08/09/2020 in re-evaluation of the following:  HPI: Autumn Carter returns to this clinic in reevaluation of asthma and allergic rhinitis and reflux.  I last saw her in this clinic on 09 February 2020.  Overall she thinks that she is doing relatively well with her asthma.  It does not sound as though she has required a systemic steroid to treat an exacerbation.  She rarely uses a short acting bronchodilator.  She does not really exercise to any significant extent because of some musculoskeletal issues.  She continues to use Asmanex at relatively low dose at this point in time.  She apparently had a side effect from an off brand Rhinocort.  When she started using a nasal spray she developed nausea and vomiting.  She has since discontinued any type of nasal sprays.  She has developed some nasal congestion and some drainage in her throat.  It does not sound as though she has required an antibiotic to treat an episode of sinusitis.  Her reflux appears to be under pretty good control at this point in time and she has been able to taper her Dexilant to 1 time per day.  She occasionally still gets some problems with thrush especially involving her tongue for which she will use nystatin.  Her use of nystatin is minimal usually with a Q-tip application on her tongue.  Allergies as of 08/09/2020       Reactions   Ace Inhibitors Swelling   Amlodipine Other (See Comments)   Abdominal pain.   Amoxicillin    Arnuity Ellipta [fluticasone Propionate (inhal)] Other (See Comments)   Eye issues   Benicar Hct [olmesartan Medoxomil-hctz] Swelling   Tongue and Lip swelling   Beta Adrenergic Blockers    Eye  Infections.    Cephalexin    Cephalosporins Other (See Comments)   Unknown   Clindamycin/lincomycin    Clotrimazole    Regurgitation   Codeine Sulfate    Covid-19 Ad26 Vaccine(janssen)    Doxazosin Other (See Comments)   pain   Flovent Hfa [fluticasone]    Heartburn   Hydralazine Hcl    Hydrochlorothiazide    Kidney swelling   Lansoprazole    Levaquin [levofloxacin In D5w]    Macrobid [nitrofurantoin Monohyd Macro]    Macrobid [nitrofurantoin]    Cough   Metoprolol Tartrate    Morphine And Related Other (See Comments)   hallucinations     Moxifloxacin Hcl    Pain in arms and right wrist   Naltrexone Nausea Only   Nasonex [mometasone Furoate]    Nifedipine Other (See Comments)   Headache   Olmesartan    Olmesartan Medoxomil: trembling, stomach upset   Prednisone    High dose = Increase in GERD.   Propoxyphene Other (See Comments)   Unknown   Pulmicort [budesonide]    Body ached all over---took over the counter:  Equate nasal spray form of this medication per patient '   Qvar [beclomethasone] Nausea Only, Other (See Comments)   Runny nose   Ranitidine Itching, Swelling   Silvadene [silver Sulfadiazine]    Didn't feel well   Singulair [montelukast Sodium] Other (See Comments)   heartburn   Spironolactone Other (  See Comments)   Worsened GERD   Sulfa Antibiotics    Tobramycin    Eye redness, cheek redness, breast mucus leakage   Voltaren [diclofenac] Swelling, Other (See Comments)   Asthma   Zestril [lisinopril]    Zofran [ondansetron]    Constipation        Medication List    ALIGN PO Take 1 tablet by mouth daily.   Asmanex (60 Metered Doses) 220 MCG/INH inhaler Generic drug: mometasone INHALE 2 PUFFS IN THE EVENING ONCE A DAY   calcium citrate-vitamin D 315-200 MG-UNIT tablet Commonly known as: CITRACAL+D Take 1 tablet by mouth daily.   cloNIDine 0.2 mg/24hr patch Commonly known as: CATAPRES - Dosed in mg/24 hr APPLY 1 PATCH TO SKIN ONCE PER  WEEK TRANSDERMAL 90 DAYS   dexlansoprazole 60 MG capsule Commonly known as: DEXILANT Take 60 mg by mouth daily.   diazepam 5 MG tablet Commonly known as: VALIUM Take 2.5-5 mg by mouth 2 (two) times daily as needed for anxiety (sleep).   levalbuterol 1.25 MG/3ML nebulizer solution Commonly known as: XOPENEX INHALE 3MLS EVERY 8 HOURS   montelukast 10 MG tablet Commonly known as: SINGULAIR Take 10 mg by mouth at bedtime.   nystatin 100000 UNIT/ML suspension Commonly known as: MYCOSTATIN Swish and swallow 16mL three times daily as directed   ondansetron 4 MG disintegrating tablet Commonly known as: ZOFRAN-ODT Take 4 mg by mouth 3 (three) times daily as needed for nausea or vomiting.   PROAIR HFA IN Inhale into the lungs as needed.   tiZANidine 2 MG tablet Commonly known as: ZANAFLEX Take 2 mg by mouth every 8 (eight) hours as needed for muscle spasms.   traMADol 50 MG tablet Commonly known as: ULTRAM TAKE 1 TABLET BY MOUTH EVERY 6 HOURS AS NEEDED FOR 5 DAYS   valsartan 320 MG tablet Commonly known as: DIOVAN Take 320 mg by mouth daily.   Vitamin D (Cholecalciferol) 25 MCG (1000 UT) Tabs Take by mouth daily.        Past Medical History:  Diagnosis Date   Allergic rhinitis    Anxiety    Asthma    Asthma    Dry eye    Family history of colon cancer    Family history of colonic polyps    Fatty liver    Gastric polyp    GERD (gastroesophageal reflux disease)    Gout    History of breast cancer    History of colon polyps    History of kidney stones    History of shingles    IBS (irritable bowel syndrome)    with constipation and diarrhea   Internal hemorrhage    Migraine    Pneumonia    PUD (peptic ulcer disease)    Rosacea    UTI (urinary tract infection)     Past Surgical History:  Procedure Laterality Date   ABDOMINAL HYSTERECTOMY     BREAST LUMPECTOMY Left 01/30/2009   And radiotherapy   CATARACT EXTRACTION     CHOLECYSTECTOMY     COLONOSCOPY   12/01/2015   Diverticulosis of colon. Internal hemorrhoids.   ESOPHAGOGASTRODUODENOSCOPY  05/10/2013   Small hiatal hernia. Incidental gastric polyps (status post polypectomy x3). Mild gastritis   KNEE SURGERY Right    s/p arthroscopic surgery   LAPAROSCOPIC HYSTERECTOMY  12/04/2009   Complete with bladder tack   NOSE SURGERY  1976    Review of systems negative except as noted in HPI / PMHx or noted below:  Review  of Systems  Constitutional: Negative.   HENT: Negative.    Eyes: Negative.   Respiratory: Negative.    Cardiovascular: Negative.   Gastrointestinal: Negative.   Genitourinary: Negative.   Musculoskeletal: Negative.   Skin: Negative.   Neurological: Negative.   Endo/Heme/Allergies: Negative.   Psychiatric/Behavioral: Negative.      Objective:   Vitals:   08/09/20 0909  BP: 140/86  Pulse: 76  Resp: 16  SpO2: 97%          Physical Exam Constitutional:      Appearance: She is not diaphoretic.  HENT:     Head: Normocephalic.     Right Ear: Tympanic membrane, ear canal and external ear normal.     Left Ear: Tympanic membrane, ear canal and external ear normal.     Nose: Nose normal. No mucosal edema or rhinorrhea.     Mouth/Throat:     Pharynx: Uvula midline. No oropharyngeal exudate.  Eyes:     Conjunctiva/sclera: Conjunctivae normal.  Neck:     Thyroid: No thyromegaly.     Trachea: Trachea normal. No tracheal tenderness or tracheal deviation.  Cardiovascular:     Rate and Rhythm: Normal rate and regular rhythm.     Heart sounds: Normal heart sounds, S1 normal and S2 normal. No murmur heard. Pulmonary:     Effort: No respiratory distress.     Breath sounds: Normal breath sounds. No stridor. No wheezing or rales.  Lymphadenopathy:     Head:     Right side of head: No tonsillar adenopathy.     Left side of head: No tonsillar adenopathy.     Cervical: No cervical adenopathy.  Skin:    Findings: No erythema or rash.     Nails: There is no  clubbing.  Neurological:     Mental Status: She is alert.    Diagnostics:    Spirometry was performed and demonstrated an FEV1 of 1.05 at 49 % of predicted.  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 Other allergic rhinitis   3. LPRD (laryngopharyngeal reflux disease)   4. Thrush     1. Continue to Treat inflammation:   A. Asmanex 220 - 1-2 inhalations 1-2 times per day (MAX=4 inhalations)  B. Nasacort - 1 spray each nostril 1-7 times per week  2. Continue to Treat reflux:   A. Dexilant 60 mg 3-7 times per week  3.  Continue to treat intermittent thrush with the following:   A.  Nystatin oral solution-5 mL after Asmanex - rinse and spit  4. Continue ProAir HFA and Allegra if needed  5. Return to clinic in 6 months or earlier if problem.    6. Monoclonal antibody infusion or Paxlovid for Covid if infected  Autumn Carter appears to be doing relatively well at this point in time on her current therapy.  She has a component of asthma accompanied by a fixed physiologic deficit with probably some degree of fibrotic lung disease whether that be from radiation exposure for his breast cancer or some other process which appears to be stable.  We will keep her on a collection of anti-inflammatory agents for her airway as noted above and she can continue to address her reflux issue with treatment noted above.  I will see her back in this clinic in 6 months or earlier if there is a problem.   Allena Katz, MD Allergy / Immunology [place]

## 2020-08-10 ENCOUNTER — Encounter: Payer: Self-pay | Admitting: Allergy and Immunology

## 2020-08-29 DIAGNOSIS — Z9889 Other specified postprocedural states: Secondary | ICD-10-CM | POA: Diagnosis not present

## 2020-08-29 DIAGNOSIS — M1711 Unilateral primary osteoarthritis, right knee: Secondary | ICD-10-CM | POA: Diagnosis not present

## 2020-09-03 DIAGNOSIS — M5030 Other cervical disc degeneration, unspecified cervical region: Secondary | ICD-10-CM | POA: Diagnosis not present

## 2020-09-03 DIAGNOSIS — I10 Essential (primary) hypertension: Secondary | ICD-10-CM | POA: Diagnosis not present

## 2020-09-03 DIAGNOSIS — M5136 Other intervertebral disc degeneration, lumbar region: Secondary | ICD-10-CM | POA: Diagnosis not present

## 2020-09-14 DIAGNOSIS — M25569 Pain in unspecified knee: Secondary | ICD-10-CM | POA: Diagnosis not present

## 2020-09-14 DIAGNOSIS — K219 Gastro-esophageal reflux disease without esophagitis: Secondary | ICD-10-CM | POA: Diagnosis not present

## 2020-09-14 DIAGNOSIS — I10 Essential (primary) hypertension: Secondary | ICD-10-CM | POA: Diagnosis not present

## 2020-10-04 DIAGNOSIS — M5136 Other intervertebral disc degeneration, lumbar region: Secondary | ICD-10-CM | POA: Diagnosis not present

## 2020-10-04 DIAGNOSIS — M5030 Other cervical disc degeneration, unspecified cervical region: Secondary | ICD-10-CM | POA: Diagnosis not present

## 2020-10-04 DIAGNOSIS — I10 Essential (primary) hypertension: Secondary | ICD-10-CM | POA: Diagnosis not present

## 2020-10-18 DIAGNOSIS — Z79899 Other long term (current) drug therapy: Secondary | ICD-10-CM | POA: Diagnosis not present

## 2020-10-18 DIAGNOSIS — R7301 Impaired fasting glucose: Secondary | ICD-10-CM | POA: Diagnosis not present

## 2020-10-18 DIAGNOSIS — M109 Gout, unspecified: Secondary | ICD-10-CM | POA: Diagnosis not present

## 2020-10-18 DIAGNOSIS — I10 Essential (primary) hypertension: Secondary | ICD-10-CM | POA: Diagnosis not present

## 2020-11-02 DIAGNOSIS — M545 Low back pain, unspecified: Secondary | ICD-10-CM | POA: Diagnosis not present

## 2020-11-02 DIAGNOSIS — M546 Pain in thoracic spine: Secondary | ICD-10-CM | POA: Diagnosis not present

## 2020-11-02 DIAGNOSIS — M542 Cervicalgia: Secondary | ICD-10-CM | POA: Diagnosis not present

## 2020-11-02 DIAGNOSIS — Z6838 Body mass index (BMI) 38.0-38.9, adult: Secondary | ICD-10-CM | POA: Diagnosis not present

## 2020-11-02 DIAGNOSIS — M549 Dorsalgia, unspecified: Secondary | ICD-10-CM | POA: Diagnosis not present

## 2020-11-02 DIAGNOSIS — E1169 Type 2 diabetes mellitus with other specified complication: Secondary | ICD-10-CM | POA: Diagnosis not present

## 2020-11-02 DIAGNOSIS — M109 Gout, unspecified: Secondary | ICD-10-CM | POA: Diagnosis not present

## 2020-11-06 DIAGNOSIS — M9904 Segmental and somatic dysfunction of sacral region: Secondary | ICD-10-CM | POA: Diagnosis not present

## 2020-11-06 DIAGNOSIS — M47815 Spondylosis without myelopathy or radiculopathy, thoracolumbar region: Secondary | ICD-10-CM | POA: Diagnosis not present

## 2020-11-06 DIAGNOSIS — M4726 Other spondylosis with radiculopathy, lumbar region: Secondary | ICD-10-CM | POA: Diagnosis not present

## 2020-11-06 DIAGNOSIS — M4728 Other spondylosis with radiculopathy, sacral and sacrococcygeal region: Secondary | ICD-10-CM | POA: Diagnosis not present

## 2020-11-06 DIAGNOSIS — M9903 Segmental and somatic dysfunction of lumbar region: Secondary | ICD-10-CM | POA: Diagnosis not present

## 2020-11-06 DIAGNOSIS — M9902 Segmental and somatic dysfunction of thoracic region: Secondary | ICD-10-CM | POA: Diagnosis not present

## 2020-11-07 IMAGING — MR MR CERVICAL SPINE W/O CM
4 of 5 series · 27 of 48 positions shown · non-contrast
Comparison: None.

CLINICAL DATA: Cervicalgia with disc degeneration.

EXAM:
MRI CERVICAL SPINE WITHOUT CONTRAST
TECHNIQUE: Multiplanar, multisequence MR imaging of the cervical spine was
performed. No intravenous contrast was administered.

[Series 7: T2 · sagittal · 3.0mm · 0.55mm/px · 6 of 15 slices shown (1 of 2)]
[im 1/15]
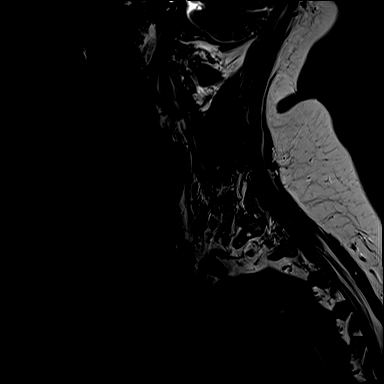
[im 3/15]
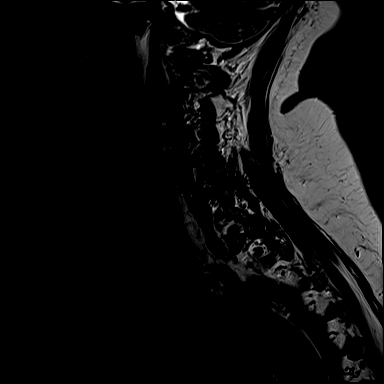
[im 6/15]
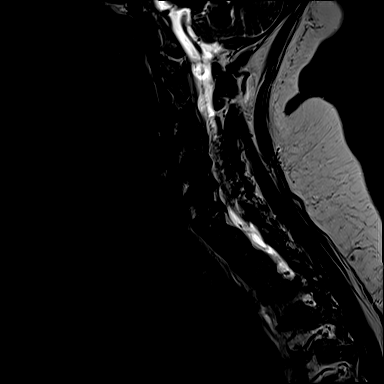
[im 9/15]
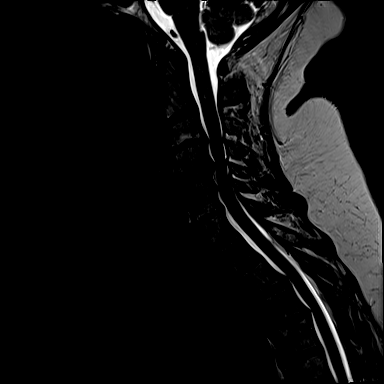
[im 12/15]
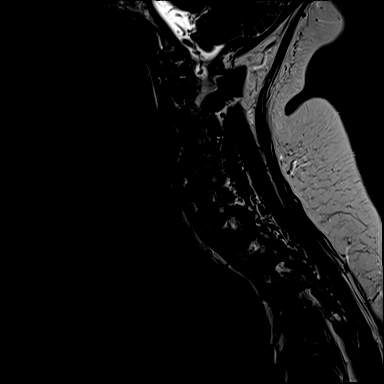
[im 15/15]
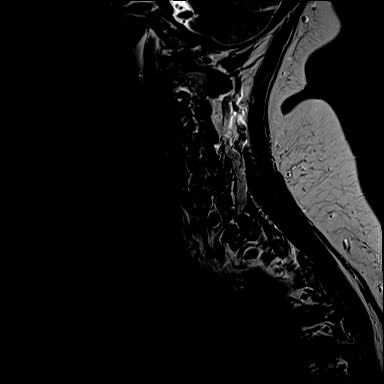

[Series 8: STIR · sagittal · 3.0mm · 0.33mm/px · 6 of 15 slices shown]
[im 1/15]
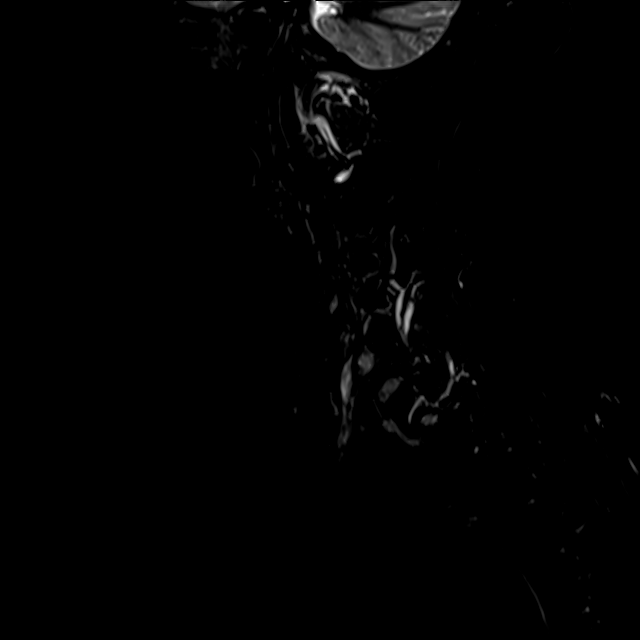
[im 3/15]
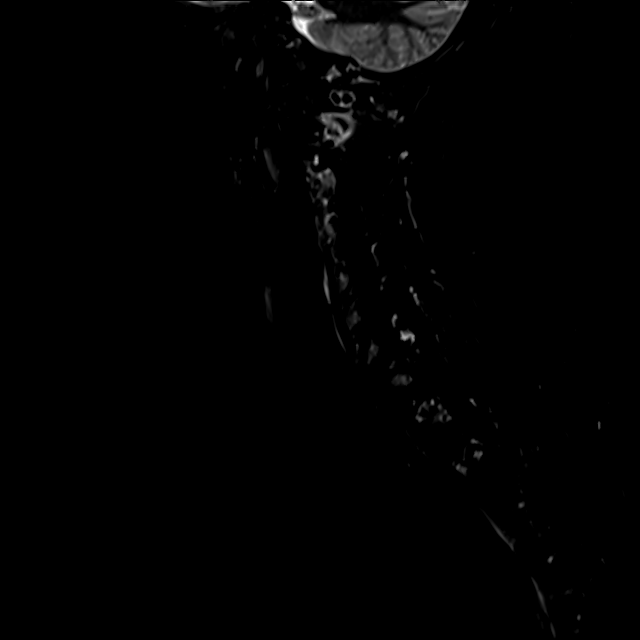
[im 5/15]
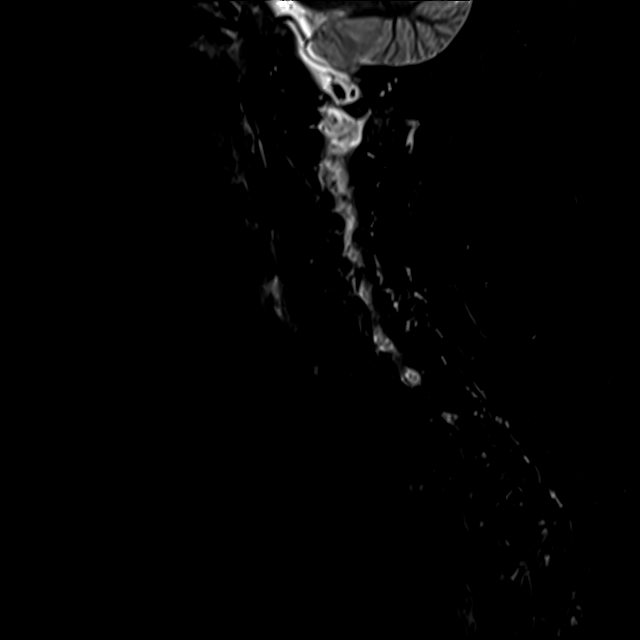
[im 8/15]
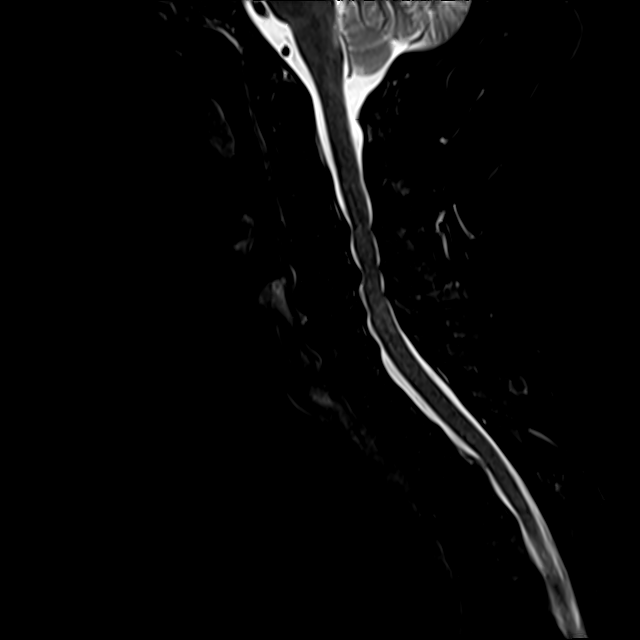
[im 10/15]
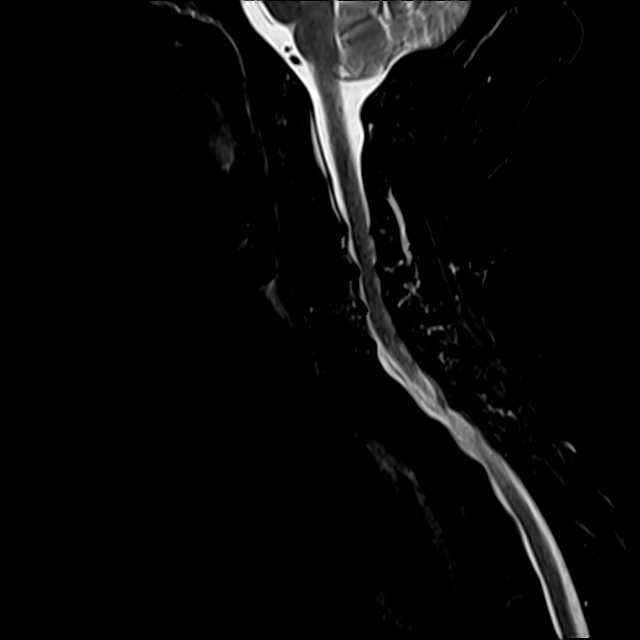
[im 12/15]
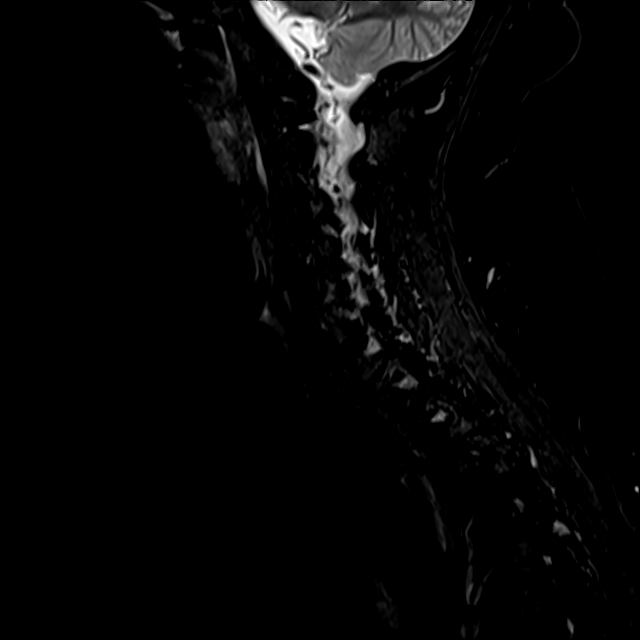

[Series 9: T2 · axial · 3.0mm · 0.50mm/px · z∈[-61,+28]mm · 8 of 30 slices shown (2 of 2)]
[im 1/30]
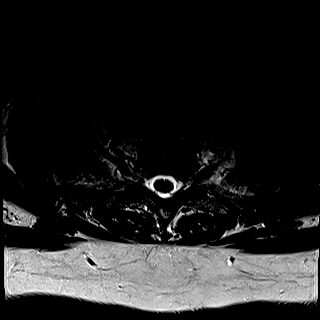
[im 5/30]
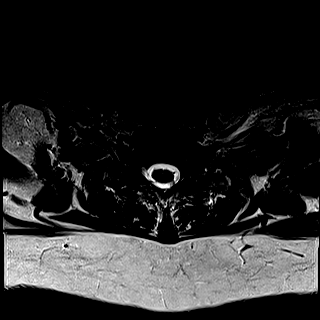
[im 9/30]
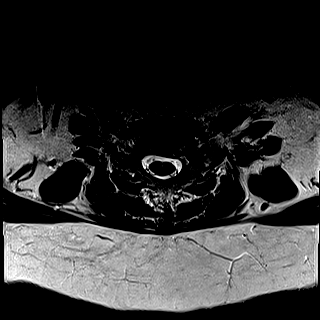
[im 14/30]
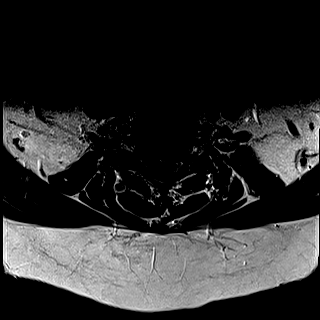
[im 16/30]
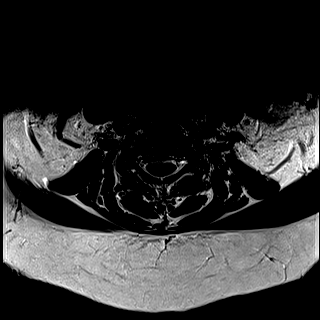
[im 21/30]
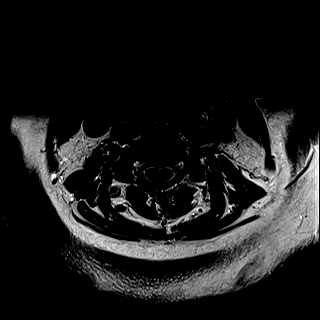
[im 25/30]
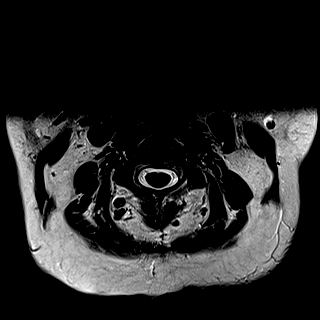
[im 30/30]
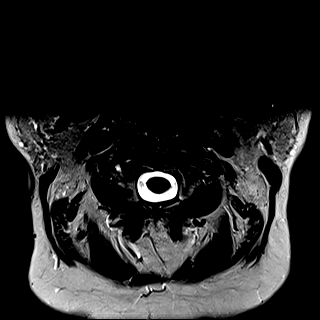

[Series 11: T1 · sagittal · 3.0mm · 0.66mm/px · 7 of 15 slices shown]
[im 1/15]
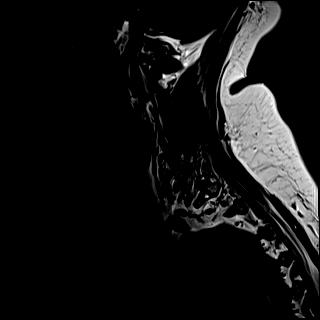
[im 3/15]
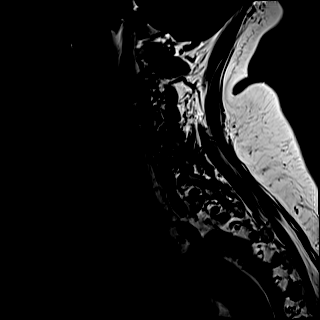
[im 5/15]
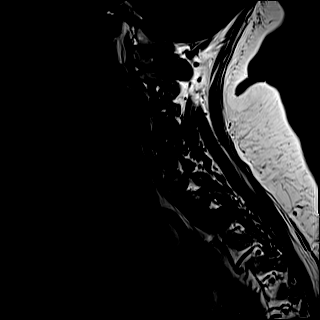
[im 8/15]
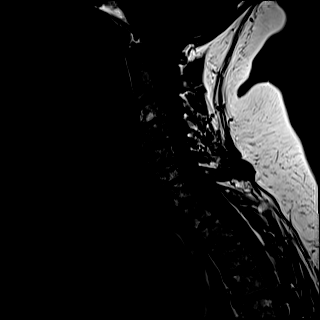
[im 10/15]
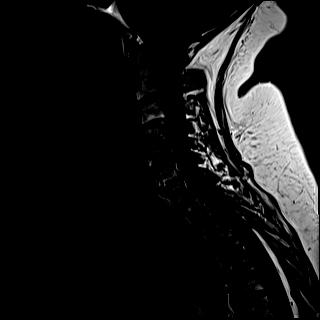
[im 12/15]
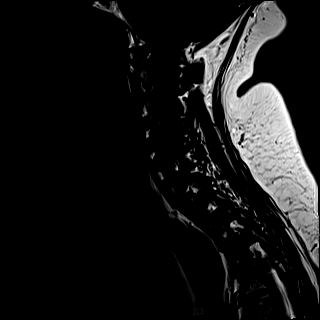
[im 15/15]
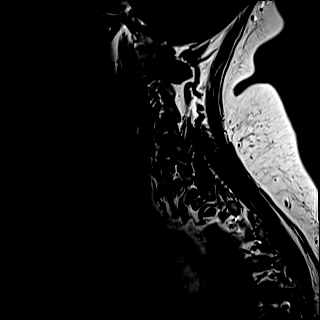

[27 of 48 positions shown; findings below may reference images not displayed]

FINDINGS: Alignment: Slight T1-2 anterolisthesis. Straightening of the
cervical spine

Vertebrae: No fracture, evidence of discitis, or bone lesion.

Cord: Normal signal and morphology.

Posterior Fossa, vertebral arteries, paraspinal tissues: Negative.

Disc levels:

C2-3: Shallow central protrusion

C3-4: Advanced disc narrowing with endplate and uncovertebral
ridging. Mild left foraminal narrowing based on axial T2 weighted
imaging. Disc contacts the ventral cord with slight flattening.

C4-5: Disc narrowing and bulging with bilateral uncovertebral
ridging. Shallow central protrusion. Mild bilateral foraminal
stenosis based on gradient imaging. Spinal stenosis with cord
flattening

C5-6: Disc narrowing with endplate and uncovertebral ridging and
disc bulge. Ligamentum flavum thickening. Spinal stenosis with mild
cord flattening. Mild right and moderate to advanced left foraminal
stenosis.

C6-7: Disc narrowing and bulging with ridging. Patent canal and
foramina

C7-T1:Mild facet spurring.  No impingement.
IMPRESSION: 1. Disc degeneration with canal stenosis causing cord flattening
greatest at C4-5.
2. C5-6 moderate to advanced left foraminal stenosis. Mild foraminal
narrowing at the other levels as described above.

## 2020-11-13 DIAGNOSIS — M47815 Spondylosis without myelopathy or radiculopathy, thoracolumbar region: Secondary | ICD-10-CM | POA: Diagnosis not present

## 2020-11-13 DIAGNOSIS — M9902 Segmental and somatic dysfunction of thoracic region: Secondary | ICD-10-CM | POA: Diagnosis not present

## 2020-11-13 DIAGNOSIS — M4726 Other spondylosis with radiculopathy, lumbar region: Secondary | ICD-10-CM | POA: Diagnosis not present

## 2020-11-13 DIAGNOSIS — M9904 Segmental and somatic dysfunction of sacral region: Secondary | ICD-10-CM | POA: Diagnosis not present

## 2020-11-13 DIAGNOSIS — M9903 Segmental and somatic dysfunction of lumbar region: Secondary | ICD-10-CM | POA: Diagnosis not present

## 2020-11-13 DIAGNOSIS — M4728 Other spondylosis with radiculopathy, sacral and sacrococcygeal region: Secondary | ICD-10-CM | POA: Diagnosis not present

## 2020-12-04 DIAGNOSIS — M9903 Segmental and somatic dysfunction of lumbar region: Secondary | ICD-10-CM | POA: Diagnosis not present

## 2020-12-04 DIAGNOSIS — M4726 Other spondylosis with radiculopathy, lumbar region: Secondary | ICD-10-CM | POA: Diagnosis not present

## 2020-12-04 DIAGNOSIS — M47815 Spondylosis without myelopathy or radiculopathy, thoracolumbar region: Secondary | ICD-10-CM | POA: Diagnosis not present

## 2020-12-04 DIAGNOSIS — M9902 Segmental and somatic dysfunction of thoracic region: Secondary | ICD-10-CM | POA: Diagnosis not present

## 2020-12-04 DIAGNOSIS — M9904 Segmental and somatic dysfunction of sacral region: Secondary | ICD-10-CM | POA: Diagnosis not present

## 2020-12-04 DIAGNOSIS — M4728 Other spondylosis with radiculopathy, sacral and sacrococcygeal region: Secondary | ICD-10-CM | POA: Diagnosis not present

## 2020-12-20 DIAGNOSIS — M9904 Segmental and somatic dysfunction of sacral region: Secondary | ICD-10-CM | POA: Diagnosis not present

## 2020-12-20 DIAGNOSIS — M4728 Other spondylosis with radiculopathy, sacral and sacrococcygeal region: Secondary | ICD-10-CM | POA: Diagnosis not present

## 2020-12-20 DIAGNOSIS — M4726 Other spondylosis with radiculopathy, lumbar region: Secondary | ICD-10-CM | POA: Diagnosis not present

## 2020-12-20 DIAGNOSIS — N644 Mastodynia: Secondary | ICD-10-CM | POA: Diagnosis not present

## 2020-12-20 DIAGNOSIS — M47815 Spondylosis without myelopathy or radiculopathy, thoracolumbar region: Secondary | ICD-10-CM | POA: Diagnosis not present

## 2020-12-20 DIAGNOSIS — M9902 Segmental and somatic dysfunction of thoracic region: Secondary | ICD-10-CM | POA: Diagnosis not present

## 2020-12-20 DIAGNOSIS — J069 Acute upper respiratory infection, unspecified: Secondary | ICD-10-CM | POA: Diagnosis not present

## 2020-12-20 DIAGNOSIS — M9903 Segmental and somatic dysfunction of lumbar region: Secondary | ICD-10-CM | POA: Diagnosis not present

## 2020-12-26 DIAGNOSIS — E2839 Other primary ovarian failure: Secondary | ICD-10-CM | POA: Diagnosis not present

## 2020-12-26 DIAGNOSIS — Z1331 Encounter for screening for depression: Secondary | ICD-10-CM | POA: Diagnosis not present

## 2020-12-26 DIAGNOSIS — Z7189 Other specified counseling: Secondary | ICD-10-CM | POA: Diagnosis not present

## 2020-12-26 DIAGNOSIS — Z1339 Encounter for screening examination for other mental health and behavioral disorders: Secondary | ICD-10-CM | POA: Diagnosis not present

## 2020-12-26 DIAGNOSIS — Z0001 Encounter for general adult medical examination with abnormal findings: Secondary | ICD-10-CM | POA: Diagnosis not present

## 2021-01-03 ENCOUNTER — Telehealth: Payer: Self-pay

## 2021-01-03 DIAGNOSIS — M5030 Other cervical disc degeneration, unspecified cervical region: Secondary | ICD-10-CM | POA: Diagnosis not present

## 2021-01-03 DIAGNOSIS — I10 Essential (primary) hypertension: Secondary | ICD-10-CM | POA: Diagnosis not present

## 2021-01-03 DIAGNOSIS — M5136 Other intervertebral disc degeneration, lumbar region: Secondary | ICD-10-CM | POA: Diagnosis not present

## 2021-01-03 NOTE — Telephone Encounter (Signed)
I have spoke to patient and she will call back to schedule a visit with pre-visit for recall colonoscopy.

## 2021-01-04 ENCOUNTER — Encounter: Payer: Self-pay | Admitting: Gastroenterology

## 2021-01-11 DIAGNOSIS — M9901 Segmental and somatic dysfunction of cervical region: Secondary | ICD-10-CM | POA: Diagnosis not present

## 2021-01-11 DIAGNOSIS — M9904 Segmental and somatic dysfunction of sacral region: Secondary | ICD-10-CM | POA: Diagnosis not present

## 2021-01-11 DIAGNOSIS — M4724 Other spondylosis with radiculopathy, thoracic region: Secondary | ICD-10-CM | POA: Diagnosis not present

## 2021-01-11 DIAGNOSIS — M9902 Segmental and somatic dysfunction of thoracic region: Secondary | ICD-10-CM | POA: Diagnosis not present

## 2021-01-11 DIAGNOSIS — M47812 Spondylosis without myelopathy or radiculopathy, cervical region: Secondary | ICD-10-CM | POA: Diagnosis not present

## 2021-01-11 DIAGNOSIS — M5388 Other specified dorsopathies, sacral and sacrococcygeal region: Secondary | ICD-10-CM | POA: Diagnosis not present

## 2021-02-12 ENCOUNTER — Ambulatory Visit: Payer: PPO | Admitting: Allergy and Immunology

## 2021-02-12 ENCOUNTER — Other Ambulatory Visit: Payer: Self-pay

## 2021-02-12 ENCOUNTER — Encounter: Payer: Self-pay | Admitting: Allergy and Immunology

## 2021-02-12 VITALS — BP 140/82 | HR 102 | Resp 16 | Ht 61.5 in | Wt 199.2 lb

## 2021-02-12 DIAGNOSIS — K219 Gastro-esophageal reflux disease without esophagitis: Secondary | ICD-10-CM | POA: Diagnosis not present

## 2021-02-12 DIAGNOSIS — J3089 Other allergic rhinitis: Secondary | ICD-10-CM | POA: Diagnosis not present

## 2021-02-12 DIAGNOSIS — B370 Candidal stomatitis: Secondary | ICD-10-CM | POA: Diagnosis not present

## 2021-02-12 DIAGNOSIS — J449 Chronic obstructive pulmonary disease, unspecified: Secondary | ICD-10-CM | POA: Diagnosis not present

## 2021-02-12 MED ORDER — NYSTATIN 100000 UNIT/ML MT SUSP: OROMUCOSAL | 1 refills | Status: DC

## 2021-02-12 NOTE — Patient Instructions (Addendum)
°  1. Continue to Treat inflammation:   A. Asmanex 220 - 1-2 inhalations 1-2 times per day (MAX=4/day)  2. Continue to Treat reflux:   A. Dexilant 60 mg 3-7 times per week  3.  If needed:   A.  Nystatin oral solution-5 mL after Asmanex - rinse and spit  B.  Proair HFA  C.  Antihistamine  D.  Nasal saline  4. Return to clinic in 6 months or earlier if problem.    5. Paxlovid for Covid, Tamiflu for influenza

## 2021-02-12 NOTE — Progress Notes (Signed)
[facility]   Follow-up Note  Referring Provider: Ernestene Kiel, MD Primary Provider: Ernestene Kiel, MD Date of Office Visit: 02/12/2021  Subjective:   Autumn Carter (DOB: [birth year]) is a 66 y.o. female who returns to the [facility] on 02/12/2021 in re-evaluation of the following:  HPI: Autumn Carter returns to this clinic in evaluation of asthma/COPD overlap, allergic rhinitis, and reflux , and a history of recurrent thrush.  I last saw her in this clinic on 09 August 2020.  She has done very well with her airway.  She does not exert herself because of a foot and back issue so she does not really exercise.  She does get somewhat dyspneic if she attempts to exert herself to any significant degree.  Sometimes cold air exposure also gives her some shortness of breath.  Her requirement for short acting bronchodilator is very low while using Asmanex at a dose of 220 mcg 1 time per day at this point.  She has not required a systemic steroid to treat an exacerbation of asthma.  Her upper airway is doing well while using nasal saline.  She is no longer uses any Nasacort.  She apparently did require a steroid for a "virus" that appear to affect her ears for the most part.  Otherwise, she has not required a systemic steroid or antibiotic for any type of upper airway issue.  Her reflux is still an intermittently active issue even though she uses a proton pump inhibitor.  She still must sleep in a reclining position.  She uses her proton pump inhibitor every day to every other day.  Sometimes if she uses it every day for prolonged period in time it inflames up her feet.  She has not had to use very much nystatin with her low dose of Asmanex.  She did not receive the flu vaccine.  She has only received 1 J&J vaccine.  She has not had a symptomatic COVID infection.  Allergies as of 02/12/2021       Reactions   Ace Inhibitors Swelling    Amlodipine Other (See Comments)   Abdominal pain.   Amoxicillin    Arnuity Ellipta [fluticasone Furoate] Other (See Comments)   Eye issues   Benicar Hct [olmesartan Medoxomil-hctz] Swelling   Tongue and Lip swelling   Beta Adrenergic Blockers    Eye Infections.    Cephalexin    Cephalosporins Other (See Comments)   Unknown   Clindamycin/lincomycin    Clotrimazole    Regurgitation   Codeine Sulfate    Covid-19 Ad26 Vaccine(janssen)    Doxazosin Other (See Comments)   pain   Flovent Hfa [fluticasone]    Heartburn   Hydralazine Hcl    Hydrochlorothiazide    Kidney swelling   Lansoprazole    Levaquin [levofloxacin In D5w]    Macrobid [nitrofurantoin Monohyd Macro]    Macrobid [nitrofurantoin]    Cough   Metoprolol Tartrate    Morphine And Related Other (See Comments)   hallucinations     Moxifloxacin Hcl    Pain in arms and right wrist   Naltrexone Nausea Only   Nasonex [mometasone Furoate]    Nifedipine Other (See Comments)   Headache   Olmesartan    Olmesartan Medoxomil: trembling, stomach upset   Prednisone    High dose = Increase in GERD.   Propoxyphene Other (See Comments)   Unknown   Pulmicort [budesonide]    Body ached all  over---took over the counter:  Equate nasal spray form of this medication per patient '   Qvar [beclomethasone] Nausea Only, Other (See Comments)   Runny nose   Ranitidine Itching, Swelling   Silvadene [silver Sulfadiazine]    Didn't feel well   Singulair [montelukast Sodium] Other (See Comments)   heartburn   Spironolactone Other (See Comments)   Worsened GERD   Sulfa Antibiotics    Tobramycin    Eye redness, cheek redness, breast mucus leakage   Voltaren [diclofenac] Swelling, Other (See Comments)   Asthma   Zestril [lisinopril]    Zofran [ondansetron]    Constipation        Medication List    ALIGN PO Take 1 tablet by mouth daily.   Asmanex (60 Metered Doses) 220 MCG/ACT inhaler Generic drug: mometasone INHALE 2  PUFFS IN THE EVENING ONCE A DAY   cloNIDine 0.2 mg/24hr patch Commonly known as: CATAPRES - Dosed in mg/24 hr APPLY 1 PATCH TO SKIN ONCE PER WEEK TRANSDERMAL 90 DAYS   dexlansoprazole 60 MG capsule Commonly known as: DEXILANT Take 60 mg by mouth daily.   diazepam 5 MG tablet Commonly known as: VALIUM Take 2.5-5 mg by mouth 2 (two) times daily as needed for anxiety (sleep).   levalbuterol 1.25 MG/3ML nebulizer solution Commonly known as: XOPENEX INHALE 3MLS EVERY 8 HOURS   montelukast 10 MG tablet Commonly known as: SINGULAIR Take 10 mg by mouth at bedtime.   nystatin 100000 UNIT/ML suspension Commonly known as: MYCOSTATIN Swish and swallow 41mL daily as directed What changed: additional instructions Changed by: Autumn Carter Autumn Rosebush, MD   ondansetron 4 MG disintegrating tablet Commonly known as: ZOFRAN-ODT Take 4 mg by mouth 3 (three) times daily as needed for nausea or vomiting.   PROAIR HFA IN Inhale into the lungs as needed.   prochlorperazine 5 MG tablet Commonly known as: COMPAZINE Take 5 mg by mouth every 8 (eight) hours as needed for nausea.   tiZANidine 2 MG tablet Commonly known as: ZANAFLEX Take 2 mg by mouth every 8 (eight) hours as needed for muscle spasms.   traMADol 50 MG tablet Commonly known as: ULTRAM TAKE 1 TABLET BY MOUTH EVERY 6 HOURS AS NEEDED FOR 5 DAYS   valsartan 320 MG tablet Commonly known as: DIOVAN Take 320 mg by mouth daily.   Vitamin D (Cholecalciferol) 25 MCG (1000 UT) Tabs Take by mouth daily.   VOLTAREN EX Apply topically as needed.      Past Medical History:  Diagnosis Date   Allergic rhinitis    Anxiety    Asthma    Asthma    Dry eye    Family history of colon cancer    Family history of colonic polyps    Fatty liver    Gastric polyp    GERD (gastroesophageal reflux disease)    Gout    History of breast cancer    History of colon polyps    History of kidney stones    History of shingles    IBS (irritable bowel  syndrome)    with constipation and diarrhea   Internal hemorrhage    Migraine    Pneumonia    PUD (peptic ulcer disease)    Rosacea    UTI (urinary tract infection)     Past Surgical History:  Procedure Laterality Date   ABDOMINAL HYSTERECTOMY     BREAST LUMPECTOMY Left 01/30/2009   And radiotherapy   CATARACT EXTRACTION     CHOLECYSTECTOMY  COLONOSCOPY  12/01/2015   Diverticulosis of colon. Internal hemorrhoids.   ESOPHAGOGASTRODUODENOSCOPY  05/10/2013   Small hiatal hernia. Incidental gastric polyps (status post polypectomy x3). Mild gastritis   KNEE SURGERY Right    s/p arthroscopic surgery   LAPAROSCOPIC HYSTERECTOMY  12/04/2009   Complete with bladder tack   NOSE SURGERY  1976    Review of systems negative except as noted in HPI / PMHx or noted below:  Review of Systems  Constitutional: Negative.   HENT: Negative.    Eyes: Negative.   Respiratory: Negative.    Cardiovascular: Negative.   Gastrointestinal: Negative.   Genitourinary: Negative.   Musculoskeletal: Negative.   Skin: Negative.   Neurological: Negative.   Endo/Heme/Allergies: Negative.   Psychiatric/Behavioral: Negative.      Objective:   Vitals:   02/12/21 0952  BP: 140/82  Pulse: (!) 102  Resp: 16  SpO2: 98%   Height: 5' 1.5" (156.2 cm)  Weight: 199 lb 3.2 oz (90.4 kg)   Physical Exam Constitutional:      Appearance: She is not diaphoretic.  HENT:     Head: Normocephalic.     Right Ear: Tympanic membrane, ear canal and external ear normal.     Left Ear: Tympanic membrane, ear canal and external ear normal.     Nose: Nose normal. No mucosal edema or rhinorrhea.     Mouth/Throat:     Pharynx: Uvula midline. No oropharyngeal exudate.  Eyes:     Conjunctiva/sclera: Conjunctivae normal.  Neck:     Thyroid: No thyromegaly.     Trachea: Trachea normal. No tracheal tenderness or tracheal deviation.  Cardiovascular:     Rate and Rhythm: Normal rate and regular rhythm.     Heart  sounds: Normal heart sounds, S1 normal and S2 normal. No murmur heard. Pulmonary:     Effort: No respiratory distress.     Breath sounds: Normal breath sounds. No stridor. No wheezing or rales.  Lymphadenopathy:     Head:     Right side of head: No tonsillar adenopathy.     Left side of head: No tonsillar adenopathy.     Cervical: No cervical adenopathy.  Skin:    Findings: No erythema or rash.     Nails: There is no clubbing.  Neurological:     Mental Status: She is alert.    Diagnostics:    Spirometry was performed and demonstrated an FEV1 of 1.06 at 50 % of predicted.  Assessment and Plan:   1. COPD with asthma ([place])   2. Other allergic rhinitis   3. LPRD (laryngopharyngeal reflux disease)   4. Thrush     1. Continue to Treat inflammation:   A. Asmanex 220 - 1-2 inhalations 1-2 times per day (MAX=4/day)  2. Continue to Treat reflux:   A. Dexilant 60 mg 3-7 times per week  3.  If needed:   A.  Nystatin oral solution-5 mL after Asmanex - rinse and spit  B.  Proair HFA  C.  Antihistamine  D.  Nasal saline  4. Return to clinic in 6 months or earlier if problem.    5. Paxlovid for Covid, Tamiflu for influenza  Overall Autumn Carter appears to be doing pretty well.  I do not really see any need for her to change her medical therapy at this point.  She will use a specific dose of Asmanex depending on disease activity.  She will continue to use a proton pump inhibitor to address her reflux.  There are  several medications that she can use should they be required as noted above.  Assuming she does well I will see her back in this clinic in 6 months or earlier if there is a problem.   Allena Katz, MD [facility] / [facility] [place]

## 2021-02-13 ENCOUNTER — Encounter: Payer: Self-pay | Admitting: Allergy and Immunology

## 2021-02-14 DIAGNOSIS — M9902 Segmental and somatic dysfunction of thoracic region: Secondary | ICD-10-CM | POA: Diagnosis not present

## 2021-02-14 DIAGNOSIS — M4722 Other spondylosis with radiculopathy, cervical region: Secondary | ICD-10-CM | POA: Diagnosis not present

## 2021-02-14 DIAGNOSIS — M9903 Segmental and somatic dysfunction of lumbar region: Secondary | ICD-10-CM | POA: Diagnosis not present

## 2021-02-14 DIAGNOSIS — M4723 Other spondylosis with radiculopathy, cervicothoracic region: Secondary | ICD-10-CM | POA: Diagnosis not present

## 2021-02-14 DIAGNOSIS — M9901 Segmental and somatic dysfunction of cervical region: Secondary | ICD-10-CM | POA: Diagnosis not present

## 2021-02-14 DIAGNOSIS — M47816 Spondylosis without myelopathy or radiculopathy, lumbar region: Secondary | ICD-10-CM | POA: Diagnosis not present

## 2021-02-27 DIAGNOSIS — Z6837 Body mass index (BMI) 37.0-37.9, adult: Secondary | ICD-10-CM | POA: Diagnosis not present

## 2021-02-27 DIAGNOSIS — I10 Essential (primary) hypertension: Secondary | ICD-10-CM | POA: Diagnosis not present

## 2021-02-27 DIAGNOSIS — Z79899 Other long term (current) drug therapy: Secondary | ICD-10-CM | POA: Diagnosis not present

## 2021-02-27 DIAGNOSIS — E1169 Type 2 diabetes mellitus with other specified complication: Secondary | ICD-10-CM | POA: Diagnosis not present

## 2021-02-27 DIAGNOSIS — I700 Atherosclerosis of aorta: Secondary | ICD-10-CM | POA: Diagnosis not present

## 2021-02-27 DIAGNOSIS — K219 Gastro-esophageal reflux disease without esophagitis: Secondary | ICD-10-CM | POA: Diagnosis not present

## 2021-02-27 DIAGNOSIS — M109 Gout, unspecified: Secondary | ICD-10-CM | POA: Diagnosis not present

## 2021-02-28 DIAGNOSIS — E1169 Type 2 diabetes mellitus with other specified complication: Secondary | ICD-10-CM | POA: Diagnosis not present

## 2021-03-01 ENCOUNTER — Other Ambulatory Visit: Payer: Self-pay

## 2021-03-01 ENCOUNTER — Ambulatory Visit (AMBULATORY_SURGERY_CENTER): Payer: PPO

## 2021-03-01 VITALS — Ht 62.0 in | Wt 197.0 lb

## 2021-03-01 DIAGNOSIS — Z86010 Personal history of colonic polyps: Secondary | ICD-10-CM

## 2021-03-01 DIAGNOSIS — Z1211 Encounter for screening for malignant neoplasm of colon: Secondary | ICD-10-CM

## 2021-03-01 DIAGNOSIS — Z800 Family history of malignant neoplasm of digestive organs: Secondary | ICD-10-CM

## 2021-03-01 NOTE — Progress Notes (Signed)
No egg or soy allergy known to patient  No issues known to pt with past sedation with any surgeries or procedures Patient denies ever being told they had issues or difficulty with intubation  No FH of Malignant Hyperthermia Pt is not on diet pills Pt is not on home 02  Pt is not on blood thinners  Pt reports issues with constipation-patient reports she will eat prunes/carrot; patient advised to increase fluids,activity, fruit and veggies, take stool softener/laxative; No A fib or A flutter Pt is fully vaccinated for Covid x 2; NO PA's for preps discussed with pt in PV today  Discussed with pt there will be an out-of-pocket cost for prep and that varies from $0 to 70 + dollars - pt verbalized understanding  Due to the COVID-19 pandemic we are asking patients to follow certain guidelines in PV and the [facility]   Pt aware of COVID protocols and [facility] guidelines  PV completed over the phone. Pt verified name, DOB, address and insurance during PV today.  Pt mailed instruction packet with copy of consent form to read and not return, and instructions.  Pt encouraged to call with questions or issues.  If pt has My chart, procedure instructions sent via My Chart

## 2021-03-04 DIAGNOSIS — M5136 Other intervertebral disc degeneration, lumbar region: Secondary | ICD-10-CM | POA: Diagnosis not present

## 2021-03-04 DIAGNOSIS — M5030 Other cervical disc degeneration, unspecified cervical region: Secondary | ICD-10-CM | POA: Diagnosis not present

## 2021-03-04 DIAGNOSIS — I10 Essential (primary) hypertension: Secondary | ICD-10-CM | POA: Diagnosis not present

## 2021-03-06 DIAGNOSIS — L578 Other skin changes due to chronic exposure to nonionizing radiation: Secondary | ICD-10-CM | POA: Diagnosis not present

## 2021-03-06 DIAGNOSIS — L814 Other melanin hyperpigmentation: Secondary | ICD-10-CM | POA: Diagnosis not present

## 2021-03-06 DIAGNOSIS — L570 Actinic keratosis: Secondary | ICD-10-CM | POA: Diagnosis not present

## 2021-03-06 DIAGNOSIS — C44629 Squamous cell carcinoma of skin of left upper limb, including shoulder: Secondary | ICD-10-CM | POA: Diagnosis not present

## 2021-03-06 DIAGNOSIS — R233 Spontaneous ecchymoses: Secondary | ICD-10-CM | POA: Diagnosis not present

## 2021-03-13 ENCOUNTER — Encounter: Payer: Self-pay | Admitting: Gastroenterology

## 2021-03-15 ENCOUNTER — Other Ambulatory Visit: Payer: Self-pay

## 2021-03-15 ENCOUNTER — Encounter: Payer: Self-pay | Admitting: Gastroenterology

## 2021-03-15 ENCOUNTER — Ambulatory Visit (AMBULATORY_SURGERY_CENTER): Payer: PPO | Admitting: Gastroenterology

## 2021-03-15 VITALS — BP 135/69 | HR 79 | Temp 97.8°F | Resp 13 | Ht 62.0 in | Wt 197.0 lb

## 2021-03-15 DIAGNOSIS — K648 Other hemorrhoids: Secondary | ICD-10-CM | POA: Diagnosis not present

## 2021-03-15 DIAGNOSIS — Z86010 Personal history of colonic polyps: Secondary | ICD-10-CM | POA: Diagnosis not present

## 2021-03-15 DIAGNOSIS — K5730 Diverticulosis of large intestine without perforation or abscess without bleeding: Secondary | ICD-10-CM | POA: Diagnosis not present

## 2021-03-15 DIAGNOSIS — R197 Diarrhea, unspecified: Secondary | ICD-10-CM | POA: Diagnosis not present

## 2021-03-15 DIAGNOSIS — K760 Fatty (change of) liver, not elsewhere classified: Secondary | ICD-10-CM | POA: Diagnosis not present

## 2021-03-15 DIAGNOSIS — K219 Gastro-esophageal reflux disease without esophagitis: Secondary | ICD-10-CM | POA: Diagnosis not present

## 2021-03-15 DIAGNOSIS — F419 Anxiety disorder, unspecified: Secondary | ICD-10-CM | POA: Diagnosis not present

## 2021-03-15 MED ORDER — SODIUM CHLORIDE 0.9 % IV SOLN: 500.0000 mL | Freq: Once | INTRAVENOUS | Status: DC

## 2021-03-15 NOTE — Progress Notes (Signed)
Called to room to assist during endoscopic procedure.  Patient ID and intended procedure confirmed with present staff. Received instructions for my participation in the procedure from the performing physician.  

## 2021-03-15 NOTE — Op Note (Signed)
[facility] Patient Name: Autumn Carter Procedure Date: 03/15/2021 7:54 AM MRN: [id number] Endoscopist: Jackquline Denmark , MD Age: 66 Referring MD:  Date of Birth: September 11, 1955 Gender: Female Account #: [id number] Procedure:                Colonoscopy Indications:              Colon cancer screening in patient at increased                            risk: FH of polyps. H/O polyps. H/O IBS-D. Medicines:                Monitored Anesthesia Care Procedure:                Pre-Anesthesia Assessment:                           - Prior to the procedure, a History and Physical                            was performed, and patient medications and                            allergies were reviewed. The patient's tolerance of                            previous anesthesia was also reviewed. The risks                            and benefits of the procedure and the sedation                            options and risks were discussed with the patient.                            All questions were answered, and informed consent                            was obtained. Prior Anticoagulants: The patient has                            taken no previous anticoagulant or antiplatelet                            agents. ASA Grade Assessment: II - A patient with                            mild systemic disease. After reviewing the risks                            and benefits, the patient was deemed in                            satisfactory condition to undergo the procedure.  After obtaining informed consent, the colonoscope                            was passed under direct vision. Throughout the                            procedure, the patient's blood pressure, pulse, and                            oxygen saturations were monitored continuously. The                            Olympus PCF-[id number] (#[id number]) Colonoscope was                            introduced through the anus and  advanced to the 2                            cm into the ileum. The colonoscopy was performed                            without difficulty. The patient tolerated the                            procedure well. The quality of the bowel                            preparation was good. The terminal ileum, ileocecal                            valve, appendiceal orifice, and rectum were                            photographed. Scope In: 8:06:52 AM Scope Out: 8:22:28 AM Scope Withdrawal Time: 0 hours 11 minutes 15 seconds  Total Procedure Duration: 0 hours 15 minutes 36 seconds  Findings:                 The colon (entire examined portion) appeared                            normal. Biopsies for histology were taken with a                            cold forceps from the entire colon for evaluation                            of microscopic colitis.                           Few diverticula were found in the sigmoid colon.                           Non-bleeding internal hemorrhoids were found during  retroflexion. The hemorrhoids were small and Grade                            I (internal hemorrhoids that do not prolapse).                           The terminal ileum appeared normal.                           The exam was otherwise without abnormality on                            direct and retroflexion views. Complications:            No immediate complications. Estimated Blood Loss:     Estimated blood loss: none. Impression:               - Mild sigmoid diverticulosis.                           - Non-bleeding internal hemorrhoids.                           - The examined portion of the ileum was normal.                           - The examination was otherwise normal on direct                            and retroflexion views. Recommendation:           - Patient has a contact number available for                            emergencies. The signs and symptoms of  potential                            delayed complications were discussed with the                            patient. Return to normal activities tomorrow.                            Written discharge instructions were provided to the                            patient.                           - Resume previous diet.                           - Continue present medications.                           - Await pathology results.                           -  Repeat colonoscopy is not recommended due to                            current age (6 years or older) for screening                            purposes. Hence, repeat colonoscopy only if any new                            problems or change in family history.                           - The findings and recommendations were discussed                            with the patient's family. Jackquline Denmark, MD 03/15/2021 8:27:58 AM This report has been signed electronically.

## 2021-03-15 NOTE — Patient Instructions (Signed)

## 2021-03-15 NOTE — Progress Notes (Signed)
Pt's states no medical or surgical changes since previsit or office visit.   Vs cw

## 2021-03-15 NOTE — Progress Notes (Signed)
Report given to PACU, vss 

## 2021-03-15 NOTE — Progress Notes (Signed)
Chief Complaint: Abdominal pain.  Referring Provider:  Ernestene Kiel, MD      ASSESSMENT AND PLAN;   #1. LLQ pain, neg CT 07/2019.  Likely musculoskeletal. R/O other causes.  #2. Fatty liver with Nl LFTs on CT 07/2019  #3. GERD with small HH. EGD 05/2013  #4.  IBS with alternating constipation and diarrhea.   Plan: -Wt loss.  Discussed in detail as treatment for fatty liver.  Aim is to reduce 6 pounds over the next 3 months.  She would start walking, watch calorie intake, avoid sodas and fried foods. -Next colon due 11/2020 -Heating pads/Biofreeze for musculoskeletal component of pain. -Continue dexilant for now. -FU in 6 months.   HPI:    Autumn Carter is a 66 y.o. female   LUQ pain/LLQ pain after 1 week of J&J Covid vaccine with associated neuropathy, has responded to Kenalog.  Currently does feel better.  Was sharp, nonradiating.  Underwent extensive work-up including CT Abdo/pelvis with p.o. and IV contrast July 26, 2019 which was negative except for fatty liver.  She has progressively gained weight.  Denies having any nausea/vomiting.  Her abdominal pain is somewhat better.  No fever chills or night sweats currently.  She is recovering slowly but steadily now.  Has alternating diarrhea and constipation attributed to IBS.   Past GI procedures: -EGD 05/10/2013: Small HH, incidental hyperplastic/fundic gland gastric polyps, mild gastritis. Neg Bx for HP, neg SB Bx for celiac. -Colonoscopy 11/2015: Colonic diverticulosis, internal hemorrhoids.  Recommend to repeat in 5 years due to strong family history of colonic polyps (mom) -CT Abdo/pelvis July 26, 2019: Fatty liver, no acute abnormalities. S/P cholecystectomy and hysterectomy, aortic atherosclerosis.  At [facility]  Report sent for scanning.  Past Medical History:  Diagnosis Date   Allergic rhinitis    Anxiety    on meds   Arthritis    generalized   Asthma    uses inhaler/inhaler PRN   Breast cancer ([place])  2010   LEFT   Cataract 2021   bilateral sx   Chronic kidney disease    hx of kidney stones   Dry eye    Family history of colon cancer    Family history of colonic polyps    Fatty liver    Gastric polyp    GERD (gastroesophageal reflux disease)    on meds   Gout    History of breast cancer    History of colon polyps    History of kidney stones    History of shingles    Hypertension    on meds   IBS (irritable bowel syndrome)    with constipation and diarrhea   Internal hemorrhage    Migraine    Pneumonia    PUD (peptic ulcer disease)    Rosacea    Seasonal allergies    UTI (urinary tract infection)     Past Surgical History:  Procedure Laterality Date   ABDOMINAL HYSTERECTOMY     BREAST LUMPECTOMY Left 01/30/2009   And radiotherapy   CATARACT EXTRACTION     CHOLECYSTECTOMY     COLONOSCOPY  2012   Shiah Berhow-Diverticulosis of colon. Internal hemorrhoids.   ESOPHAGOGASTRODUODENOSCOPY  05/10/2013   Small hiatal hernia. Incidental gastric polyps (status post polypectomy x3). Mild gastritis   KNEE SURGERY Right    s/p arthroscopic surgery   LAPAROSCOPIC HYSTERECTOMY  12/04/2009   Complete with bladder tack   NOSE SURGERY  1976   WISDOM TOOTH EXTRACTION  Family History  Problem Relation Age of Onset   Hypertension Mother    Colon polyps Mother    Diverticulitis Mother    Colon polyps Father 10   Hypertension Father    Colon cancer Father 21   Colon polyps Sister    Hypertension Sister    Other Sister        ruptured colon- had surgery   Breast cancer Maternal Aunt    Breast cancer Paternal Aunt    Breast cancer Cousin    Esophageal cancer Neg Hx    Rectal cancer Neg Hx    Stomach cancer Neg Hx     Social History   Tobacco Use   Smoking status: Never   Smokeless tobacco: Never  Vaping Use   Vaping Use: Never used  Substance Use Topics   Alcohol use: No   Drug use: No    Current Outpatient Medications  Medication Sig Dispense Refill   ASMANEX  60 METERED DOSES 220 MCG/INH inhaler Inhale 2 puffs into the lungs at bedtime.  2   CALCIUM-MAGNESIUM-VITAMIN D PO Take 1 tablet by mouth at bedtime.     cloNIDine (CATAPRES - DOSED IN MG/24 HR) 0.2 mg/24hr patch APPLY 1 PATCH TO SKIN ONCE PER WEEK TRANSDERMAL 90 DAYS  1   dexlansoprazole (DEXILANT) 60 MG capsule Take 60 mg by mouth every other day.     diazepam (VALIUM) 5 MG tablet Take 2.5-5 mg by mouth 2 (two) times daily as needed for anxiety (sleep).      Diclofenac Sodium (VOLTAREN EX) Apply topically as needed.     montelukast (SINGULAIR) 10 MG tablet Take 10 mg by mouth at bedtime.     Nutritional Supplements (JUICE PLUS FIBRE PO) Take 3 capsules by mouth daily at 6 (six) AM.     Probiotic Product (ALIGN PO) Take 1 tablet by mouth daily.     valsartan (DIOVAN) 320 MG tablet Take 320 mg by mouth daily.     Vitamin D, Cholecalciferol, 1000 units TABS Take by mouth daily.     Albuterol Sulfate (PROAIR HFA IN) Inhale into the lungs as needed.     Blood Glucose Monitoring Suppl (ONETOUCH VERIO REFLECT) w/Device KIT CHECK BLOOD SUGAR IN VITRO ONCE A DAY     bupivacaine (MARCAINE) 0.25 % injection Inject into the skin as needed.     colchicine 0.6 MG tablet Take 0.6 mg by mouth daily.     Lancets (ONETOUCH DELICA PLUS [id number]) MISC CHECK BLOOD SUGAR ONCE A DAY     levalbuterol (XOPENEX) 1.25 MG/3ML nebulizer solution Take 1.25 mg by nebulization every 8 (eight) hours as needed.     nystatin (MYCOSTATIN) 100000 UNIT/ML suspension Swish and swallow 52m daily as directed (Patient not taking: Reported on 03/01/2021) 450 mL 1   ONETOUCH VERIO test strip daily.     tiZANidine (ZANAFLEX) 2 MG tablet Take 2 mg by mouth every 8 (eight) hours as needed for muscle spasms.     traMADol (ULTRAM) 50 MG tablet TAKE 1 TABLET BY MOUTH EVERY 6 HOURS AS NEEDED FOR 5 DAYS  0   triamcinolone acetonide (TRIESENCE) 40 MG/ML SUSP Inject into the articular space as needed.     Current Facility-Administered  Medications  Medication Dose Route Frequency Provider Last Rate Last Admin   0.9 %  sodium chloride infusion  500 mL Intravenous Once GJackquline Denmark MD        Allergies  Allergen Reactions   Zestril [Lisinopril] Anaphylaxis and Other (See Comments)  Heart attack symptoms   Ace Inhibitors Swelling   Amlodipine Other (See Comments)    Abdominal pain.   Amoxicillin    Arnuity Ellipta [Fluticasone Furoate] Other (See Comments)    Eye issues   Beta Adrenergic Blockers     Eye Infections.    Cephalexin    Cephalosporins Other (See Comments)    Unknown   Clindamycin/Lincomycin    Clotrimazole     Regurgitation    Codeine Sulfate    Covid-19 Ad26 Vaccine(Janssen)    Doxazosin Other (See Comments)    pain   Flovent Hfa [Fluticasone]     Heartburn   Hydralazine Hcl    Hydrochlorothiazide     Kidney swelling   Lansoprazole    Levaquin [Levofloxacin In D5w]    Macrobid [Nitrofurantoin Monohyd Macro]    Macrobid [Nitrofurantoin]     Cough   Metoprolol Tartrate    Montelukast Sodium Other (See Comments)    heartburn Other reaction(s): GI Upset (intolerance) heartburn   Morphine And Related Other (See Comments)    hallucinations     Moxifloxacin Hcl     Pain in arms and right wrist   Naltrexone Nausea Only   Nasonex [Mometasone Furoate]    Nifedipine Other (See Comments)    Headache    Olmesartan     Olmesartan Medoxomil: trembling, stomach upset   Olmesartan Medoxomil-Hctz Swelling    Tongue and Lip swelling Other reaction(s): Other (See Comments) Tongue and Lip swelling Unknown   Prednisone     High dose = Increase in GERD.   Propoxyphene Other (See Comments)    Unknown Unknown Unknown   Pulmicort [Budesonide]     Body ached all over---took over the counter:  Equate nasal spray form of this medication per patient '   Qvar [Beclomethasone] Nausea Only and Other (See Comments)    Runny nose   Ranitidine Itching and Swelling   Silvadene [Silver Sulfadiazine]      Didn't feel well   Spironolactone Other (See Comments)    Worsened GERD   Sulfa Antibiotics    Tobramycin     Eye redness, cheek redness, breast mucus leakage Other reaction(s): Flushing (ALLERGY/intolerance) Eye redness, cheek redness, breast mucus leakage   Voltaren [Diclofenac] Swelling and Other (See Comments)    Asthma   Zofran [Ondansetron]     Constipation    Review of Systems:  Constitutional: Denies fever, chills, diaphoresis, appetite change and fatigue.  HEENT: Has allergies.   Respiratory: Denies SOB, DOE, has cough, No chest tightness,  and wheezing.   Cardiovascular: Denies chest pain, palpitations and leg swelling.  Genitourinary: Has urine leakage during coughing. Musculoskeletal: Denies myalgias, has back pain, joint swelling, arthralgias and gait problem.  Skin: No rash.  Neurological: Denies dizziness, seizures, syncope, weakness, light-headedness, numbness and has headaches.  Hematological: Denies adenopathy. Easy bruising, personal or family bleeding history  Psychiatric/Behavioral: Has anxiety or depression     Physical Exam:    BP (!) 159/110    Pulse (!) 107    Temp 97.8 F (36.6 C) (Temporal)    Ht 5' 2"  (1.575 m)    Wt 197 lb (89.4 kg)    SpO2 98%    BMI 36.03 kg/m  Wt Readings from Last 3 Encounters:  03/15/21 197 lb (89.4 kg)  03/01/21 197 lb (89.4 kg)  02/12/21 199 lb 3.2 oz (90.4 kg)   Constitutional:  Well-developed, in no acute distress. Psychiatric: Normal mood and affect. Behavior is normal. HEENT: Pupils normal.  Conjunctivae are normal. No scleral icterus. Cardiovascular: Normal rate, regular rhythm. No edema Pulmonary/chest: Effort normal and breath sounds normal. No wheezing, rales or rhonchi. Abdominal: Soft, nondistended. Nontender. Bowel sounds active throughout. There are no masses palpable. No hepatomegaly. Rectal:  defered Neurological: Alert and oriented to person place and time. Skin: Skin is warm and dry. No rashes  noted.  Data Reviewed: I have personally reviewed following labs and imaging studies     Carmell Austria, MD 03/15/2021, 7:59 AM  Cc: Ernestene Kiel, MD

## 2021-03-19 ENCOUNTER — Telehealth: Payer: Self-pay

## 2021-03-19 DIAGNOSIS — M4722 Other spondylosis with radiculopathy, cervical region: Secondary | ICD-10-CM | POA: Diagnosis not present

## 2021-03-19 DIAGNOSIS — M9902 Segmental and somatic dysfunction of thoracic region: Secondary | ICD-10-CM | POA: Diagnosis not present

## 2021-03-19 DIAGNOSIS — M9903 Segmental and somatic dysfunction of lumbar region: Secondary | ICD-10-CM | POA: Diagnosis not present

## 2021-03-19 DIAGNOSIS — M4723 Other spondylosis with radiculopathy, cervicothoracic region: Secondary | ICD-10-CM | POA: Diagnosis not present

## 2021-03-19 DIAGNOSIS — M9901 Segmental and somatic dysfunction of cervical region: Secondary | ICD-10-CM | POA: Diagnosis not present

## 2021-03-19 DIAGNOSIS — M47816 Spondylosis without myelopathy or radiculopathy, lumbar region: Secondary | ICD-10-CM | POA: Diagnosis not present

## 2021-03-19 NOTE — Telephone Encounter (Signed)
°  Follow up Call-  Call back number 03/15/2021  Post procedure Call Back phone  # [phone]  Permission to leave phone message Yes  Some recent data might be hidden     Patient questions:  Do you have a fever, pain , or abdominal swelling? No. Pain Score  0 *  Have you tolerated food without any problems? Yes.    Have you been able to return to your normal activities? Yes.    Do you have any questions about your discharge instructions: Diet   No. Medications  No. Follow up visit  No.  Do you have questions or concerns about your Care? No.  Actions: * If pain score is 4 or above: No action needed, pain <4.

## 2021-03-21 DIAGNOSIS — R300 Dysuria: Secondary | ICD-10-CM | POA: Diagnosis not present

## 2021-03-21 DIAGNOSIS — Z6837 Body mass index (BMI) 37.0-37.9, adult: Secondary | ICD-10-CM | POA: Diagnosis not present

## 2021-03-23 ENCOUNTER — Encounter: Payer: Self-pay | Admitting: Gastroenterology

## 2021-03-29 DIAGNOSIS — Z6837 Body mass index (BMI) 37.0-37.9, adult: Secondary | ICD-10-CM | POA: Diagnosis not present

## 2021-03-29 DIAGNOSIS — N390 Urinary tract infection, site not specified: Secondary | ICD-10-CM | POA: Diagnosis not present

## 2021-04-02 DIAGNOSIS — R059 Cough, unspecified: Secondary | ICD-10-CM | POA: Diagnosis not present

## 2021-04-02 DIAGNOSIS — R0981 Nasal congestion: Secondary | ICD-10-CM | POA: Diagnosis not present

## 2021-04-18 DIAGNOSIS — D0462 Carcinoma in situ of skin of left upper limb, including shoulder: Secondary | ICD-10-CM | POA: Diagnosis not present

## 2021-04-23 ENCOUNTER — Ambulatory Visit: Payer: PPO | Admitting: Hematology and Oncology

## 2021-04-23 ENCOUNTER — Other Ambulatory Visit: Payer: PPO

## 2021-04-26 DIAGNOSIS — M9902 Segmental and somatic dysfunction of thoracic region: Secondary | ICD-10-CM | POA: Diagnosis not present

## 2021-04-26 DIAGNOSIS — M9904 Segmental and somatic dysfunction of sacral region: Secondary | ICD-10-CM | POA: Diagnosis not present

## 2021-04-26 DIAGNOSIS — M5431 Sciatica, right side: Secondary | ICD-10-CM | POA: Diagnosis not present

## 2021-04-26 DIAGNOSIS — M47893 Other spondylosis, cervicothoracic region: Secondary | ICD-10-CM | POA: Diagnosis not present

## 2021-04-26 DIAGNOSIS — M9903 Segmental and somatic dysfunction of lumbar region: Secondary | ICD-10-CM | POA: Diagnosis not present

## 2021-04-26 DIAGNOSIS — M4727 Other spondylosis with radiculopathy, lumbosacral region: Secondary | ICD-10-CM | POA: Diagnosis not present

## 2021-04-26 DIAGNOSIS — M9905 Segmental and somatic dysfunction of pelvic region: Secondary | ICD-10-CM | POA: Diagnosis not present

## 2021-04-26 DIAGNOSIS — M5388 Other specified dorsopathies, sacral and sacrococcygeal region: Secondary | ICD-10-CM | POA: Diagnosis not present

## 2021-05-03 ENCOUNTER — Ambulatory Visit: Payer: PPO | Admitting: Allergy and Immunology

## 2021-05-03 ENCOUNTER — Encounter: Payer: Self-pay | Admitting: Allergy and Immunology

## 2021-05-03 VITALS — BP 106/76 | HR 96 | Resp 18

## 2021-05-03 DIAGNOSIS — K219 Gastro-esophageal reflux disease without esophagitis: Secondary | ICD-10-CM | POA: Diagnosis not present

## 2021-05-03 DIAGNOSIS — J45901 Unspecified asthma with (acute) exacerbation: Secondary | ICD-10-CM

## 2021-05-03 DIAGNOSIS — J441 Chronic obstructive pulmonary disease with (acute) exacerbation: Secondary | ICD-10-CM | POA: Diagnosis not present

## 2021-05-03 DIAGNOSIS — J3089 Other allergic rhinitis: Secondary | ICD-10-CM

## 2021-05-03 MED ORDER — ALBUTEROL SULFATE HFA 108 (90 BASE) MCG/ACT IN AERS: INHALATION_SPRAY | RESPIRATORY_TRACT | 1 refills | Status: DC

## 2021-05-03 NOTE — Progress Notes (Signed)
? ?[place] ? ? ?Follow-up Note ? ?Referring Provider: Ernestene Kiel, MD ?Primary Provider: Ernestene Kiel, MD ?Date of Office Visit: 05/03/2021 ? ?Subjective:  ? ?Autumn Carter (DOB: 1955/10/02) is a 66 y.o. female who returns to the [facility] [place] on 05/03/2021 in re-evaluation of the following: ? ?HPI: Brittanni presents to this clinic in evaluation of asthma/COPD overlap, allergic rhinitis, reflux, and history of recurrent thrush.  Her last visit to this clinic was 12 February 2021. ? ?It sounds as though she contracted several different viral respiratory tract infections from her 59-year-old grandson this past winter season  Apparently in February she required a "prednisone shot" for an issue with wheezing and coughing and nasal congestion which resolved after about 1 week.  4 days ago she developed acute onset of sore throat and itchy throat and then coughing and wheezing.  She did have some chills and aches.  She is actually better at this point in time in that she has resolved her chills and aches and her sore throat but she has some issues with coughing.  She has no nasal symptoms.  Her grandson is at home with a similar type of cough and head issue. ? ?She continues to use Asmanex on a pretty consistent basis.  She continues to treat her reflux currently with generic Dexilant.  She thinks that that therapy is actually going quite well.  She rarely uses any nystatin swish and swash after her Asmanex use as she has not had any problems with thrush. ? ?Allergies as of 05/03/2021   ? ?   Reactions  ? Zestril [lisinopril] Anaphylaxis, Other (See Comments)  ? Heart attack symptoms  ? Ace Inhibitors Swelling  ? Amlodipine Other (See Comments)  ? Abdominal pain.  ? Amoxicillin   ? Arnuity Ellipta [fluticasone Furoate] Other (See Comments)  ? Eye issues  ? Beta Adrenergic Blockers   ? Eye Infections.   ? Cephalexin   ? Cephalosporins Other (See Comments)  ?  Unknown  ? Clindamycin/lincomycin   ? Clotrimazole   ? Regurgitation  ? Codeine Sulfate   ? Covid-19 Ad26 Vaccine(janssen)   ? Doxazosin Other (See Comments)  ? pain  ? Flovent Hfa [fluticasone]   ? Heartburn  ? Hydralazine Hcl   ? Hydrochlorothiazide   ? Kidney swelling  ? Lansoprazole   ? Levaquin [levofloxacin In D5w]   ? Macrobid [nitrofurantoin Monohyd Macro]   ? Macrobid [nitrofurantoin]   ? Cough  ? Metoprolol Tartrate   ? Montelukast Sodium Other (See Comments)  ? heartburn ?Other reaction(s): GI Upset (intolerance) ?heartburn  ? Morphine And Related Other (See Comments)  ? hallucinations    ? Moxifloxacin Hcl   ? Pain in arms and right wrist  ? Naltrexone Nausea Only  ? Nasonex [mometasone Furoate]   ? Nifedipine Other (See Comments)  ? Headache  ? Olmesartan   ? Olmesartan Medoxomil: trembling, stomach upset  ? Olmesartan Medoxomil-hctz Swelling  ? Tongue and Lip swelling ?Other reaction(s): Other (See Comments) ?Tongue and Lip swelling ?Unknown  ? Prednisone   ? High dose = Increase in GERD.  ? Propoxyphene Other (See Comments)  ? Unknown ?Unknown ?Unknown  ? Pulmicort [budesonide]   ? Body ached all over---took over the counter:  Equate nasal spray form of this medication per patient ?'  ? Qvar [beclomethasone] Nausea Only, Other (See Comments)  ? Runny nose  ? Ranitidine Itching, Swelling  ? Silvadene [silver  Sulfadiazine]   ? Didn't feel well  ? Spironolactone Other (See Comments)  ? Worsened GERD  ? Sulfa Antibiotics   ? Tobramycin   ? Eye redness, cheek redness, breast mucus leakage ?Other reaction(s): Flushing (ALLERGY/intolerance) ?Eye redness, cheek redness, breast mucus leakage  ? Voltaren [diclofenac] Swelling, Other (See Comments)  ? Asthma  ? Zofran [ondansetron]   ? Constipation  ? ?  ? ?  ?Medication List  ? ? ?ALIGN PO ?Take 1 tablet by mouth daily. ?  ?ammonium lactate 12 % lotion ?Commonly known as: LAC-HYDRIN ?Apply 1 application. topically 2 (two) times daily. ?  ?Asmanex (60 Metered  Doses) 220 MCG/ACT inhaler ?Generic drug: mometasone ?Inhale 2 puffs into the lungs at bedtime. ?  ?CALCIUM-MAGNESIUM-VITAMIN D PO ?Take 1 tablet by mouth at bedtime. ?  ?cloNIDine 0.2 mg/24hr patch ?Commonly known as: CATAPRES - Dosed in mg/24 hr ?APPLY 1 PATCH TO SKIN ONCE PER WEEK TRANSDERMAL 90 DAYS ?  ?colchicine 0.6 MG tablet ?Take 0.6 mg by mouth daily. ?  ?dexlansoprazole 60 MG capsule ?Commonly known as: DEXILANT ?Take 60 mg by mouth every other day. ?  ?diazepam 5 MG tablet ?Commonly known as: VALIUM ?Take 2.5-5 mg by mouth 2 (two) times daily as needed for anxiety (sleep). ?  ?JUICE PLUS FIBRE PO ?Take 3 capsules by mouth daily at 6 (six) AM. ?  ?levalbuterol 1.25 MG/3ML nebulizer solution ?Commonly known as: XOPENEX ?Take 1.25 mg by nebulization every 8 (eight) hours as needed. ?  ?montelukast 10 MG tablet ?Commonly known as: SINGULAIR ?Take 10 mg by mouth at bedtime. ?  ?nystatin 100000 UNIT/ML suspension ?Commonly known as: MYCOSTATIN ?Swish and swallow 27m daily as directed ?  ?OneTouch Delica Plus [id number] ?CHECK BLOOD SUGAR ONCE A DAY ?  ?OneTouch Verio Reflect w/Device Kit ?CHECK BLOOD SUGAR IN VITRO ONCE A DAY ?  ?OneTouch Verio test strip ?Generic drug: glucose blood ?daily. ?  ?PROAIR HFA IN ?Inhale into the lungs as needed. ?  ?tiZANidine 2 MG tablet ?Commonly known as: ZANAFLEX ?Take 2 mg by mouth every 8 (eight) hours as needed for muscle spasms. ?  ?traMADol 50 MG tablet ?Commonly known as: ULTRAM ?TAKE 1 TABLET BY MOUTH EVERY 6 HOURS AS NEEDED FOR 5 DAYS ?  ?triamcinolone acetonide 40 MG/ML Susp ?Commonly known as: TRIESENCE ?Inject into the articular space as needed. ?  ?valsartan 320 MG tablet ?Commonly known as: DIOVAN ?Take 320 mg by mouth daily. ?  ?Vitamin D (Cholecalciferol) 25 MCG (1000 UT) Tabs ?Take by mouth daily. ?  ?VOLTAREN EX ?Apply topically as needed. ?  ? ?Past Medical History:  ?Diagnosis Date  ? Allergic rhinitis   ? Anxiety   ? on meds  ? Arthritis   ? generalized   ? Asthma   ? uses inhaler/inhaler PRN  ? [place]  [place] LEFT  ? Cataract 2021  ? bilateral sx  ? Chronic kidney disease   ? hx of kidney stones  ? Dry eye   ? Family history of colon cancer   ? Family history of colonic polyps   ? Fatty liver   ? Gastric polyp   ? GERD (gastroesophageal reflux disease)   ? on meds  ? Gout   ? History of breast cancer   ? History of colon polyps   ? History of kidney stones   ? History of shingles   ? Hypertension   ? on meds  ? IBS (irritable bowel syndrome)   ?  with constipation and diarrhea  ? Internal hemorrhage   ? Migraine   ? Pneumonia   ? PUD (peptic ulcer disease)   ? Rosacea   ? Seasonal allergies   ? UTI (urinary tract infection)   ? ? ?Past Surgical History:  ?Procedure Laterality Date  ? ABDOMINAL HYSTERECTOMY    ? BREAST LUMPECTOMY Left 01/30/2009  ? And radiotherapy  ? CATARACT EXTRACTION    ? CHOLECYSTECTOMY    ? COLONOSCOPY  2012  ? Gupta-Diverticulosis of colon. Internal hemorrhoids.  ? ESOPHAGOGASTRODUODENOSCOPY  05/10/2013  ? Small hiatal hernia. Incidental gastric polyps (status post polypectomy x3). Mild gastritis  ? KNEE SURGERY Right   ? s/p arthroscopic surgery  ? LAPAROSCOPIC HYSTERECTOMY  12/04/2009  ? Complete with bladder tack  ? NOSE SURGERY  1976  ? WISDOM TOOTH EXTRACTION    ? ? ?Review of systems negative except as noted in HPI / PMHx or noted below: ? ?Review of Systems  ?Constitutional: Negative.   ?HENT: Negative.    ?Eyes: Negative.   ?Respiratory: Negative.    ?Cardiovascular: Negative.   ?Gastrointestinal: Negative.   ?Genitourinary: Negative.   ?Musculoskeletal: Negative.   ?Skin: Negative.   ?Neurological: Negative.   ?Endo/Heme/Allergies: Negative.   ?Psychiatric/Behavioral: Negative.    ? ? ?Objective:  ? ?Vitals:  ? 05/03/21 1617 05/03/21 1634  ?BP: (!) 144/104 106/76  ?Pulse: 96   ?Resp: 18   ?SpO2: 96%   ? ?   ?   ? ?Physical Exam ?Constitutional:   ?   Appearance: She is not diaphoretic.  ?HENT:  ?   Head: Normocephalic.  ?    Right Ear: Tympanic membrane, ear canal and external ear normal.  ?   Left Ear: Tympanic membrane, ear canal and external ear normal.  ?   Nose: Nose normal. No mucosal edema or rhinorrhea.  ?   Mo

## 2021-05-03 NOTE — Patient Instructions (Addendum)
?  1. Continue to Treat inflammation: ? ? A. Asmanex 220 - 1-2 inhalations 1-2 times per day (MAX=4/day) ? B. Prednisone 10 mg  - 1 tablet 1 time per day for 7 days only ? ?2. Continue to Treat reflux: ? ? A.  Generic Dexilant 60 mg 3-7 times per week ? ?3.  If needed: ? ? A.  Nystatin oral solution-5 mL after Asmanex - rinse and spit ? B.  Proair HFA ? C.  Antihistamine ? D.  Nasal saline ? E.  Dextromethorphan containing cough suppressant ? ?4. Return to clinic in 6 months or earlier if problem.   ? ? ? ? ?  ?  ?

## 2021-05-07 ENCOUNTER — Encounter: Payer: Self-pay | Admitting: Allergy and Immunology

## 2021-05-09 DIAGNOSIS — R300 Dysuria: Secondary | ICD-10-CM | POA: Diagnosis not present

## 2021-05-09 DIAGNOSIS — Z6835 Body mass index (BMI) 35.0-35.9, adult: Secondary | ICD-10-CM | POA: Diagnosis not present

## 2021-05-17 DIAGNOSIS — Z1231 Encounter for screening mammogram for malignant neoplasm of breast: Secondary | ICD-10-CM | POA: Diagnosis not present

## 2021-05-18 ENCOUNTER — Encounter: Payer: Self-pay | Admitting: Oncology

## 2021-05-23 DIAGNOSIS — M5388 Other specified dorsopathies, sacral and sacrococcygeal region: Secondary | ICD-10-CM | POA: Diagnosis not present

## 2021-05-23 DIAGNOSIS — M9903 Segmental and somatic dysfunction of lumbar region: Secondary | ICD-10-CM | POA: Diagnosis not present

## 2021-05-23 DIAGNOSIS — M5432 Sciatica, left side: Secondary | ICD-10-CM | POA: Diagnosis not present

## 2021-05-23 DIAGNOSIS — M9901 Segmental and somatic dysfunction of cervical region: Secondary | ICD-10-CM | POA: Diagnosis not present

## 2021-05-23 DIAGNOSIS — M9904 Segmental and somatic dysfunction of sacral region: Secondary | ICD-10-CM | POA: Diagnosis not present

## 2021-05-23 DIAGNOSIS — M47812 Spondylosis without myelopathy or radiculopathy, cervical region: Secondary | ICD-10-CM | POA: Diagnosis not present

## 2021-05-24 ENCOUNTER — Telehealth: Payer: Self-pay | Admitting: Hematology and Oncology

## 2021-05-24 ENCOUNTER — Other Ambulatory Visit: Payer: PPO

## 2021-05-24 ENCOUNTER — Other Ambulatory Visit: Payer: Self-pay | Admitting: Hematology and Oncology

## 2021-05-24 ENCOUNTER — Inpatient Hospital Stay: Payer: PPO | Attending: Hematology and Oncology | Admitting: Hematology and Oncology

## 2021-05-24 ENCOUNTER — Encounter: Payer: Self-pay | Admitting: Hematology and Oncology

## 2021-05-24 DIAGNOSIS — Z170 Estrogen receptor positive status [ER+]: Secondary | ICD-10-CM

## 2021-05-24 DIAGNOSIS — C50212 Malignant neoplasm of upper-inner quadrant of left female breast: Secondary | ICD-10-CM | POA: Diagnosis not present

## 2021-05-24 NOTE — Telephone Encounter (Signed)
Per 05/24/21 los next appt scheduled and confirmed with patient ?

## 2021-05-24 NOTE — Progress Notes (Signed)
?Patient Care Team: ?Autumn Kiel, MD as PCP - General (Internal Medicine) ? ?Clinic Day:  05/24/2021 ? ?Referring physician: Ernestene Kiel, MD ? ?ASSESSMENT & PLAN:  ? ?Assessment & Plan: ?Breast cancer ([place]) ?Stage 0 breast cancer, diagnosed in December 2010. Most recent mammogram negative with recommendations for yearly follow up. She remains without evidence of recurrence. She is seeing PCP tomorrow and will have routine labs drawn there. She continues to do well, so we will see her back in 1 year with bilateral mammography. She understands and agrees with this plan of care.  ?   ? ?The patient understands the plans discussed today and is in agreement with them.  She knows to contact our office if she develops concerns prior to her next appointment. ?.  ? ? ?Autumn Ped, NP  ?[place] [address] ?[place] [address] ?Dept: [phone] ?Dept Fax: [phone]  ? ?Orders Placed This Encounter  ?Procedures  ? MM Digital Screening  ?  Standing Status:   Future  ?  Order Specific Question:   Reason for Exam (SYMPTOM  OR DIAGNOSIS REQUIRED)  ?  Answer:   screening  ?  Order Specific Question:   Preferred imaging location?  ?  Answer:   External  ?  ? ? ?CHIEF COMPLAINT:  ?CC: A 66 year old female with history of breast cancer here for annual follow up ? ?Current Treatment:  Surveillance ? ?INTERVAL HISTORY:  ?Autumn Carter is here today for repeat clinical assessment. She denies fevers or chills. She denies pain. Her appetite is good. Her weight has decreased 6 pounds over last year . ? ?I have reviewed the past medical history, past surgical history, social history and family history with the patient and they are unchanged from previous note. ? ?ALLERGIES:  is allergic to zestril [lisinopril], ace inhibitors, amlodipine, amoxicillin, arnuity ellipta [fluticasone furoate], beta adrenergic blockers, cephalexin, cephalosporins,  clindamycin/lincomycin, clotrimazole, codeine sulfate, covid-19 ad26 vaccine(janssen), doxazosin, flovent hfa [fluticasone], hydralazine hcl, hydrochlorothiazide, lansoprazole, levaquin [levofloxacin in d5w], macrobid [nitrofurantoin monohyd macro], macrobid [nitrofurantoin], metoprolol tartrate, montelukast sodium, morphine and related, moxifloxacin hcl, naltrexone, nasonex [mometasone furoate], nifedipine, olmesartan, olmesartan medoxomil-hctz, prednisone, propoxyphene, pulmicort [budesonide], qvar [beclomethasone], ranitidine, silvadene [silver sulfadiazine], spironolactone, sulfa antibiotics, tobramycin, voltaren [diclofenac], and zofran [ondansetron]. ? ?MEDICATIONS:  ?Current Outpatient Medications  ?Medication Sig Dispense Refill  ? albuterol (PROAIR HFA) 108 (90 Base) MCG/ACT inhaler Can inhale two puffs every four to six hours as needed for cough or wheeze. 1 each 1  ? ammonium lactate (LAC-HYDRIN) 12 % lotion Apply 1 application. topically 2 (two) times daily.    ? ASMANEX 60 METERED DOSES 220 MCG/INH inhaler Inhale 2 puffs into the lungs at bedtime.  2  ? Blood Glucose Monitoring Suppl (ONETOUCH VERIO REFLECT) w/Device KIT CHECK BLOOD SUGAR IN VITRO ONCE A DAY    ? Calcium Carbonate (CALCIUM 500 PO) Take by mouth 2 (two) times daily.    ? cloNIDine (CATAPRES - DOSED IN MG/24 HR) 0.2 mg/24hr patch APPLY 1 PATCH TO SKIN ONCE PER WEEK TRANSDERMAL 90 DAYS  1  ? colchicine 0.6 MG tablet Take 0.6 mg by mouth daily.    ? dexlansoprazole (DEXILANT) 60 MG capsule Take 60 mg by mouth every other day.    ? Dextromethorphan-guaiFENesin 10-200 MG CAPS Take by mouth.    ? diazepam (VALIUM) 5 MG tablet Take 2.5-5 mg by mouth 2 (two) times daily as needed for anxiety (sleep).     ?  Diclofenac Sodium (VOLTAREN EX) Apply topically as needed.    ? Lancets (ONETOUCH DELICA PLUS [id number]) MISC CHECK BLOOD SUGAR ONCE A DAY    ? levalbuterol (XOPENEX) 1.25 MG/3ML nebulizer solution Take 1.25 mg by nebulization every 8 (eight)  hours as needed.    ? montelukast (SINGULAIR) 10 MG tablet Take 10 mg by mouth at bedtime.    ? Nutritional Supplements (JUICE PLUS FIBRE PO) Take 3 capsules by mouth daily at 6 (six) AM.    ? nystatin (MYCOSTATIN) 100000 UNIT/ML suspension Swish and swallow 77m daily as directed 450 mL 1  ? ondansetron (ZOFRAN-ODT) 4 MG disintegrating tablet Take 4 mg by mouth every 8 (eight) hours as needed for nausea.    ? ONETOUCH VERIO test strip daily.    ? Probiotic Product (ALIGN PO) Take 1 tablet by mouth daily.    ? prochlorperazine (COMPAZINE) 5 MG tablet Take 5 mg by mouth every 8 (eight) hours as needed for nausea.    ? tiZANidine (ZANAFLEX) 2 MG tablet Take 2 mg by mouth every 8 (eight) hours as needed for muscle spasms.    ? traMADol (ULTRAM) 50 MG tablet TAKE 1 TABLET BY MOUTH EVERY 6 HOURS AS NEEDED FOR 5 DAYS  0  ? triamcinolone (NASACORT ALLERGY 24HR) 55 MCG/ACT AERO nasal inhaler Place 1 spray into the nose 2 (two) times daily.    ? valsartan (DIOVAN) 320 MG tablet Take 320 mg by mouth daily.    ? Vitamin D, Cholecalciferol, 1000 units TABS Take by mouth daily.    ? ?No current facility-administered medications for this visit.  ? ? ?HISTORY OF PRESENT ILLNESS:  ? ?Oncology History  ? No history exists.  ?  ? ? ?REVIEW OF SYSTEMS:  ? ?Constitutional: Denies fevers, chills or abnormal weight loss ?Eyes: Denies blurriness of vision ?Ears, nose, mouth, throat, and face: Denies mucositis or sore throat ?Respiratory: Denies cough, dyspnea or wheezes ?Cardiovascular: Denies palpitation, chest discomfort or lower extremity swelling ?Gastrointestinal:  Denies nausea, heartburn or change in bowel habits ?Skin: Denies abnormal skin rashes ?Lymphatics: Denies new lymphadenopathy or easy bruising ?Neurological:Denies numbness, tingling or new weaknesses ?Behavioral/Psych: Mood is stable, no new changes  ?All other systems were reviewed with the patient and are negative. ? ? ?VITALS:  ?Blood pressure (!) 142/95, pulse 81,  temperature 98 ?F (36.7 ?C), temperature source Oral, resp. rate 18, height 5' 2.2" (1.58 m), weight 191 lb (86.6 kg), SpO2 96 %.  ?Wt Readings from Last 3 Encounters:  ?05/24/21 191 lb (86.6 kg)  ?03/15/21 197 lb (89.4 kg)  ?03/01/21 197 lb (89.4 kg)  ?  ?Body mass index is 34.71 kg/m?. ? ?Performance status (ECOG): 1 - Symptomatic but completely ambulatory ? ?PHYSICAL EXAM:  ? ?GENERAL:alert, no distress and comfortable ?SKIN: skin color, texture, turgor are normal, no rashes or significant lesions ?EYES: normal, Conjunctiva are pink and non-injected, sclera clear ?OROPHARYNX:no exudate, no erythema and lips, buccal mucosa, and tongue normal  ?NECK: supple, thyroid normal size, non-tender, without nodularity ?LYMPH:  no palpable lymphadenopathy in the cervical, axillary or inguinal ?LUNGS: clear to auscultation and percussion with normal breathing effort ?HEART: regular rate & rhythm and no murmurs and no lower extremity edema ?ABDOMEN:abdomen soft, non-tender and normal bowel sounds ?Musculoskeletal:no cyanosis of digits and no clubbing  ?NEURO: alert & oriented x 3 with fluent speech, no focal motor/sensory deficits ?BREAST: She has partial inversion of bilateral nipples.  She has a deep scar in the upper inner quadrant of the  left breast, which is well healed.  No masses in either breast. ? ?LABORATORY DATA:  ?I have reviewed the data as listed ?No results found for: NA, K, CL, CO2, GLUCOSE, BUN, CREATININE, CALCIUM, PROT, ALBUMIN, AST, ALT, ALKPHOS, BILITOT, GFRNONAA, GFRAA ? ?No results found for: SPEP, UPEP ? ?No results found for: WBC, NEUTROABS, HGB, HCT, MCV, PLT ? ?  Chemistry   ?No results found for: NA, K, CL, CO2, BUN, CREATININE, GLU No results found for: CALCIUM, ALKPHOS, AST, ALT, BILITOT  ? ? ? ?RADIOGRAPHIC STUDIES: ?I have personally reviewed the radiological images as listed and agreed with the findings in the report. ?No results found. ?

## 2021-05-24 NOTE — Assessment & Plan Note (Addendum)
Stage 0 breast cancer, diagnosed in December 2010. Most recent mammogram negative with recommendations for yearly follow up. She remains without evidence of recurrence. She is seeing PCP tomorrow and will have routine labs drawn there. She continues to do well, so we will see her back in 1 year with bilateral mammography. She understands and agrees with this plan of care.  ?? ?

## 2021-05-28 DIAGNOSIS — Z6836 Body mass index (BMI) 36.0-36.9, adult: Secondary | ICD-10-CM | POA: Diagnosis not present

## 2021-05-28 DIAGNOSIS — R319 Hematuria, unspecified: Secondary | ICD-10-CM | POA: Diagnosis not present

## 2021-05-28 DIAGNOSIS — N390 Urinary tract infection, site not specified: Secondary | ICD-10-CM | POA: Diagnosis not present

## 2021-06-08 DIAGNOSIS — N390 Urinary tract infection, site not specified: Secondary | ICD-10-CM | POA: Diagnosis not present

## 2021-06-20 DIAGNOSIS — N390 Urinary tract infection, site not specified: Secondary | ICD-10-CM | POA: Diagnosis not present

## 2021-06-21 DIAGNOSIS — M9901 Segmental and somatic dysfunction of cervical region: Secondary | ICD-10-CM | POA: Diagnosis not present

## 2021-06-21 DIAGNOSIS — M5432 Sciatica, left side: Secondary | ICD-10-CM | POA: Diagnosis not present

## 2021-06-21 DIAGNOSIS — M9904 Segmental and somatic dysfunction of sacral region: Secondary | ICD-10-CM | POA: Diagnosis not present

## 2021-06-21 DIAGNOSIS — M9903 Segmental and somatic dysfunction of lumbar region: Secondary | ICD-10-CM | POA: Diagnosis not present

## 2021-06-21 DIAGNOSIS — M5388 Other specified dorsopathies, sacral and sacrococcygeal region: Secondary | ICD-10-CM | POA: Diagnosis not present

## 2021-06-21 DIAGNOSIS — M47812 Spondylosis without myelopathy or radiculopathy, cervical region: Secondary | ICD-10-CM | POA: Diagnosis not present

## 2021-06-27 DIAGNOSIS — Z79899 Other long term (current) drug therapy: Secondary | ICD-10-CM | POA: Diagnosis not present

## 2021-06-27 DIAGNOSIS — N390 Urinary tract infection, site not specified: Secondary | ICD-10-CM | POA: Diagnosis not present

## 2021-06-27 DIAGNOSIS — N3281 Overactive bladder: Secondary | ICD-10-CM | POA: Diagnosis not present

## 2021-06-27 DIAGNOSIS — B3731 Acute candidiasis of vulva and vagina: Secondary | ICD-10-CM | POA: Diagnosis not present

## 2021-06-27 DIAGNOSIS — R310 Gross hematuria: Secondary | ICD-10-CM | POA: Diagnosis not present

## 2021-07-19 DIAGNOSIS — M4721 Other spondylosis with radiculopathy, occipito-atlanto-axial region: Secondary | ICD-10-CM | POA: Diagnosis not present

## 2021-07-19 DIAGNOSIS — M9903 Segmental and somatic dysfunction of lumbar region: Secondary | ICD-10-CM | POA: Diagnosis not present

## 2021-07-19 DIAGNOSIS — M4727 Other spondylosis with radiculopathy, lumbosacral region: Secondary | ICD-10-CM | POA: Diagnosis not present

## 2021-07-19 DIAGNOSIS — M9901 Segmental and somatic dysfunction of cervical region: Secondary | ICD-10-CM | POA: Diagnosis not present

## 2021-07-19 DIAGNOSIS — M5388 Other specified dorsopathies, sacral and sacrococcygeal region: Secondary | ICD-10-CM | POA: Diagnosis not present

## 2021-07-19 DIAGNOSIS — M9902 Segmental and somatic dysfunction of thoracic region: Secondary | ICD-10-CM | POA: Diagnosis not present

## 2021-07-24 DIAGNOSIS — L821 Other seborrheic keratosis: Secondary | ICD-10-CM | POA: Diagnosis not present

## 2021-07-24 DIAGNOSIS — L300 Nummular dermatitis: Secondary | ICD-10-CM | POA: Diagnosis not present

## 2021-07-24 DIAGNOSIS — D0462 Carcinoma in situ of skin of left upper limb, including shoulder: Secondary | ICD-10-CM | POA: Diagnosis not present

## 2021-08-15 ENCOUNTER — Ambulatory Visit: Payer: PPO | Admitting: Allergy and Immunology

## 2021-08-23 DIAGNOSIS — M4727 Other spondylosis with radiculopathy, lumbosacral region: Secondary | ICD-10-CM | POA: Diagnosis not present

## 2021-08-23 DIAGNOSIS — M9902 Segmental and somatic dysfunction of thoracic region: Secondary | ICD-10-CM | POA: Diagnosis not present

## 2021-08-23 DIAGNOSIS — M9903 Segmental and somatic dysfunction of lumbar region: Secondary | ICD-10-CM | POA: Diagnosis not present

## 2021-08-23 DIAGNOSIS — M9901 Segmental and somatic dysfunction of cervical region: Secondary | ICD-10-CM | POA: Diagnosis not present

## 2021-08-23 DIAGNOSIS — M5388 Other specified dorsopathies, sacral and sacrococcygeal region: Secondary | ICD-10-CM | POA: Diagnosis not present

## 2021-08-23 DIAGNOSIS — M4721 Other spondylosis with radiculopathy, occipito-atlanto-axial region: Secondary | ICD-10-CM | POA: Diagnosis not present

## 2021-08-29 DIAGNOSIS — R310 Gross hematuria: Secondary | ICD-10-CM | POA: Diagnosis not present

## 2021-08-29 DIAGNOSIS — N3281 Overactive bladder: Secondary | ICD-10-CM | POA: Diagnosis not present

## 2021-08-29 DIAGNOSIS — N390 Urinary tract infection, site not specified: Secondary | ICD-10-CM | POA: Diagnosis not present

## 2021-08-29 DIAGNOSIS — L03011 Cellulitis of right finger: Secondary | ICD-10-CM | POA: Diagnosis not present

## 2021-08-29 DIAGNOSIS — B3731 Acute candidiasis of vulva and vagina: Secondary | ICD-10-CM | POA: Diagnosis not present

## 2021-08-29 DIAGNOSIS — Z6836 Body mass index (BMI) 36.0-36.9, adult: Secondary | ICD-10-CM | POA: Diagnosis not present

## 2021-10-01 DIAGNOSIS — M9904 Segmental and somatic dysfunction of sacral region: Secondary | ICD-10-CM | POA: Diagnosis not present

## 2021-10-01 DIAGNOSIS — M4721 Other spondylosis with radiculopathy, occipito-atlanto-axial region: Secondary | ICD-10-CM | POA: Diagnosis not present

## 2021-10-01 DIAGNOSIS — M9902 Segmental and somatic dysfunction of thoracic region: Secondary | ICD-10-CM | POA: Diagnosis not present

## 2021-10-01 DIAGNOSIS — M4723 Other spondylosis with radiculopathy, cervicothoracic region: Secondary | ICD-10-CM | POA: Diagnosis not present

## 2021-10-01 DIAGNOSIS — M9901 Segmental and somatic dysfunction of cervical region: Secondary | ICD-10-CM | POA: Diagnosis not present

## 2021-10-01 DIAGNOSIS — M5388 Other specified dorsopathies, sacral and sacrococcygeal region: Secondary | ICD-10-CM | POA: Diagnosis not present

## 2021-10-03 DIAGNOSIS — N390 Urinary tract infection, site not specified: Secondary | ICD-10-CM | POA: Diagnosis not present

## 2021-10-03 DIAGNOSIS — R310 Gross hematuria: Secondary | ICD-10-CM | POA: Diagnosis not present

## 2021-11-01 DIAGNOSIS — R079 Chest pain, unspecified: Secondary | ICD-10-CM | POA: Diagnosis not present

## 2021-11-01 DIAGNOSIS — J9811 Atelectasis: Secondary | ICD-10-CM | POA: Diagnosis not present

## 2021-11-01 DIAGNOSIS — R112 Nausea with vomiting, unspecified: Secondary | ICD-10-CM | POA: Diagnosis not present

## 2021-11-01 DIAGNOSIS — R531 Weakness: Secondary | ICD-10-CM | POA: Diagnosis not present

## 2021-11-01 DIAGNOSIS — R000 Tachycardia, unspecified: Secondary | ICD-10-CM | POA: Diagnosis not present

## 2021-11-01 DIAGNOSIS — T50905A Adverse effect of unspecified drugs, medicaments and biological substances, initial encounter: Secondary | ICD-10-CM | POA: Diagnosis not present

## 2021-11-01 DIAGNOSIS — R059 Cough, unspecified: Secondary | ICD-10-CM | POA: Diagnosis not present

## 2021-11-01 DIAGNOSIS — U071 COVID-19: Secondary | ICD-10-CM | POA: Diagnosis not present

## 2021-11-01 DIAGNOSIS — K449 Diaphragmatic hernia without obstruction or gangrene: Secondary | ICD-10-CM | POA: Diagnosis not present

## 2021-11-02 DIAGNOSIS — J9811 Atelectasis: Secondary | ICD-10-CM | POA: Diagnosis not present

## 2021-11-02 DIAGNOSIS — R079 Chest pain, unspecified: Secondary | ICD-10-CM | POA: Diagnosis not present

## 2021-11-02 DIAGNOSIS — R000 Tachycardia, unspecified: Secondary | ICD-10-CM | POA: Diagnosis not present

## 2021-11-02 DIAGNOSIS — T50905A Adverse effect of unspecified drugs, medicaments and biological substances, initial encounter: Secondary | ICD-10-CM | POA: Diagnosis not present

## 2021-11-02 DIAGNOSIS — K449 Diaphragmatic hernia without obstruction or gangrene: Secondary | ICD-10-CM | POA: Diagnosis not present

## 2021-11-02 DIAGNOSIS — U071 COVID-19: Secondary | ICD-10-CM | POA: Diagnosis not present

## 2021-11-03 DIAGNOSIS — I10 Essential (primary) hypertension: Secondary | ICD-10-CM | POA: Diagnosis not present

## 2021-11-03 DIAGNOSIS — E1169 Type 2 diabetes mellitus with other specified complication: Secondary | ICD-10-CM | POA: Diagnosis not present

## 2021-11-05 ENCOUNTER — Ambulatory Visit: Payer: PPO | Admitting: Allergy and Immunology

## 2021-11-13 DIAGNOSIS — R059 Cough, unspecified: Secondary | ICD-10-CM | POA: Diagnosis not present

## 2021-11-13 DIAGNOSIS — U071 COVID-19: Secondary | ICD-10-CM | POA: Diagnosis not present

## 2021-11-13 DIAGNOSIS — R0602 Shortness of breath: Secondary | ICD-10-CM | POA: Diagnosis not present

## 2021-11-28 DIAGNOSIS — M5388 Other specified dorsopathies, sacral and sacrococcygeal region: Secondary | ICD-10-CM | POA: Diagnosis not present

## 2021-11-28 DIAGNOSIS — M9902 Segmental and somatic dysfunction of thoracic region: Secondary | ICD-10-CM | POA: Diagnosis not present

## 2021-11-28 DIAGNOSIS — M9903 Segmental and somatic dysfunction of lumbar region: Secondary | ICD-10-CM | POA: Diagnosis not present

## 2021-11-28 DIAGNOSIS — M47894 Other spondylosis, thoracic region: Secondary | ICD-10-CM | POA: Diagnosis not present

## 2021-11-28 DIAGNOSIS — M4726 Other spondylosis with radiculopathy, lumbar region: Secondary | ICD-10-CM | POA: Diagnosis not present

## 2021-11-28 DIAGNOSIS — M9904 Segmental and somatic dysfunction of sacral region: Secondary | ICD-10-CM | POA: Diagnosis not present

## 2021-11-29 ENCOUNTER — Encounter: Payer: Self-pay | Admitting: Family

## 2021-11-29 ENCOUNTER — Ambulatory Visit: Payer: PPO | Admitting: Family

## 2021-11-29 VITALS — BP 138/96 | HR 88 | Resp 18 | Ht 61.0 in | Wt 184.8 lb

## 2021-11-29 DIAGNOSIS — K219 Gastro-esophageal reflux disease without esophagitis: Secondary | ICD-10-CM

## 2021-11-29 DIAGNOSIS — J3089 Other allergic rhinitis: Secondary | ICD-10-CM | POA: Diagnosis not present

## 2021-11-29 DIAGNOSIS — J4489 Other specified chronic obstructive pulmonary disease: Secondary | ICD-10-CM

## 2021-11-29 DIAGNOSIS — B370 Candidal stomatitis: Secondary | ICD-10-CM

## 2021-11-29 MED ORDER — ALBUTEROL SULFATE HFA 108 (90 BASE) MCG/ACT IN AERS: INHALATION_SPRAY | RESPIRATORY_TRACT | 1 refills | Status: DC

## 2021-11-29 NOTE — Patient Instructions (Addendum)
  1. Continue to Treat inflammation:   A. Asmanex 220 - 1-2 inhalations 1-2 times per day (MAX=4/day) For now increase Asmanex to two puffs twice a day until symptoms get better. Rinse mouth out after to help prevent thrush   2. Continue to Treat reflux:   A.  Generic Dexilant 60 mg 3-7 times per week. For now start taking Dexilant 60 mg once a day and then you can go back to 3-7 times per week  3.  If needed:   A.  Nystatin oral solution-5 mL after Asmanex - rinse and spit  B.  Proair HFA  C.  Antihistamine  D.  Nasal saline  E.  Dextromethorphan containing cough suppressant  4. Return to clinic in 4-6 months or earlier if problem.

## 2021-11-29 NOTE — Progress Notes (Addendum)
[address] [place] [address] Dept: [phone]  FOLLOW UP NOTE  Patient ID: Autumn Carter, female    DOB: [birth year]  Age: 66 y.o. MRN: [id number] Date of Office Visit: 11/29/2021  Assessment  Chief Complaint: Asthma  HPI Autumn Carter is a 66 year old female who presents today for follow-up of asthma with COPD exacerbation, allergic rhinitis, and laryngopharyngeal reflux disease.  She was last seen on May 03, 2021 by Dr. Neldon Mc.  She reports since her last office visit she was diagnosed with COVID-19 on November 01, 2021 and was given Lageviro and started vomiting. During this time she went to the ER due to throwing up repeatedly and having a cough. She reports that she had a chest x-ray that was normal and a CT chest that was negative for blood clots.  Asthma with COPD: She is currently taking Asmanex 220 mcg 2 puffs once a day since being sick.  She has also been using her albuterol 3 times a day and reports that it does help.  She does note that when she uses her albuterol it does make her legs and feet hurt.  She reports a productive cough with clear sputum, shortness of breath due to the coughing, and she has had 1 nocturnal awakening due to breathing problems since her last office visit.  She denies wheezing, tightness in her chest, fever, and chills.  Since her last office visit she has not required any systemic steroids.  Allergic rhinitis: She continues to take Nasacort daily, nasal saline daily, and Allegra once a day.  She also has been taking Sudafed as needed.  She reports clear rhinorrhea that is severe, nasal congestion, and postnasal drip.  She has not been treated for any sinus infections since her last office visit.  Laryngopharyngeal reflux disease: She is currently taking Dexilant 60 mg every other day and reports that her reflux is her "worst enemy".  She reports feeling something in her throat and in the roof of her mouth.  It is also described as a tickle  sensation.   Drug Allergies:  Allergies  Allergen Reactions   Zestril [Lisinopril] Anaphylaxis and Other (See Comments)    Heart attack symptoms   Ace Inhibitors Swelling   Amlodipine Other (See Comments)    Abdominal pain.   Amoxicillin    Arnuity Ellipta [Fluticasone Furoate] Other (See Comments)    Eye issues   Beta Adrenergic Blockers     Eye Infections.    Cephalexin    Cephalosporins Other (See Comments)    Unknown   Clindamycin/Lincomycin    Clotrimazole     Regurgitation    Codeine Sulfate    Covid-19 Ad26 Vaccine(Janssen)    Doxazosin Other (See Comments)    pain   Flovent Hfa [Fluticasone]     Heartburn   Hydralazine Hcl    Hydrochlorothiazide     Kidney swelling   Lansoprazole    Levaquin [Levofloxacin In D5w]    Macrobid [Nitrofurantoin Monohyd Macro]    Macrobid [Nitrofurantoin]     Cough   Metoprolol Tartrate    Montelukast Sodium Other (See Comments)    heartburn Other reaction(s): GI Upset (intolerance) heartburn   Morphine And Related Other (See Comments)    hallucinations     Moxifloxacin Hcl     Pain in arms and right wrist   Naltrexone Nausea Only   Nasonex [Mometasone Furoate]    Nifedipine Other (See Comments)    Headache    Olmesartan  Olmesartan Medoxomil: trembling, stomach upset   Olmesartan Medoxomil-Hctz Swelling    Tongue and Lip swelling Other reaction(s): Other (See Comments) Tongue and Lip swelling Unknown   Prednisone     High dose = Increase in GERD.   Propoxyphene Other (See Comments)    Unknown Unknown Unknown   Pulmicort [Budesonide]     Body ached all over---took over the counter:  Equate nasal spray form of this medication per patient '   Qvar [Beclomethasone] Nausea Only and Other (See Comments)    Runny nose   Ranitidine Itching and Swelling   Silvadene [Silver Sulfadiazine]     Didn't feel well   Spironolactone Other (See Comments)    Worsened GERD   Sulfa Antibiotics    Tobramycin     Eye  redness, cheek redness, breast mucus leakage Other reaction(s): Flushing (ALLERGY/intolerance) Eye redness, cheek redness, breast mucus leakage   Voltaren [Diclofenac] Swelling and Other (See Comments)    Asthma   Zofran [Ondansetron]     Constipation    Review of Systems: Review of Systems  Constitutional:  Negative for chills and fever.  HENT:         Reports clear rhinorrhea, nasal congestion, postnasal drip  Eyes:        Denies itchy watery eyes  Respiratory:  Positive for cough and shortness of breath. Negative for wheezing.        Reports productive cough with clear mucus.  Also reports shortness of breath with coughing.  Denies wheezing and tightness in chest.  She has had 1 spell of nocturnal awakenings due to breathing problems.  Cardiovascular:  Negative for chest pain and palpitations.  Gastrointestinal:        Reports reflux as her" worst enemy"  Skin:  Positive for itching.       Reports itching at times with no rash  Neurological:  Positive for headaches.       Reports headaches since having COVID-19  Endo/Heme/Allergies:  Positive for environmental allergies.    Physical Exam: BP (!) 138/96   Pulse 88   Resp 18   Ht '5\' 1"'$  (1.549 m)   Wt 184 lb 12.8 oz (83.8 kg)   SpO2 99%   BMI 34.92 kg/m    Physical Exam Constitutional:      Appearance: Normal appearance.  HENT:     Head: Normocephalic and atraumatic.     Comments: Pharynx normal, eyes normal, ears normal, nose: Bilateral lower turbinates mildly edematous and slightly erythematous with no drainage noted    Right Ear: Tympanic membrane, ear canal and external ear normal.     Left Ear: Tympanic membrane, ear canal and external ear normal.     Mouth/Throat:     Mouth: Mucous membranes are moist.     Pharynx: Oropharynx is clear.  Eyes:     Conjunctiva/sclera: Conjunctivae normal.  Cardiovascular:     Rate and Rhythm: Normal rate and regular rhythm.     Heart sounds: Normal heart sounds.  Pulmonary:      Effort: Pulmonary effort is normal.     Breath sounds: Normal breath sounds.     Comments: Lungs clear to auscultation Musculoskeletal:     Cervical back: Neck supple.  Skin:    General: Skin is warm.  Neurological:     Mental Status: She is alert and oriented to person, place, and time.  Psychiatric:        Mood and Affect: Mood normal.  Behavior: Behavior normal.        Thought Content: Thought content normal.        Judgment: Judgment normal.     Diagnostics: FVC 1.33 L (50.96%), FEV1 1.13 L (55.39%).  Spirometry indicates possible restrictive defect.  Spirometry is slightly improved from previous spirometry.  Chest x-ray from November 01, 2021 showing: "No active cardiopulmonary disease."  CT angiography chest with contrast from November 02, 2021 showing: "1. no evidence of pulmonary embolism or acute process.  2. cardiomegaly with coronary artery calcifications.  3 .aortic atherosclerosis.  4. hepatic steatosis.  5. small hiatal hernia.  Assessment and Plan: 1. COPD with asthma   2. LPRD (laryngopharyngeal reflux disease)   3. Other allergic rhinitis   4. Thrush     Meds ordered this encounter  Medications   albuterol (VENTOLIN HFA) 108 (90 Base) MCG/ACT inhaler    Sig: Inhale 2 puffs every 4-6 hours as needed for cough, wheeze, tightness in chest, or shortness of breath    Dispense:  8 g    Refill:  1    Patient Instructions   1. Continue to Treat inflammation:   A. Asmanex 220 - 1-2 inhalations 1-2 times per day (MAX=4/day) For now increase Asmanex to two puffs twice a day until symptoms get better. Rinse mouth out after to help prevent thrush   2. Continue to Treat reflux:   A.  Generic Dexilant 60 mg 3-7 times per week. For now start taking Dexilant 60 mg once a day and then you can go back to 3-7 times per week  3.  If needed:   A.  Nystatin oral solution-5 mL after Asmanex - rinse and spit  B.  Proair HFA  C.  Antihistamine  D.  Nasal  saline  E.  Dextromethorphan containing cough suppressant  4. Return to clinic in 4-6 months or earlier if problem.            Return in about 6 months (around 05/31/2022), or if symptoms worsen or fail to improve.    Thank you for the opportunity to care for this patient.  Please do not hesitate to contact me with questions.  Althea Charon, FNP [facility]  I have provided oversight concerning  NP evaluation and treatment of this patient's health issues addressed during today's encounter. I agree with the assessment and therapeutic plan as outlined in the note.   Signed,   Jiles Prows, MD,  Allergy and Immunology,  [facility].

## 2021-12-13 DIAGNOSIS — Z79899 Other long term (current) drug therapy: Secondary | ICD-10-CM | POA: Diagnosis not present

## 2021-12-13 DIAGNOSIS — E1169 Type 2 diabetes mellitus with other specified complication: Secondary | ICD-10-CM | POA: Diagnosis not present

## 2021-12-13 DIAGNOSIS — Z6834 Body mass index (BMI) 34.0-34.9, adult: Secondary | ICD-10-CM | POA: Diagnosis not present

## 2021-12-13 DIAGNOSIS — I10 Essential (primary) hypertension: Secondary | ICD-10-CM | POA: Diagnosis not present

## 2021-12-13 DIAGNOSIS — M109 Gout, unspecified: Secondary | ICD-10-CM | POA: Diagnosis not present

## 2021-12-13 DIAGNOSIS — E785 Hyperlipidemia, unspecified: Secondary | ICD-10-CM | POA: Diagnosis not present

## 2021-12-17 DIAGNOSIS — E669 Obesity, unspecified: Secondary | ICD-10-CM | POA: Diagnosis not present

## 2021-12-17 DIAGNOSIS — J309 Allergic rhinitis, unspecified: Secondary | ICD-10-CM | POA: Diagnosis not present

## 2021-12-17 DIAGNOSIS — E559 Vitamin D deficiency, unspecified: Secondary | ICD-10-CM | POA: Diagnosis not present

## 2021-12-17 DIAGNOSIS — J45909 Unspecified asthma, uncomplicated: Secondary | ICD-10-CM | POA: Diagnosis not present

## 2021-12-17 DIAGNOSIS — E119 Type 2 diabetes mellitus without complications: Secondary | ICD-10-CM | POA: Diagnosis not present

## 2021-12-17 DIAGNOSIS — I700 Atherosclerosis of aorta: Secondary | ICD-10-CM | POA: Diagnosis not present

## 2021-12-17 DIAGNOSIS — K5900 Constipation, unspecified: Secondary | ICD-10-CM | POA: Diagnosis not present

## 2021-12-17 DIAGNOSIS — I10 Essential (primary) hypertension: Secondary | ICD-10-CM | POA: Diagnosis not present

## 2021-12-17 DIAGNOSIS — G8929 Other chronic pain: Secondary | ICD-10-CM | POA: Diagnosis not present

## 2021-12-17 DIAGNOSIS — J449 Chronic obstructive pulmonary disease, unspecified: Secondary | ICD-10-CM | POA: Diagnosis not present

## 2021-12-17 DIAGNOSIS — K219 Gastro-esophageal reflux disease without esophagitis: Secondary | ICD-10-CM | POA: Diagnosis not present

## 2021-12-17 DIAGNOSIS — F419 Anxiety disorder, unspecified: Secondary | ICD-10-CM | POA: Diagnosis not present

## 2021-12-20 DIAGNOSIS — M9902 Segmental and somatic dysfunction of thoracic region: Secondary | ICD-10-CM | POA: Diagnosis not present

## 2021-12-20 DIAGNOSIS — M9903 Segmental and somatic dysfunction of lumbar region: Secondary | ICD-10-CM | POA: Diagnosis not present

## 2021-12-20 DIAGNOSIS — M5388 Other specified dorsopathies, sacral and sacrococcygeal region: Secondary | ICD-10-CM | POA: Diagnosis not present

## 2021-12-20 DIAGNOSIS — M9904 Segmental and somatic dysfunction of sacral region: Secondary | ICD-10-CM | POA: Diagnosis not present

## 2021-12-20 DIAGNOSIS — M4726 Other spondylosis with radiculopathy, lumbar region: Secondary | ICD-10-CM | POA: Diagnosis not present

## 2021-12-20 DIAGNOSIS — M47894 Other spondylosis, thoracic region: Secondary | ICD-10-CM | POA: Diagnosis not present

## 2022-01-03 DIAGNOSIS — M109 Gout, unspecified: Secondary | ICD-10-CM | POA: Diagnosis not present

## 2022-01-03 DIAGNOSIS — F419 Anxiety disorder, unspecified: Secondary | ICD-10-CM | POA: Diagnosis not present

## 2022-01-03 DIAGNOSIS — Z1331 Encounter for screening for depression: Secondary | ICD-10-CM | POA: Diagnosis not present

## 2022-01-03 DIAGNOSIS — Z7189 Other specified counseling: Secondary | ICD-10-CM | POA: Diagnosis not present

## 2022-01-03 DIAGNOSIS — L03011 Cellulitis of right finger: Secondary | ICD-10-CM | POA: Diagnosis not present

## 2022-01-03 DIAGNOSIS — I700 Atherosclerosis of aorta: Secondary | ICD-10-CM | POA: Diagnosis not present

## 2022-01-03 DIAGNOSIS — Z6835 Body mass index (BMI) 35.0-35.9, adult: Secondary | ICD-10-CM | POA: Diagnosis not present

## 2022-01-03 DIAGNOSIS — Z0000 Encounter for general adult medical examination without abnormal findings: Secondary | ICD-10-CM | POA: Diagnosis not present

## 2022-01-03 DIAGNOSIS — E785 Hyperlipidemia, unspecified: Secondary | ICD-10-CM | POA: Diagnosis not present

## 2022-01-03 DIAGNOSIS — E1169 Type 2 diabetes mellitus with other specified complication: Secondary | ICD-10-CM | POA: Diagnosis not present

## 2022-01-03 DIAGNOSIS — I10 Essential (primary) hypertension: Secondary | ICD-10-CM | POA: Diagnosis not present

## 2022-01-09 DIAGNOSIS — I361 Nonrheumatic tricuspid (valve) insufficiency: Secondary | ICD-10-CM | POA: Diagnosis not present

## 2022-01-09 DIAGNOSIS — I517 Cardiomegaly: Secondary | ICD-10-CM | POA: Diagnosis not present

## 2022-01-14 DIAGNOSIS — M9903 Segmental and somatic dysfunction of lumbar region: Secondary | ICD-10-CM | POA: Diagnosis not present

## 2022-01-14 DIAGNOSIS — M4726 Other spondylosis with radiculopathy, lumbar region: Secondary | ICD-10-CM | POA: Diagnosis not present

## 2022-01-14 DIAGNOSIS — M9904 Segmental and somatic dysfunction of sacral region: Secondary | ICD-10-CM | POA: Diagnosis not present

## 2022-01-14 DIAGNOSIS — M9902 Segmental and somatic dysfunction of thoracic region: Secondary | ICD-10-CM | POA: Diagnosis not present

## 2022-01-14 DIAGNOSIS — M5388 Other specified dorsopathies, sacral and sacrococcygeal region: Secondary | ICD-10-CM | POA: Diagnosis not present

## 2022-01-14 DIAGNOSIS — M47894 Other spondylosis, thoracic region: Secondary | ICD-10-CM | POA: Diagnosis not present

## 2022-01-15 DIAGNOSIS — L720 Epidermal cyst: Secondary | ICD-10-CM | POA: Diagnosis not present

## 2022-01-15 DIAGNOSIS — D225 Melanocytic nevi of trunk: Secondary | ICD-10-CM | POA: Diagnosis not present

## 2022-01-15 DIAGNOSIS — L814 Other melanin hyperpigmentation: Secondary | ICD-10-CM | POA: Diagnosis not present

## 2022-01-15 DIAGNOSIS — L820 Inflamed seborrheic keratosis: Secondary | ICD-10-CM | POA: Diagnosis not present

## 2022-01-15 DIAGNOSIS — L0100 Impetigo, unspecified: Secondary | ICD-10-CM | POA: Diagnosis not present

## 2022-01-16 ENCOUNTER — Encounter: Payer: Self-pay | Admitting: Cardiovascular Disease

## 2022-01-16 ENCOUNTER — Ambulatory Visit: Payer: PPO | Attending: Cardiovascular Disease | Admitting: Cardiovascular Disease

## 2022-01-16 VITALS — BP 139/82 | HR 83 | Ht 62.0 in | Wt 183.6 lb

## 2022-01-16 DIAGNOSIS — R072 Precordial pain: Secondary | ICD-10-CM | POA: Diagnosis not present

## 2022-01-16 MED ORDER — METOPROLOL TARTRATE 100 MG PO TABS: 100.0000 mg | ORAL_TABLET | ORAL | 0 refills | Status: DC

## 2022-01-16 NOTE — Patient Instructions (Signed)
Medication Instructions:   NO CHANGES  You will need to take a one-time dose of metoprolol tartrate two hours before your CT test  *If you need a refill on your cardiac medications before your next appointment, please call your pharmacy*   Lab Work:  Non-Fasting BMET prior to CT test   Testing/Procedures:  Coronary CT at [place]: At [facility], you and your health needs are our priority.  As part of our continuing mission to provide you with exceptional heart care, we have created designated Provider Care Teams.  These Care Teams include your primary Cardiologist (physician) and Advanced Practice Providers (APPs -  Physician Assistants and Nurse Practitioners) who all work together to provide you with the care you need, when you need it.  We recommend signing up for the patient portal called "MyChart".  Sign up information is provided on this After Visit Summary.  MyChart is used to connect with patients for Virtual Visits (Telemedicine).  Patients are able to view lab/test results, encounter notes, upcoming appointments, etc.  Non-urgent messages can be sent to your provider as well.   To learn more about what you can do with MyChart, go to [email or web address].    Your next appointment:    AS NEEDED with Dr. Sallyanne Kuster  Other Instructions   Your cardiac CT will be scheduled at one of the below locations:   [facility] [address] [place], [place] [address] [phone]  [facility] [address] [place], [place] [address] [phone]  [facility] [place], [place] [address] [phone]  If scheduled at [facility], please arrive at the [facility] (Entrance C2) of [facility] 30 minutes prior to test start time. You can use the FREE valet parking offered at entrance C (encouraged to  control the heart rate for the test)  Proceed to the [facility] Radiology Department (first floor) to check-in and test prep.  All radiology patients and guests should use entrance C2 at [facility], accessed from [facility], even though the hospital's physical address listed is [address].    If scheduled at [facility] or [facility], please arrive 15 mins early for check-in and test prep.   Please follow these instructions carefully (unless otherwise directed):  Hold all erectile dysfunction medications at least 3 days (72 hrs) prior to test. (Ie viagra, cialis, sildenafil, tadalafil, etc) We will administer nitroglycerin during this exam.   On the Night Before the Test: Be sure to Drink plenty of water. Do not consume any caffeinated/decaffeinated beverages or chocolate 12 hours prior to your test. Do not take any antihistamines 12 hours prior to your test.  On the Day of the Test: Drink plenty of water until 1 hour prior to the test. Do not eat any food 1 hour prior to test. You may take your regular medications prior to the test.  Take metoprolol (Lopressor) two hours prior to test. HOLD Furosemide/Hydrochlorothiazide morning of the test. FEMALES- please wear underwire-free bra if available, avoid dresses & tight clothing  After the Test: Drink plenty of water. After receiving IV contrast, you may experience a mild flushed feeling. This is normal. On occasion, you may experience a mild rash up to 24 hours after the test. This is not dangerous. If this occurs, you can take Benadryl 25  mg and increase your fluid intake. If you experience trouble breathing, this can be serious. If it is severe call 911 IMMEDIATELY. If it is mild, please call our office. If you take any of these medications: Glipizide/Metformin, Avandament, Glucavance, please do not take 48 hours after completing test unless otherwise  instructed.  We will call to schedule your test 2-4 weeks out understanding that some insurance companies will need an authorization prior to the service being performed.   For non-scheduling related questions, please contact the cardiac imaging nurse navigator should you have any questions/concerns: Marchia Bond, Cardiac Imaging Nurse Navigator Gordy Clement, Cardiac Imaging Nurse Navigator [facility] Direct Office Dial: [phone]   For scheduling needs, including cancellations and rescheduling, please call [place], [phone].

## 2022-01-16 NOTE — Progress Notes (Signed)
Cardiology Office Note:    Date:  01/16/2022   ID:  Autumn Carter, DOB Sep 19, 1955, MRN [id number]  PCP:  Ernestene Kiel, MD   [facility] Providers Cardiologist:  None     Referring MD: Ernestene Kiel, MD   No chief complaint on file. Autumn Carter is a 66 y.o. female who is being seen today for the evaluation of cardiomegaly and coronary calcifications at the request of Ernestene Kiel, MD.   History of Present Illness:    Autumn Carter is a 66 y.o. female with a hx of left breast lumpectomy (2010) followed by radiation therapy (which the patient reports she completed in 2015), history of GERD and irritable bowel syndrome, type 2 diabetes mellitus, hypertension, nephrolithiasis, recently identified as having coronary artery calcifications, aortic atherosclerosis and cardiomegaly on chest CT geography performed 12/24/2021 complaints of chest pain and cough following a COVID-19 infection.  The images cannot be reviewed, but the report is available.  She describes herself as having constant chest pain and tenderness to palpation overlying her left upper chest parasternal area.  She reports it has been there ever since she received radiation therapy.  Activity is limited by "tenderness in her feet" and a possible diagnosis of gout.  She is okay blowing leaves in the driveway, but walking at a fast pace is challenging.  She does not have chest pain with walking.  She denies coughing or wheezing recently, although she did take bronchodilators following her respiratory viral infection for a while.  Denies orthopnea, PND and lower extremity edema and does not have claudication.  The pain is exclusively in the toes and soles of her feet.    She has an extremely lengthy list of medication intolerances.  Some of the information is contradictory.  For example she has a history of tongue and lip swelling with olmesartan, also reported with lisinopril, but takes valsartan 320 mg daily  without any difficulties.  Some of the side effects are unexpected: For example spironolactone worsens GERD and amlodipine causes abdominal pain, beta-blockers are associated with eye infections and hydrochlorothiazide caused kidney swelling.  She has a history of headache with nifedipine, although there is no mention of side effects with other calcium channel blockers.  Doxazosin caused "pain".  In addition to numerous intolerances to hypertension medications, there is a long list of other medications associated with a variety of side effects.  For example, higher dose prednisone caused worsening GERD.  Pulmicort caused body aches all over.  Currently her blood pressure is managed with a clonidine patch and valsartan 320 mg daily.  She has a prescription for rosuvastatin but is not taking it due to side effects.  She does not remember what the side effects were.  She has never smoked cigarettes.  She has very bad GERD and frequent intestinal problems.  She reports having whitecoat hypertension.  Her blood pressure today was 140/96 when she first checked in, 139/82 when I rechecked it.  Past Medical History:  Diagnosis Date   Allergic rhinitis    Anxiety    on meds   Arthritis    generalized   Asthma    uses inhaler/inhaler PRN   Breast cancer ([place]) 2010   LEFT   Cataract 2021   bilateral sx   Chronic kidney disease    hx of kidney stones   Dry eye    Family history of colon cancer    Family history of colonic polyps    Fatty liver  Gastric polyp    GERD (gastroesophageal reflux disease)    on meds   Gout    History of breast cancer    History of colon polyps    History of kidney stones    History of shingles    Hypertension    on meds   IBS (irritable bowel syndrome)    with constipation and diarrhea   Internal hemorrhage    Migraine    Pneumonia    PUD (peptic ulcer disease)    Rosacea    Seasonal allergies    UTI (urinary tract infection)     Past Surgical History:   Procedure Laterality Date   ABDOMINAL HYSTERECTOMY     BREAST LUMPECTOMY Left 01/30/2009   And radiotherapy   CATARACT EXTRACTION     CHOLECYSTECTOMY     COLONOSCOPY  2012   Gupta-Diverticulosis of colon. Internal hemorrhoids.   ESOPHAGOGASTRODUODENOSCOPY  05/10/2013   Small hiatal hernia. Incidental gastric polyps (status post polypectomy x3). Mild gastritis   KNEE SURGERY Right    s/p arthroscopic surgery   LAPAROSCOPIC HYSTERECTOMY  12/04/2009   Complete with bladder tack   NOSE SURGERY  1976   WISDOM TOOTH EXTRACTION      Current Medications: Current Meds  Medication Sig   allopurinol (ZYLOPRIM) 100 MG tablet Take 100 mg by mouth daily.   ASMANEX 60 METERED DOSES 220 MCG/INH inhaler Inhale 2 puffs into the lungs at bedtime.   Blood Glucose Monitoring Suppl (ONETOUCH VERIO REFLECT) w/Device KIT CHECK BLOOD SUGAR IN VITRO ONCE A DAY   Calcium Carbonate (CALCIUM 500 PO) Take by mouth 2 (two) times daily.   cloNIDine (CATAPRES - DOSED IN MG/24 HR) 0.2 mg/24hr patch APPLY 1 PATCH TO SKIN ONCE PER WEEK TRANSDERMAL 90 DAYS   D-Mannose 500 MG CAPS Take by mouth every other day.   dexlansoprazole (DEXILANT) 60 MG capsule Take 60 mg by mouth every other day.   Diclofenac Sodium (VOLTAREN EX) Apply topically as needed.   glimepiride (AMARYL) 1 MG tablet Take 1 mg by mouth every morning.   Lancets (ONETOUCH DELICA PLUS [id number]) MISC CHECK BLOOD SUGAR ONCE A DAY   levalbuterol (XOPENEX) 1.25 MG/3ML nebulizer solution Take 1.25 mg by nebulization every 8 (eight) hours as needed.   metoprolol tartrate (LOPRESSOR) 100 MG tablet Take 1 tablet (100 mg total) by mouth as directed. Take TWO HOURS prior to CT test   montelukast (SINGULAIR) 10 MG tablet Take 10 mg by mouth at bedtime.   Nutritional Supplements (JUICE PLUS FIBRE PO) Take 3 capsules by mouth daily at 6 (six) AM.   Nutritional Supplements (NUTRITIONAL SUPPLEMENT PO) Take by mouth at bedtime. Magnical-D vitamins   ONETOUCH VERIO  test strip daily.   Probiotic Product (ALIGN PO) Take 1 tablet by mouth daily.   triamcinolone (NASACORT ALLERGY 24HR) 55 MCG/ACT AERO nasal inhaler Place 1 spray into the nose 2 (two) times daily.   triamcinolone cream (KENALOG) 0.1 % Apply 1 Application topically as needed.   valsartan (DIOVAN) 320 MG tablet Take 320 mg by mouth daily.   Vitamin D, Cholecalciferol, 1000 units TABS Take by mouth daily.     Allergies:   Zestril [lisinopril], Ace inhibitors, Amlodipine, Amoxicillin, Arnuity ellipta [fluticasone furoate], Beta adrenergic blockers, Cephalexin, Cephalosporins, Clindamycin/lincomycin, Clotrimazole, Codeine sulfate, Covid-19 ad26 vaccine(janssen), Doxazosin, Flovent hfa [fluticasone], Hydralazine hcl, Hydrochlorothiazide, Lansoprazole, Levaquin [levofloxacin in d5w], Macrobid [nitrofurantoin monohyd macro], Macrobid [nitrofurantoin], Metoprolol tartrate, Montelukast sodium, Morphine and related, Moxifloxacin hcl, Naltrexone, Nasonex [mometasone furoate], Nifedipine, Olmesartan, Olmesartan  medoxomil-hctz, Prednisone, Propoxyphene, Pulmicort [budesonide], Qvar [beclomethasone], Ranitidine, Silvadene [silver sulfadiazine], Spironolactone, Sulfa antibiotics, Tobramycin, Voltaren [diclofenac], and Zofran [ondansetron]   Social History   Socioeconomic History   Marital status: Married    Spouse name: Not on file   Number of children: Not on file   Years of education: Not on file   Highest education level: Not on file  Occupational History   Not on file  Tobacco Use   Smoking status: Never   Smokeless tobacco: Never  Vaping Use   Vaping Use: Never used  Substance and Sexual Activity   Alcohol use: No   Drug use: No   Sexual activity: Not on file  Other Topics Concern   Not on file  Social History Narrative   Not on file   Social Determinants of Health   Financial Resource Strain: Not on file  Food Insecurity: Not on file  Transportation Needs: Not on file  Physical  Activity: Not on file  Stress: Not on file  Social Connections: Not on file     Family History: The patient's family history includes Breast cancer in her cousin, maternal aunt, and paternal aunt; Colon cancer (age of onset: 21) in her father; Colon polyps in her mother and sister; Colon polyps (age of onset: 46) in her father; Diverticulitis in her mother; Hypertension in her father, mother, and sister; Other in her sister. There is no history of Esophageal cancer, Rectal cancer, or Stomach cancer.  ROS:   Please see the history of present illness.     All other systems reviewed and are negative.  EKGs/Labs/Other Studies Reviewed:    The following studies were reviewed today: Echocardiogram report dated 01/09/2022 from [place] Left ventricular chamber size is normal.  Left ventricular diastolic function is impaired laxation pattern.  The left ventricular ejection fraction is 55-60%.  (There is no mention of any wall motion abnormalities).  There is no evidence of any meaningful valvular abnormalities and the estimated systolic PA pressure was 25 mmHg.  The aortic root and ascending aorta were described as poorly visualized.  The pericardium appears normal.  EKG:  EKG is  ordered today.  The ekg ordered today demonstrates this rhythm, right bundle branch block.  There are no ischemic repolarization abnormalities.  Recent Labs: No results found for requested labs within last 365 days.  Recent Lipid Panel No results found for: "CHOL", "TRIG", "HDL", "CHOLHDL", "VLDL", "LDLCALC", "LDLDIRECT" 12/10/2018 Cholesterol 184, HDL 43, LDL 120, triglycerides 118 02/27/2021 Hemoglobin A1c 6.6%, hemoglobin 15.8, creatinine 0.72, ALT 23   Risk Assessment/Calculations:                Physical Exam:    VS:  BP 139/82   Pulse 83   Ht _0  (1.575 m)   Wt 83.3 kg   BMI 33.58 kg/m     Wt Readings from Last 3 Encounters:  01/16/22 83.3 kg  11/29/21 83.8 kg  05/24/21 86.6 kg      GEN: Moderately obese well nourished, well developed in no acute distress HEENT: Normal NECK: No JVD; No carotid bruits LYMPHATICS: No lymphadenopathy CARDIAC: Normal S1, widely split S2 RRR, no murmurs, rubs, gallops RESPIRATORY:  Clear to auscultation without rales, wheezing or rhonchi  ABDOMEN: Soft, non-tender, non-distended MUSCULOSKELETAL:  No edema; No deformity  SKIN: Warm and dry NEUROLOGIC:  Alert and oriented x 3 PSYCHIATRIC:  Normal affect   ASSESSMENT:    1. Precordial pain    PLAN:    In  order of problems listed above:  Cardiomegaly:: This was reported on chest CT, but is not confirmed by echocardiography.  All of her heart chambers are described as being normal in size and there is no evidence of chamber hypertrophy or pericardial effusion.  I am unable to review the images. Coronary calcification/aortic atherosclerosis: Does not have angina pectoris, ischemic ECG changes or abnormalities in caliber of the thoracic aorta.  It is important to exclude coronary artery stenoses in this patient with increased risk of development of CAD due to the presence of type 2 diabetes mellitus, mild dyslipidemia, hypertension and a history of previous chest radiation therapy which could accelerate the development of atherosclerosis.  Will schedule for coronary CT angiography.  Even though metoprolol is listed as a side effect, it was not a true allergic reaction.  I suspect she will tolerate 1 dose of metoprolol in order to get a good-quality CT angiogram even if she did not tolerate this medication long-term.  Has a history of anxiety and has had to take diazepam in the past in order to undergo imaging studies.  Can take her usual dose of diazepam an hour or so before the planned CT. DM: Well-controlled.  Target LDL cholesterol less than 100 since she has diabetes mellitus.  Currently not taking any lipid-lowering agents and had a on specified side effect with the rosuvastatin. HTN:  Numerous medication intolerances.  Current blood pressure control is adequate with ARB plus clonidine patch.  Fact that she tolerates valsartan without side effects makes it rather doubtful that she had true angioedema with olmesartan in the past.           Medication Adjustments/Labs and Tests Ordered: Current medicines are reviewed at length with the patient today.  Concerns regarding medicines are outlined above.  Orders Placed This Encounter  Procedures   CT CORONARY MORPH W/CTA COR W/SCORE W/CA W/CM &/OR WO/CM   Basic metabolic panel   EKG 96-VELF   Meds ordered this encounter  Medications   metoprolol tartrate (LOPRESSOR) 100 MG tablet    Sig: Take 1 tablet (100 mg total) by mouth as directed. Take TWO HOURS prior to CT test    Dispense:  1 tablet    Refill:  0    Patient Instructions  Medication Instructions:   NO CHANGES  You will need to take a one-time dose of metoprolol tartrate two hours before your CT test  *If you need a refill on your cardiac medications before your next appointment, please call your pharmacy*   Lab Work:  Non-Fasting BMET prior to CT test   Testing/Procedures:  Coronary CT at [place]: At [facility], you and your health needs are our priority.  As part of our continuing mission to provide you with exceptional heart care, we have created designated Provider Care Teams.  These Care Teams include your primary Cardiologist (physician) and Advanced Practice Providers (APPs -  Physician Assistants and Nurse Practitioners) who all work together to provide you with the care you need, when you need it.  We recommend signing up for the patient portal called "MyChart".  Sign up information is provided on this After Visit Summary.  MyChart is used to connect with patients for Virtual Visits (Telemedicine).  Patients are able to view lab/test results, encounter notes, upcoming appointments, etc.  Non-urgent messages can be  sent to your provider as well.   To learn more about what you can do with MyChart, go to [email or web address].  Your next appointment:    AS NEEDED with Dr. Sallyanne Kuster  Other Instructions   Your cardiac CT will be scheduled at one of the below locations:   [facility] [address] [place], [place] [address] [phone]  [place] [address] [place], [place] [address] [phone]  [facility] [place],  [address] [phone]  If scheduled at [facility], please arrive at the [facility] (Entrance C2) of [facility] 30 minutes prior to test start time. You can use the FREE valet parking offered at entrance C (encouraged to control the heart rate for the test)  Proceed to the [facility] Radiology Department (first floor) to check-in and test prep.  All radiology patients and guests should use entrance C2 at [facility], accessed from [facility], even though the hospital's physical address listed is [address]    If scheduled at [facility] or [facility] please arrive 15 mins early for check-in and test prep.   Please follow these instructions carefully (unless otherwise directed):  Hold all erectile dysfunction medications at least 3 days (72 hrs) prior to test. (Ie viagra, cialis, sildenafil, tadalafil, etc) We will administer nitroglycerin during this exam.   On the Night Before the Test: Be sure to Drink plenty of water. Do not consume any caffeinated/decaffeinated beverages or chocolate 12 hours prior to your test. Do not take any antihistamines 12 hours prior to your test.  On the Day of the Test: Drink plenty of water until 1 hour prior to the test. Do not eat any food 1 hour prior to test. You may take your regular  medications prior to the test.  Take metoprolol (Lopressor) two hours prior to test. HOLD Furosemide/Hydrochlorothiazide morning of the test. FEMALES- please wear underwire-free bra if available, avoid dresses & tight clothing  After the Test: Drink plenty of water. After receiving IV contrast, you may experience a mild flushed feeling. This is normal. On occasion, you may experience a mild rash up to 24 hours after the test. This is not dangerous. If this occurs, you can take Benadryl 25 mg and increase your fluid intake. If you experience trouble breathing, this can be serious. If it is severe call 911 IMMEDIATELY. If it is mild, please call our office. If you take any of these medications: Glipizide/Metformin, Avandament, Glucavance, please do not take 48 hours after completing test unless otherwise instructed.  We will call to schedule your test 2-4 weeks out understanding that some insurance companies will need an authorization prior to the service being performed.   For non-scheduling related questions, please contact the cardiac imaging nurse navigator should you have any questions/concerns: Marchia Bond, Cardiac Imaging Nurse Navigator Gordy Clement, Cardiac Imaging Nurse Navigator [facility] Direct Office Dial: [phone]   For scheduling needs, including cancellations and rescheduling, please call [place], [phone].          Signed, Sanda Klein, MD  01/16/2022 1:14 PM    [place]

## 2022-01-17 LAB — BASIC METABOLIC PANEL
BUN/Creatinine Ratio: 15 (ref 12–28)
BUN: 11 mg/dL (ref 8–27)
CO2: 22 mmol/L (ref 20–29)
Calcium: 10.1 mg/dL (ref 8.7–10.3)
Chloride: 104 mmol/L (ref 96–106)
Creatinine, Ser: 0.72 mg/dL (ref 0.57–1.00)
Glucose: 162 mg/dL — ABNORMAL HIGH (ref 70–99)
Potassium: 4.2 mmol/L (ref 3.5–5.2)
Sodium: 139 mmol/L (ref 134–144)
eGFR: 91 mL/min/{1.73_m2} (ref >59)

## 2022-01-22 ENCOUNTER — Encounter: Payer: Self-pay | Admitting: Allergy

## 2022-01-22 ENCOUNTER — Ambulatory Visit: Payer: PPO | Admitting: Allergy

## 2022-01-22 VITALS — BP 150/86 | HR 96 | Temp 98.1°F | Resp 18

## 2022-01-22 DIAGNOSIS — K219 Gastro-esophageal reflux disease without esophagitis: Secondary | ICD-10-CM | POA: Diagnosis not present

## 2022-01-22 DIAGNOSIS — J3089 Other allergic rhinitis: Secondary | ICD-10-CM

## 2022-01-22 DIAGNOSIS — J4489 Other specified chronic obstructive pulmonary disease: Secondary | ICD-10-CM

## 2022-01-22 DIAGNOSIS — J069 Acute upper respiratory infection, unspecified: Secondary | ICD-10-CM

## 2022-01-22 MED ORDER — METHYLPREDNISOLONE ACETATE 40 MG/ML IJ SUSP
40.0000 mg | Freq: Once | INTRAMUSCULAR | Status: AC
  Administered 2022-01-22: 40 mg via INTRAMUSCULAR

## 2022-01-22 NOTE — Patient Instructions (Signed)
  1. Continue to Treat inflammation:   A. Asmanex 220 - 1-2 inhalations 1-2 times per day based on symptom severity.  For now increase Asmanex to two puffs twice a day until symptoms get better. Rinse mouth out after to help prevent thrush   2. Continue to Treat reflux:   A.  Generic Dexilant 60 mg 3-7 times per week.   3.  For current viral respiratory illness given Depomedrol low dose injection in office today for symptom improvement  4.  If needed:   A.  Proair HFA  B.  Antihistamine  C.  Nasal saline  D.  Dextromethorphan containing cough suppressant  5. Return to clinic in 4-6 months or earlier if problem.

## 2022-01-22 NOTE — Progress Notes (Signed)
Follow-up Note  RE: LATEISHA THURLOW MRN: [id number] DOB: 10-19-1955 Date of Office Visit: 01/22/2022   History of present illness: Autumn Carter is a 66 y.o. female presenting today for sick visit.  She was last seen in the office on 11/29/21 by our nurse practitioner Quita Skye.  She has history of COPD with asthma, LPRD and allergic rhinitis.  She states her grandson has been sick and she has caught his symptoms.  She states last Thursday she had left ear pain and developed throat pain and itching, nasal drainage, cough, low grade fever of 100, achiness.  She states her grandson has not required any antibiotics.  They have not taken any tests.  They are set to travel to the beach for the holiday soon.  She currently takes asmanex 268mg 1 puff once a day typically but states she feels like she likely should increased to the 2 puffs twice a day at this time.  She continues to take dexilant for reflux control.  She states a lot of medications irritate her stomach and aggravate her reflux including prednisone.  She states injections she tolerates better.    Review of systems: Review of Systems  Constitutional: Negative.        See HPI  HENT: Negative.         See HPI  Eyes: Negative.   Respiratory: Negative.         See HPI  Cardiovascular: Negative.   Gastrointestinal: Negative.   Musculoskeletal: Negative.        See HPI  Skin: Negative.   Allergic/Immunologic: Negative.   Neurological: Negative.      All other systems negative unless noted above in HPI  Past medical/social/surgical/family history have been reviewed and are unchanged unless specifically indicated below.  No changes  Medication List: Current Outpatient Medications  Medication Sig Dispense Refill   albuterol (VENTOLIN HFA) 108 (90 Base) MCG/ACT inhaler Inhale 2 puffs every 4-6 hours as needed for cough, wheeze, tightness in chest, or shortness of breath 8 g 1   allopurinol (ZYLOPRIM) 100 MG tablet Take 100 mg by mouth  daily.     ASMANEX 60 METERED DOSES 220 MCG/INH inhaler Inhale 2 puffs into the lungs at bedtime.  2   Blood Glucose Monitoring Suppl (ONETOUCH VERIO REFLECT) w/Device KIT CHECK BLOOD SUGAR IN VITRO ONCE A DAY     Calcium Carbonate (CALCIUM 500 PO) Take by mouth 2 (two) times daily.     cloNIDine (CATAPRES - DOSED IN MG/24 HR) 0.2 mg/24hr patch APPLY 1 PATCH TO SKIN ONCE PER WEEK TRANSDERMAL 90 DAYS  1   D-Mannose 500 MG CAPS Take by mouth every other day.     dexlansoprazole (DEXILANT) 60 MG capsule Take 60 mg by mouth every other day.     diazepam (VALIUM) 5 MG tablet Take 2.5-5 mg by mouth 2 (two) times daily as needed for anxiety (sleep).     Diclofenac Sodium (VOLTAREN EX) Apply topically as needed.     glimepiride (AMARYL) 1 MG tablet Take 1 mg by mouth every morning.     Lancets (ONETOUCH DELICA PLUS [id number] MISC CHECK BLOOD SUGAR ONCE A DAY     levalbuterol (XOPENEX) 1.25 MG/3ML nebulizer solution Take 1.25 mg by nebulization every 8 (eight) hours as needed.     metoprolol tartrate (LOPRESSOR) 100 MG tablet Take 1 tablet (100 mg total) by mouth as directed. Take TWO HOURS prior to CT test 1 tablet 0   montelukast (SINGULAIR) 10  MG tablet Take 10 mg by mouth at bedtime.     Nutritional Supplements (JUICE PLUS FIBRE PO) Take 3 capsules by mouth daily at 6 (six) AM.     Nutritional Supplements (NUTRITIONAL SUPPLEMENT PO) Take by mouth at bedtime. Magnical-D vitamins     nystatin (MYCOSTATIN) 100000 UNIT/ML suspension Swish and swallow 72m daily as directed 450 mL 1   ondansetron (ZOFRAN-ODT) 4 MG disintegrating tablet Take 4 mg by mouth every 8 (eight) hours as needed for nausea.     ONETOUCH VERIO test strip daily.     Probiotic Product (ALIGN PO) Take 1 tablet by mouth daily.     rosuvastatin (CRESTOR) 5 MG tablet Take 5 mg by mouth once a week.     tiZANidine (ZANAFLEX) 2 MG tablet Take 2 mg by mouth every 8 (eight) hours as needed for muscle spasms.     traMADol (ULTRAM) 50 MG  tablet   0   triamcinolone (NASACORT ALLERGY 24HR) 55 MCG/ACT AERO nasal inhaler Place 1 spray into the nose 2 (two) times daily.     triamcinolone cream (KENALOG) 0.1 % Apply 1 Application topically as needed.     valsartan (DIOVAN) 320 MG tablet Take 320 mg by mouth daily.     Vitamin D, Cholecalciferol, 1000 units TABS Take by mouth daily.     No current facility-administered medications for this visit.     Known medication allergies: Allergies  Allergen Reactions   Zestril [Lisinopril] Anaphylaxis and Other (See Comments)    Heart attack symptoms   Ace Inhibitors Swelling   Amlodipine Other (See Comments)    Abdominal pain.   Amoxicillin    Arnuity Ellipta [Fluticasone Furoate] Other (See Comments)    Eye issues   Beta Adrenergic Blockers     Eye Infections.    Cephalexin    Cephalosporins Other (See Comments)    Unknown   Clindamycin/Lincomycin    Clotrimazole     Regurgitation    Codeine Sulfate    Covid-19 Ad26 Vaccine(Janssen)    Doxazosin Other (See Comments)    pain   Flovent Hfa [Fluticasone]     Heartburn   Hydralazine Hcl    Hydrochlorothiazide     Kidney swelling   Lansoprazole    Levaquin [Levofloxacin In D5w]    Macrobid [Nitrofurantoin Monohyd Macro]    Macrobid [Nitrofurantoin]     Cough   Metoprolol Tartrate    Montelukast Sodium Other (See Comments)    heartburn Other reaction(s): GI Upset (intolerance) heartburn   Morphine And Related Other (See Comments)    hallucinations     Moxifloxacin Hcl     Pain in arms and right wrist   Naltrexone Nausea Only   Nasonex [Mometasone Furoate]    Nifedipine Other (See Comments)    Headache    Olmesartan     Olmesartan Medoxomil: trembling, stomach upset   Olmesartan Medoxomil-Hctz Swelling    Tongue and Lip swelling Other reaction(s): Other (See Comments) Tongue and Lip swelling Unknown   Prednisone     High dose = Increase in GERD.   Propoxyphene Other (See Comments)     Unknown Unknown Unknown   Pulmicort [Budesonide]     Body ached all over---took over the counter:  Equate nasal spray form of this medication per patient '   Qvar [Beclomethasone] Nausea Only and Other (See Comments)    Runny nose   Ranitidine Itching and Swelling   Silvadene [Silver Sulfadiazine]     Didn't feel well  Spironolactone Other (See Comments)    Worsened GERD   Sulfa Antibiotics    Tobramycin     Eye redness, cheek redness, breast mucus leakage Other reaction(s): Flushing (ALLERGY/intolerance) Eye redness, cheek redness, breast mucus leakage   Voltaren [Diclofenac] Swelling and Other (See Comments)    Asthma   Zofran [Ondansetron]     Constipation     Physical examination: Blood pressure (!) 150/86, pulse 96, temperature 98.1 F (36.7 C), temperature source Temporal, resp. rate 18, SpO2 98 %.  General: Alert, interactive, in no acute distress. HEENT: PERRLA, TMs pearly gray, turbinates moderately edematous without discharge, post-pharynx non erythematous. Neck: Supple without lymphadenopathy. Lungs: Clear to auscultation without wheezing, rhonchi or rales. {no increased work of breathing. CV: Normal S1, S2 without murmurs. Abdomen: Nondistended, nontender. Skin: Warm and dry, without lesions or rashes. Extremities:  No clubbing, cyanosis or edema. Neuro:   Grossly intact.  Diagnositics/Labs: None today  Assessment and plan: Viral Upper Respiratory illness COPD with asthma   LPRD (laryngopharyngeal reflux disease)   Other allergic rhinitis      1. Continue to Treat inflammation:   A. Asmanex 220 - 1-2 inhalations 1-2 times per day based on symptom severity.  For now increase Asmanex to two puffs twice a day until symptoms get better. Rinse mouth out after to help prevent thrush   2. Continue to Treat reflux:   A.  Generic Dexilant 60 mg 3-7 times per week.   3.  For current viral respiratory illness given Depomedrol low dose injection in office  today for symptom improvement.   At this time symptoms and exam not consistent with bacterial illness to warrant antibiotics  4.  If needed:   A.  Proair HFA  B.  Antihistamine  C.  Nasal saline  D.  Dextromethorphan containing cough suppressant  5. Return to clinic in 4-6 months or earlier if problem.    I appreciate the opportunity to take part in Ayesha's care. Please do not hesitate to contact me with questions.  Sincerely,   Prudy Feeler, MD Allergy/Immunology [facility]

## 2022-01-23 DIAGNOSIS — R310 Gross hematuria: Secondary | ICD-10-CM | POA: Diagnosis not present

## 2022-01-23 DIAGNOSIS — N390 Urinary tract infection, site not specified: Secondary | ICD-10-CM | POA: Diagnosis not present

## 2022-02-01 ENCOUNTER — Telehealth (HOSPITAL_COMMUNITY): Payer: Self-pay | Admitting: Emergency Medicine

## 2022-02-01 NOTE — Telephone Encounter (Signed)
Attempted to call patient regarding upcoming cardiac CT appointment. °Left message on voicemail with name and callback number °[name] RN Navigator Cardiac Imaging °[facility] °[phone] Office °[phone] Cell ° °

## 2022-02-05 ENCOUNTER — Telehealth (HOSPITAL_COMMUNITY): Payer: Self-pay | Admitting: Emergency Medicine

## 2022-02-05 NOTE — Telephone Encounter (Signed)
Reaching out to patient to offer assistance regarding upcoming cardiac imaging study; pt verbalizes understanding of appt date/time, parking situation and where to check in, pre-test NPO status and medications ordered, and verified current allergies; name and call back number provided for further questions should they arise Autumn Bond RN Navigator Cardiac Imaging [facility] [phone] office [phone] cell  Arrive 800 WC entrance '100mg'$  metoprolol tartrate 2 hr prior RIGHT ARM ONLY IV/BP Needs wash cloth over eyes for claustro

## 2022-02-06 ENCOUNTER — Other Ambulatory Visit: Payer: Self-pay

## 2022-02-06 ENCOUNTER — Ambulatory Visit (HOSPITAL_COMMUNITY)
Admission: RE | Admit: 2022-02-06 | Discharge: 2022-02-06 | Disposition: A | Discharge status: Home / Self Care | Payer: PPO | Source: Ambulatory Visit | Attending: Cardiovascular Disease | Admitting: Cardiovascular Disease

## 2022-02-06 ENCOUNTER — Encounter (HOSPITAL_COMMUNITY): Payer: Self-pay | Admitting: Radiology

## 2022-02-06 DIAGNOSIS — R072 Precordial pain: Secondary | ICD-10-CM | POA: Insufficient documentation

## 2022-02-06 MED ORDER — NITROGLYCERIN 0.4 MG SL SUBL
SUBLINGUAL_TABLET | SUBLINGUAL | Status: AC
  Filled 2022-02-06: qty 2

## 2022-02-06 MED ORDER — IOHEXOL 350 MG/ML SOLN
95.0000 mL | Freq: Once | INTRAVENOUS | Status: AC | PRN
  Administered 2022-02-06: 95 mL via INTRAVENOUS

## 2022-02-06 MED ORDER — NITROGLYCERIN 0.4 MG SL SUBL
0.8000 mg | SUBLINGUAL_TABLET | Freq: Once | SUBLINGUAL | Status: AC
  Administered 2022-02-06: 0.8 mg via SUBLINGUAL

## 2022-02-06 NOTE — Progress Notes (Signed)
Got it!     Thanks =).

## 2022-02-18 DIAGNOSIS — M4303 Spondylolysis, cervicothoracic region: Secondary | ICD-10-CM | POA: Diagnosis not present

## 2022-02-18 DIAGNOSIS — M4307 Spondylolysis, lumbosacral region: Secondary | ICD-10-CM | POA: Diagnosis not present

## 2022-02-18 DIAGNOSIS — M9901 Segmental and somatic dysfunction of cervical region: Secondary | ICD-10-CM | POA: Diagnosis not present

## 2022-02-18 DIAGNOSIS — M4302 Spondylolysis, cervical region: Secondary | ICD-10-CM | POA: Diagnosis not present

## 2022-02-18 DIAGNOSIS — M9902 Segmental and somatic dysfunction of thoracic region: Secondary | ICD-10-CM | POA: Diagnosis not present

## 2022-02-18 DIAGNOSIS — M9903 Segmental and somatic dysfunction of lumbar region: Secondary | ICD-10-CM | POA: Diagnosis not present

## 2022-03-20 DIAGNOSIS — M9901 Segmental and somatic dysfunction of cervical region: Secondary | ICD-10-CM | POA: Diagnosis not present

## 2022-03-20 DIAGNOSIS — M4723 Other spondylosis with radiculopathy, cervicothoracic region: Secondary | ICD-10-CM | POA: Diagnosis not present

## 2022-03-20 DIAGNOSIS — M9904 Segmental and somatic dysfunction of sacral region: Secondary | ICD-10-CM | POA: Diagnosis not present

## 2022-03-20 DIAGNOSIS — M9902 Segmental and somatic dysfunction of thoracic region: Secondary | ICD-10-CM | POA: Diagnosis not present

## 2022-03-20 DIAGNOSIS — M5388 Other specified dorsopathies, sacral and sacrococcygeal region: Secondary | ICD-10-CM | POA: Diagnosis not present

## 2022-03-20 DIAGNOSIS — M4722 Other spondylosis with radiculopathy, cervical region: Secondary | ICD-10-CM | POA: Diagnosis not present

## 2022-04-04 DIAGNOSIS — E1169 Type 2 diabetes mellitus with other specified complication: Secondary | ICD-10-CM | POA: Diagnosis not present

## 2022-04-04 DIAGNOSIS — Z6835 Body mass index (BMI) 35.0-35.9, adult: Secondary | ICD-10-CM | POA: Diagnosis not present

## 2022-04-04 DIAGNOSIS — I10 Essential (primary) hypertension: Secondary | ICD-10-CM | POA: Diagnosis not present

## 2022-04-04 DIAGNOSIS — E785 Hyperlipidemia, unspecified: Secondary | ICD-10-CM | POA: Diagnosis not present

## 2022-04-04 DIAGNOSIS — Z79899 Other long term (current) drug therapy: Secondary | ICD-10-CM | POA: Diagnosis not present

## 2022-04-18 DIAGNOSIS — M5388 Other specified dorsopathies, sacral and sacrococcygeal region: Secondary | ICD-10-CM | POA: Diagnosis not present

## 2022-04-18 DIAGNOSIS — M9902 Segmental and somatic dysfunction of thoracic region: Secondary | ICD-10-CM | POA: Diagnosis not present

## 2022-04-18 DIAGNOSIS — M9904 Segmental and somatic dysfunction of sacral region: Secondary | ICD-10-CM | POA: Diagnosis not present

## 2022-04-18 DIAGNOSIS — M9901 Segmental and somatic dysfunction of cervical region: Secondary | ICD-10-CM | POA: Diagnosis not present

## 2022-04-18 DIAGNOSIS — M4723 Other spondylosis with radiculopathy, cervicothoracic region: Secondary | ICD-10-CM | POA: Diagnosis not present

## 2022-04-18 DIAGNOSIS — M4722 Other spondylosis with radiculopathy, cervical region: Secondary | ICD-10-CM | POA: Diagnosis not present

## 2022-05-02 DIAGNOSIS — M109 Gout, unspecified: Secondary | ICD-10-CM | POA: Diagnosis not present

## 2022-05-02 DIAGNOSIS — Z6834 Body mass index (BMI) 34.0-34.9, adult: Secondary | ICD-10-CM | POA: Diagnosis not present

## 2022-05-02 DIAGNOSIS — R1012 Left upper quadrant pain: Secondary | ICD-10-CM | POA: Diagnosis not present

## 2022-05-02 DIAGNOSIS — E785 Hyperlipidemia, unspecified: Secondary | ICD-10-CM | POA: Diagnosis not present

## 2022-05-09 DIAGNOSIS — R310 Gross hematuria: Secondary | ICD-10-CM | POA: Diagnosis not present

## 2022-05-09 DIAGNOSIS — E785 Hyperlipidemia, unspecified: Secondary | ICD-10-CM | POA: Diagnosis not present

## 2022-05-09 DIAGNOSIS — N390 Urinary tract infection, site not specified: Secondary | ICD-10-CM | POA: Diagnosis not present

## 2022-05-09 DIAGNOSIS — R1012 Left upper quadrant pain: Secondary | ICD-10-CM | POA: Diagnosis not present

## 2022-05-09 DIAGNOSIS — Z79899 Other long term (current) drug therapy: Secondary | ICD-10-CM | POA: Diagnosis not present

## 2022-05-10 LAB — LAB REPORT - SCANNED
A1c: 7
Creatinine, POC: 100.3 mg/dL
EGFR (Non-African Amer.): 90
Microalb Creat Ratio: 13
Microalb, Ur: 13.1

## 2022-05-16 DIAGNOSIS — M9902 Segmental and somatic dysfunction of thoracic region: Secondary | ICD-10-CM | POA: Diagnosis not present

## 2022-05-16 DIAGNOSIS — M9903 Segmental and somatic dysfunction of lumbar region: Secondary | ICD-10-CM | POA: Diagnosis not present

## 2022-05-16 DIAGNOSIS — M47892 Other spondylosis, cervical region: Secondary | ICD-10-CM | POA: Diagnosis not present

## 2022-05-16 DIAGNOSIS — M9901 Segmental and somatic dysfunction of cervical region: Secondary | ICD-10-CM | POA: Diagnosis not present

## 2022-05-16 DIAGNOSIS — M47817 Spondylosis without myelopathy or radiculopathy, lumbosacral region: Secondary | ICD-10-CM | POA: Diagnosis not present

## 2022-05-16 DIAGNOSIS — M47893 Other spondylosis, cervicothoracic region: Secondary | ICD-10-CM | POA: Diagnosis not present

## 2022-05-20 DIAGNOSIS — J4520 Mild intermittent asthma, uncomplicated: Secondary | ICD-10-CM | POA: Diagnosis not present

## 2022-05-20 DIAGNOSIS — L299 Pruritus, unspecified: Secondary | ICD-10-CM | POA: Diagnosis not present

## 2022-05-20 DIAGNOSIS — I10 Essential (primary) hypertension: Secondary | ICD-10-CM | POA: Diagnosis not present

## 2022-05-20 DIAGNOSIS — L03115 Cellulitis of right lower limb: Secondary | ICD-10-CM | POA: Diagnosis not present

## 2022-05-20 DIAGNOSIS — Z6835 Body mass index (BMI) 35.0-35.9, adult: Secondary | ICD-10-CM | POA: Diagnosis not present

## 2022-05-20 DIAGNOSIS — E785 Hyperlipidemia, unspecified: Secondary | ICD-10-CM | POA: Diagnosis not present

## 2022-05-20 DIAGNOSIS — I700 Atherosclerosis of aorta: Secondary | ICD-10-CM | POA: Diagnosis not present

## 2022-05-20 DIAGNOSIS — E1169 Type 2 diabetes mellitus with other specified complication: Secondary | ICD-10-CM | POA: Diagnosis not present

## 2022-05-21 DIAGNOSIS — Z1231 Encounter for screening mammogram for malignant neoplasm of breast: Secondary | ICD-10-CM | POA: Diagnosis not present

## 2022-05-22 ENCOUNTER — Encounter: Payer: Self-pay | Admitting: Hematology and Oncology

## 2022-05-23 NOTE — Progress Notes (Signed)
[facility]  [address] [place],  [place]  [address] [phone]   Clinic Day:05/28/22   Referring physician: Philemon Kingdom, MD    CHIEF COMPLAINT:  CC: History of stage 0 breast cancer   Current Treatment:  Surveillance   HISTORY OF PRESENT ILLNESS:  Autumn Carter is a 67 y.o. female with a history of stage 0 breast cancer diagnosed in December of 2010 and treated with a lumpectomy.  Pathology revealed a 1.6 cm grade 2 ductal carcinoma in situ with an area of focal necrosis for a Tis N0 M0.  Estrogen and progesterone receptors were positive and she was treated with postoperative radiation.  She declined chemoprevention and is on yearly follow-up.  Dr. Georgiana Shore last did mammography in January of this year and has scheduled her next one for February 22, 2016. She had acupuncture yesterday which has helped her and she does have asthma with chronic dyspnea.  She has had cataract surgery.  She has not taken her blood pressure medication yet today.  She denies pain other than occasional aching of her legs.  She has had a hysterectomy in the past by Dr. Oliva Bustard.  She did have BRCA testing which was negative.  Her family history is extensive for malignancy.  Her maternal aunt had breast cancer and a paternal aunt had breast cancer.  Her maternal grandmother had bilateral mastectomies for breast cancer at age 62 but still lived to be 52. A paternal great aunt had breast cancer.  Her paternal grandmother had a malignancy of her face but later died of colon cancer.  A paternal niece had breast cancer and had bilateral mastectomies and a paternal uncle had some form of intestinal malignancy as well as a grandfather with possible stomach cancer.  The patient 's mother has had gastrointestinal upset and had 20 polyps removed on her last colonoscopy.  We do not know whether these were hyperplastic or adenomatous.  Based on the patient 's personal and family  history, she is a candidate for update genetic testing by national guidelines, and this was done last year.  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  She had an injury to her lower right leg from a nail last year and apparently this became infected and she was on antibiotics.  In June of 2018, she had problems with shortness of breath and abnormal D-dimer, but a CT of the chest was negative.  An ultrasound for deep venous thrombosis was negative, and an echocardiogram was also normal.  Bilateral mammogram in February of 2019 was clear.  Dr. Sudie Bailey sees her for regular follow-up and labs.  She is now retired.   INTERVAL HISTORY:  Autumn Carter is here for routine follow up for her history of stage 0 breast cancer, diagnosed in December of 2010 and treated with a lumpectomy. Patient states that she well and has no complaints of pain. She informed me that her daughter was murdered 5 weeks ago. She also informed me that she is still having issues with Gout and the Allopurinol she was taking had no results. She sees a urologist every 4 months and continues to take D-mannose 500mg  every other day to prevent recurrent UTI's. Her last colonoscopy was on 04/12/2021. Her annual screening mammogram on 05/21/2022 was clear.   Dr. Sudie Bailey performed labs and I will request a copy for me to keep. I will  see her back in 1 year with bilateral screening mammogram. She denies signs of infection such as sore throat, sinus drainage, cough, or urinary symptoms.  She denies fevers or recurrent chills. She denies pain. She denies nausea, vomiting, chest pain, dyspnea or cough. Her appetite is good and her weight has increased 3 pounds over last 5 months .   REVIEW OF SYSTEMS:  Review of Systems  Constitutional: Negative.  Negative for appetite change, chills, diaphoresis, fatigue, fever and unexpected weight change.  HENT:  Negative.   Negative for hearing loss, lump/mass, mouth sores, nosebleeds, sore throat, tinnitus, trouble swallowing and voice change.   Eyes: Negative.  Negative for eye problems and icterus.  Respiratory: Negative.  Negative for chest tightness, cough, hemoptysis, shortness of breath and wheezing.   Cardiovascular: Negative.  Negative for chest pain, leg swelling and palpitations.  Gastrointestinal: Negative.  Negative for abdominal distention, abdominal pain, blood in stool, constipation, diarrhea, nausea, rectal pain and vomiting.  Endocrine: Negative.   Genitourinary: Negative.  Negative for bladder incontinence, difficulty urinating, dyspareunia, dysuria, frequency, hematuria, menstrual problem, nocturia, pelvic pain, vaginal bleeding and vaginal discharge.   Musculoskeletal:  Positive for arthralgias (Gout). Negative for back pain, flank pain, gait problem, myalgias, neck pain and neck stiffness.  Skin: Negative.  Negative for itching, rash and wound.  Neurological:  Negative for dizziness, extremity weakness, gait problem, headaches, light-headedness, numbness, seizures and speech difficulty.  Hematological: Negative.  Negative for adenopathy. Does not bruise/bleed easily.  Psychiatric/Behavioral: Negative.  Negative for confusion, decreased concentration, depression, sleep disturbance and suicidal ideas. The patient is not nervous/anxious.    VITALS:  BSA: 1.92 m Wt: 186 lb 6.4 oz (84.6 kg) Ht: 5\' 2"  (1.575 m) BP: 140/82 BMI: 34.09 kg/m Wt Readings from Last 3 Encounters:  06/03/22 187 lb 6.4 oz (85 kg)  05/28/22 186 lb 6.4 oz (84.6 kg)  01/16/22 183 lb 9.6 oz (83.3 kg)  Performance status (ECOG): 0 - Asymptomatic   PHYSICAL EXAM:  Physical Exam Vitals and nursing note reviewed.  Constitutional:      General: She is not in acute distress.    Appearance: Normal appearance. She is normal weight. She is not ill-appearing, toxic-appearing or diaphoretic.  HENT:     Head: Normocephalic and  atraumatic.     Right Ear: Tympanic membrane, ear canal and external ear normal. There is no impacted cerumen.     Left Ear: Ear canal and external ear normal. There is no impacted cerumen.     Nose: Nose normal. No congestion or rhinorrhea.     Mouth/Throat:     Mouth: Mucous membranes are moist.     Pharynx: Oropharynx is clear. No oropharyngeal exudate or posterior oropharyngeal erythema.  Eyes:     General: No scleral icterus.       Right eye: No discharge.        Left eye: No discharge.     Extraocular Movements: Extraocular movements intact.     Conjunctiva/sclera: Conjunctivae normal.     Pupils: Pupils are equal, round, and reactive to light.  Neck:     Vascular: No carotid bruit.  Cardiovascular:     Rate and Rhythm: Normal rate and regular rhythm.     Pulses: Normal pulses.     Heart sounds: Normal heart sounds. No murmur heard.    No friction rub. No gallop.  Pulmonary:     Effort: Pulmonary effort is normal. No respiratory distress.     Breath sounds: Normal breath sounds.  No stridor. No wheezing, rhonchi or rales.  Chest:     Chest wall: No tenderness.     Comments: Deep scar in the upper inner quadrant of the left breast which is well healed. Inversion of bilateral nipple. No masses in either breast.  Abdominal:     General: Bowel sounds are normal. There is no distension.     Palpations: Abdomen is soft. There is no hepatomegaly, splenomegaly or mass.     Tenderness: There is no abdominal tenderness. There is no right CVA tenderness, left CVA tenderness, guarding or rebound.     Hernia: No hernia is present.  Musculoskeletal:        General: No swelling, tenderness, deformity or signs of injury. Normal range of motion.     Cervical back: Normal range of motion and neck supple. No rigidity or tenderness.     Right lower leg: No edema.     Left lower leg: No edema.  Lymphadenopathy:     Cervical: No cervical adenopathy.     Right cervical: No superficial, deep or  posterior cervical adenopathy.    Left cervical: No superficial, deep or posterior cervical adenopathy.     Upper Body:     Right upper body: No supraclavicular, axillary or pectoral adenopathy.     Left upper body: No supraclavicular, axillary or pectoral adenopathy.  Skin:    General: Skin is warm and dry.     Coloration: Skin is not jaundiced or pale.     Findings: No bruising, erythema, lesion or rash.  Neurological:     General: No focal deficit present.     Mental Status: She is alert and oriented to person, place, and time. Mental status is at baseline.     Cranial Nerves: No cranial nerve deficit.     Sensory: No sensory deficit.     Motor: No weakness.     Coordination: Coordination normal.     Gait: Gait normal.     Deep Tendon Reflexes: Reflexes normal.  Psychiatric:        Mood and Affect: Mood normal.        Behavior: Behavior normal.        Thought Content: Thought content normal.        Judgment: Judgment normal.     LABS:   Component Ref Range & Units 01/16/2022  Glucose 70 - 99 mg/dL 161 High   BUN 8 - 27 mg/dL 11  Creatinine, Ser 0.96 - 1.00 mg/dL 0.45  eGFR >40 JW/JXB/1.47 91  BUN/Creatinine Ratio 12 - 28 15  Sodium 134 - 144 mmol/L 139  Potassium 3.5 - 5.2 mmol/L 4.2  Chloride 96 - 106 mmol/L 104  CO2 20 - 29 mmol/L 22  Calcium 8.7 - 10.3 mg/dL 82.9     STUDIES:       Allergies  Allergen Reactions   Lansoprazole Swelling and Other (See Comments)    Stomach pain   Pantoprazole Other (See Comments)    Stomach pain, excess nasal mucus   Zestril [Lisinopril] Anaphylaxis and Other (See Comments)    Heart attack symptoms   Ace Inhibitors Swelling and Other (See Comments)   Amlodipine Other (See Comments)    Abdominal pain.   Amlodipine Besylate Other (See Comments)   Amoxicillin Other (See Comments)   Arnuity Ellipta [Fluticasone Furoate] Other (See Comments)    Eye issues   Aspirin Other (See Comments)   Beclomethasone Nausea Only  and Other (See Comments)    Runny nose  Beta Adrenergic Blockers     Eye Infections.    Cephalexin    Cephalosporins Other (See Comments)    Unknown   Clindamycin/Lincomycin    Clotrimazole     Regurgitation    Codeine Other (See Comments)   Codeine Sulfate    Covid-19 Ad26 Vaccine(Janssen)    Doxazosin Other (See Comments)    pain   Flovent Hfa [Fluticasone]     Heartburn   Hydralazine Hcl    Hydrochlorothiazide     Kidney swelling   Levaquin [Levofloxacin In D5w]    Macrobid [Nitrofurantoin Monohyd Macro]    Metoprolol Tartrate    Montelukast Sodium Other (See Comments)    heartburn  Other reaction(s): GI Upset (intolerance)   Morphine And Related Other (See Comments)    hallucinations     Moxifloxacin Hcl     Pain in arms and right wrist   Naltrexone Nausea Only and Other (See Comments)   Nasonex [Mometasone Furoate]    Nifedipine Other (See Comments)    Headache    Nitrofurantoin Cough    Cough   Olmesartan     Olmesartan Medoxomil: trembling, stomach upset   Olmesartan Medoxomil-Hctz Swelling and Other (See Comments)    Tongue and Lip swelling  Other reaction(s): Other (See Comments)  Unknown  Tongue and Lip swelling, Unknown   Other Other (See Comments)   Prednisone     High dose = Increase in GERD.   Propoxyphene Other (See Comments)    Unknown  Unknown, Unknown   Pulmicort [Budesonide]     Body ached all over---took over the counter:  Equate nasal spray form of this medication per patient '   Ranitidine Itching and Swelling   Silvadene [Silver Sulfadiazine]     Didn't feel well   Spironolactone Other (See Comments)    Worsened GERD   Sulfa Antibiotics    Sulfamethoxazole Other (See Comments)   Tobramycin Other (See Comments)    Eye redness, cheek redness, breast mucus leakage  Other reaction(s): Flushing (ALLERGY/intolerance)   Voltaren [Diclofenac] Swelling and Other (See Comments)    Asthma   Zofran [Ondansetron]     Constipation      Current Medications:       Current Outpatient Medications  Medication Sig Dispense Refill   Albuterol Sulfate (PROAIR HFA IN) Inhale into the lungs as needed.       ASMANEX 60 METERED DOSES 220 MCG/INH inhaler INHALE 2 PUFFS IN THE EVENING ONCE A DAY   2   calcium citrate-vitamin D (CITRACAL+D) 315-200 MG-UNIT tablet Take 1 tablet by mouth daily.       cloNIDine (CATAPRES - DOSED IN MG/24 HR) 0.2 mg/24hr patch APPLY 1 PATCH TO SKIN ONCE PER WEEK TRANSDERMAL 90 DAYS   1   dexlansoprazole (DEXILANT) 60 MG capsule Take 60 mg by mouth daily.       diazepam (VALIUM) 5 MG tablet Take 2.5-5 mg by mouth 2 (two) times daily as needed for anxiety (sleep).        levalbuterol (XOPENEX) 1.25 MG/3ML nebulizer solution INHALE EVERY 8 HOURS       montelukast (SINGULAIR) 10 MG tablet Take 10 mg by mouth at bedtime.       nystatin (MYCOSTATIN) 100000 UNIT/ML suspension Swish and swallow 5mL three times daily as directed 450 mL 1   ondansetron (ZOFRAN-ODT) 4 MG disintegrating tablet Take 4 mg by mouth 3 (three) times daily as needed for nausea or vomiting.       Probiotic  Product (ALIGN PO) Take 1 tablet by mouth daily.       tiZANidine (ZANAFLEX) 2 MG tablet Take 2 mg by mouth every 8 (eight) hours as needed for muscle spasms.       traMADol (ULTRAM) 50 MG tablet TAKE 1 TABLET BY MOUTH EVERY 6 HOURS AS NEEDED FOR 5 DAYS   0   valsartan (DIOVAN) 320 MG tablet Take 320 mg by mouth daily.       Vitamin D, Cholecalciferol, 1000 units TABS Take by mouth daily.        No current facility-administered medications for this visit.     ASSESSMENT & PLAN:  Assessment:   1.  Stage 0 breast cancer, diagnosed in December 2010.  She remains without evidence of recurrence.   Plan: She continues to do well physically, although she informed me that her daughter was murdered 5 weeks ago. She is still having issues with Gout and the Allopurinol she was taking had no results. She continues to take D-mannose 500mg   every other day to prevent recurrent UTI's. Her last colonoscopy was on 04/12/2021. Her annual screening mammogram on 05/21/2022 was clear.   Dr. Sudie Bailey performed labs and I will request a copy for me to keep. I will see her back in 1 year with bilateral screening mammogram.  She understands and agrees with this plan of care.    I provided 25 minutes of face-to-face time during this this encounter and > 50% was spent counseling as documented under my assessment and plan.      Dellia Beckwith, MD [facility] [address] [place] [address] Dept: [phone] Dept Fax: [phone]      Rulon Sera Lassiter,acting as a scribe for Dellia Beckwith, MD.,have documented all relevant documentation on the behalf of Dellia Beckwith, MD,as directed by  Dellia Beckwith, MD while in the presence of Dellia Beckwith, MD.  I have reviewed this report as typed by the medical scribe, and it is complete and accurate.

## 2022-05-28 ENCOUNTER — Inpatient Hospital Stay: Payer: PPO | Attending: Oncology | Admitting: Oncology

## 2022-05-28 ENCOUNTER — Other Ambulatory Visit: Payer: Self-pay | Admitting: Oncology

## 2022-05-28 ENCOUNTER — Encounter: Payer: Self-pay | Admitting: Oncology

## 2022-05-28 ENCOUNTER — Telehealth: Payer: Self-pay

## 2022-05-28 VITALS — BP 140/82 | HR 82 | Temp 97.7°F | Resp 18 | Ht 62.0 in | Wt 186.4 lb

## 2022-05-28 DIAGNOSIS — C50212 Malignant neoplasm of upper-inner quadrant of left female breast: Secondary | ICD-10-CM

## 2022-05-28 DIAGNOSIS — Z853 Personal history of malignant neoplasm of breast: Secondary | ICD-10-CM

## 2022-05-28 DIAGNOSIS — Z170 Estrogen receptor positive status [ER+]: Secondary | ICD-10-CM | POA: Diagnosis not present

## 2022-05-28 NOTE — Telephone Encounter (Signed)
-----   Message from Dellia Beckwith, MD sent at 05/28/2022 10:33 AM EDT ----- Regarding: labs Pls request recent labs from Dr. Sudie Bailey

## 2022-05-28 NOTE — Telephone Encounter (Signed)
Requested and will be faxed over soon

## 2022-06-03 ENCOUNTER — Ambulatory Visit (INDEPENDENT_AMBULATORY_CARE_PROVIDER_SITE_OTHER): Payer: PPO | Admitting: Allergy and Immunology

## 2022-06-03 ENCOUNTER — Encounter: Payer: Self-pay | Admitting: Allergy and Immunology

## 2022-06-03 VITALS — BP 124/82 | HR 82 | Resp 16 | Ht 61.5 in | Wt 187.4 lb

## 2022-06-03 DIAGNOSIS — K219 Gastro-esophageal reflux disease without esophagitis: Secondary | ICD-10-CM

## 2022-06-03 DIAGNOSIS — J4489 Other specified chronic obstructive pulmonary disease: Secondary | ICD-10-CM | POA: Diagnosis not present

## 2022-06-03 DIAGNOSIS — B370 Candidal stomatitis: Secondary | ICD-10-CM

## 2022-06-03 DIAGNOSIS — J3089 Other allergic rhinitis: Secondary | ICD-10-CM

## 2022-06-03 MED ORDER — NYSTATIN 100000 UNIT/ML MT SUSP: OROMUCOSAL | 5 refills | Status: AC
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
## 2022-06-03 NOTE — Patient Instructions (Addendum)
  1. Continue to Treat inflammation:   A. Asmanex 220 - 1-2 inhalations 1-2 times per day (MAX=4/day)    2. Continue to Treat reflux:   A.  Generic Dexilant 60 mg 3-7 times per week.    3.  If needed:   A.  Nystatin oral solution-5 mL after Asmanex - rinse and spit  B.  Proair HFA  C.  Antihistamine  D.  Nasal saline  E.  Dextromethorphan containing cough suppressant  4. Return to clinic in 6 months or earlier if problem.

## 2022-06-04 ENCOUNTER — Encounter: Payer: Self-pay | Admitting: Allergy and Immunology

## 2022-06-05 ENCOUNTER — Other Ambulatory Visit: Payer: Self-pay

## 2022-06-05 ENCOUNTER — Telehealth: Payer: Self-pay

## 2022-06-05 MED ORDER — CLOTRIMAZOLE 10 MG MT TROC: OROMUCOSAL | 2 refills | Status: DC

## 2022-06-05 NOTE — Addendum Note (Signed)
Addended by: Deborra Medina on: 06/05/2022 03:39 PM   Modules accepted: Orders

## 2022-06-05 NOTE — Telephone Encounter (Signed)
Prescription sent.and patient informed. 

## 2022-06-05 NOTE — Addendum Note (Signed)
Addended by: Deborra Medina on: 06/05/2022 08:51 AM   Modules accepted: Orders

## 2022-06-05 NOTE — Telephone Encounter (Signed)
Patient states the nystatin solution is making her mouth really dry and she would like something else called in. Please advice. Thank you

## 2022-06-19 DIAGNOSIS — M4721 Other spondylosis with radiculopathy, occipito-atlanto-axial region: Secondary | ICD-10-CM | POA: Diagnosis not present

## 2022-06-19 DIAGNOSIS — M5388 Other specified dorsopathies, sacral and sacrococcygeal region: Secondary | ICD-10-CM | POA: Diagnosis not present

## 2022-06-19 DIAGNOSIS — M47817 Spondylosis without myelopathy or radiculopathy, lumbosacral region: Secondary | ICD-10-CM | POA: Diagnosis not present

## 2022-06-19 DIAGNOSIS — M9904 Segmental and somatic dysfunction of sacral region: Secondary | ICD-10-CM | POA: Diagnosis not present

## 2022-06-19 DIAGNOSIS — M9903 Segmental and somatic dysfunction of lumbar region: Secondary | ICD-10-CM | POA: Diagnosis not present

## 2022-06-19 DIAGNOSIS — M9901 Segmental and somatic dysfunction of cervical region: Secondary | ICD-10-CM | POA: Diagnosis not present

## 2022-07-02 DIAGNOSIS — Z6835 Body mass index (BMI) 35.0-35.9, adult: Secondary | ICD-10-CM | POA: Diagnosis not present

## 2022-07-02 DIAGNOSIS — W57XXXA Bitten or stung by nonvenomous insect and other nonvenomous arthropods, initial encounter: Secondary | ICD-10-CM | POA: Diagnosis not present

## 2022-07-02 DIAGNOSIS — H6120 Impacted cerumen, unspecified ear: Secondary | ICD-10-CM | POA: Diagnosis not present

## 2022-07-10 DIAGNOSIS — H6120 Impacted cerumen, unspecified ear: Secondary | ICD-10-CM | POA: Diagnosis not present

## 2022-07-10 DIAGNOSIS — Z6835 Body mass index (BMI) 35.0-35.9, adult: Secondary | ICD-10-CM | POA: Diagnosis not present

## 2022-07-22 DIAGNOSIS — M9901 Segmental and somatic dysfunction of cervical region: Secondary | ICD-10-CM | POA: Diagnosis not present

## 2022-07-22 DIAGNOSIS — M47813 Spondylosis without myelopathy or radiculopathy, cervicothoracic region: Secondary | ICD-10-CM | POA: Diagnosis not present

## 2022-07-22 DIAGNOSIS — M5388 Other specified dorsopathies, sacral and sacrococcygeal region: Secondary | ICD-10-CM | POA: Diagnosis not present

## 2022-07-22 DIAGNOSIS — M9902 Segmental and somatic dysfunction of thoracic region: Secondary | ICD-10-CM | POA: Diagnosis not present

## 2022-07-22 DIAGNOSIS — M47892 Other spondylosis, cervical region: Secondary | ICD-10-CM | POA: Diagnosis not present

## 2022-07-22 DIAGNOSIS — M9904 Segmental and somatic dysfunction of sacral region: Secondary | ICD-10-CM | POA: Diagnosis not present

## 2022-07-29 DIAGNOSIS — M542 Cervicalgia: Secondary | ICD-10-CM | POA: Diagnosis not present

## 2022-07-29 DIAGNOSIS — Z6835 Body mass index (BMI) 35.0-35.9, adult: Secondary | ICD-10-CM | POA: Diagnosis not present

## 2022-07-29 DIAGNOSIS — M109 Gout, unspecified: Secondary | ICD-10-CM | POA: Diagnosis not present

## 2022-07-29 DIAGNOSIS — L0292 Furuncle, unspecified: Secondary | ICD-10-CM | POA: Diagnosis not present

## 2022-07-29 DIAGNOSIS — J0100 Acute maxillary sinusitis, unspecified: Secondary | ICD-10-CM | POA: Diagnosis not present

## 2022-08-22 DIAGNOSIS — M47817 Spondylosis without myelopathy or radiculopathy, lumbosacral region: Secondary | ICD-10-CM | POA: Diagnosis not present

## 2022-08-22 DIAGNOSIS — M9903 Segmental and somatic dysfunction of lumbar region: Secondary | ICD-10-CM | POA: Diagnosis not present

## 2022-08-22 DIAGNOSIS — M47812 Spondylosis without myelopathy or radiculopathy, cervical region: Secondary | ICD-10-CM | POA: Diagnosis not present

## 2022-08-22 DIAGNOSIS — M9902 Segmental and somatic dysfunction of thoracic region: Secondary | ICD-10-CM | POA: Diagnosis not present

## 2022-08-22 DIAGNOSIS — M47813 Spondylosis without myelopathy or radiculopathy, cervicothoracic region: Secondary | ICD-10-CM | POA: Diagnosis not present

## 2022-08-22 DIAGNOSIS — M9901 Segmental and somatic dysfunction of cervical region: Secondary | ICD-10-CM | POA: Diagnosis not present

## 2022-09-10 DIAGNOSIS — N390 Urinary tract infection, site not specified: Secondary | ICD-10-CM | POA: Diagnosis not present

## 2022-09-10 DIAGNOSIS — L300 Nummular dermatitis: Secondary | ICD-10-CM | POA: Diagnosis not present

## 2022-09-10 DIAGNOSIS — N952 Postmenopausal atrophic vaginitis: Secondary | ICD-10-CM | POA: Diagnosis not present

## 2022-09-10 DIAGNOSIS — R310 Gross hematuria: Secondary | ICD-10-CM | POA: Diagnosis not present

## 2022-09-10 DIAGNOSIS — L820 Inflamed seborrheic keratosis: Secondary | ICD-10-CM | POA: Diagnosis not present

## 2022-09-19 DIAGNOSIS — M9903 Segmental and somatic dysfunction of lumbar region: Secondary | ICD-10-CM | POA: Diagnosis not present

## 2022-09-19 DIAGNOSIS — M47812 Spondylosis without myelopathy or radiculopathy, cervical region: Secondary | ICD-10-CM | POA: Diagnosis not present

## 2022-09-19 DIAGNOSIS — M9902 Segmental and somatic dysfunction of thoracic region: Secondary | ICD-10-CM | POA: Diagnosis not present

## 2022-09-19 DIAGNOSIS — M47817 Spondylosis without myelopathy or radiculopathy, lumbosacral region: Secondary | ICD-10-CM | POA: Diagnosis not present

## 2022-09-19 DIAGNOSIS — M9901 Segmental and somatic dysfunction of cervical region: Secondary | ICD-10-CM | POA: Diagnosis not present

## 2022-09-19 DIAGNOSIS — M47813 Spondylosis without myelopathy or radiculopathy, cervicothoracic region: Secondary | ICD-10-CM | POA: Diagnosis not present

## 2022-09-25 DIAGNOSIS — I700 Atherosclerosis of aorta: Secondary | ICD-10-CM | POA: Diagnosis not present

## 2022-09-25 DIAGNOSIS — Z79899 Other long term (current) drug therapy: Secondary | ICD-10-CM | POA: Diagnosis not present

## 2022-09-25 DIAGNOSIS — I10 Essential (primary) hypertension: Secondary | ICD-10-CM | POA: Diagnosis not present

## 2022-09-25 DIAGNOSIS — E1169 Type 2 diabetes mellitus with other specified complication: Secondary | ICD-10-CM | POA: Diagnosis not present

## 2022-09-25 DIAGNOSIS — M47816 Spondylosis without myelopathy or radiculopathy, lumbar region: Secondary | ICD-10-CM | POA: Diagnosis not present

## 2022-09-25 DIAGNOSIS — Z6836 Body mass index (BMI) 36.0-36.9, adult: Secondary | ICD-10-CM | POA: Diagnosis not present

## 2022-09-25 DIAGNOSIS — M109 Gout, unspecified: Secondary | ICD-10-CM | POA: Diagnosis not present

## 2022-09-25 DIAGNOSIS — E785 Hyperlipidemia, unspecified: Secondary | ICD-10-CM | POA: Diagnosis not present

## 2022-10-21 DIAGNOSIS — D582 Other hemoglobinopathies: Secondary | ICD-10-CM | POA: Diagnosis not present

## 2022-10-21 DIAGNOSIS — M109 Gout, unspecified: Secondary | ICD-10-CM | POA: Diagnosis not present

## 2022-10-21 DIAGNOSIS — R718 Other abnormality of red blood cells: Secondary | ICD-10-CM | POA: Diagnosis not present

## 2022-10-21 DIAGNOSIS — Z6836 Body mass index (BMI) 36.0-36.9, adult: Secondary | ICD-10-CM | POA: Diagnosis not present

## 2022-10-21 DIAGNOSIS — B351 Tinea unguium: Secondary | ICD-10-CM | POA: Diagnosis not present

## 2022-10-23 DIAGNOSIS — M47816 Spondylosis without myelopathy or radiculopathy, lumbar region: Secondary | ICD-10-CM | POA: Diagnosis not present

## 2022-10-23 DIAGNOSIS — M47812 Spondylosis without myelopathy or radiculopathy, cervical region: Secondary | ICD-10-CM | POA: Diagnosis not present

## 2022-10-23 DIAGNOSIS — M9902 Segmental and somatic dysfunction of thoracic region: Secondary | ICD-10-CM | POA: Diagnosis not present

## 2022-10-23 DIAGNOSIS — M9903 Segmental and somatic dysfunction of lumbar region: Secondary | ICD-10-CM | POA: Diagnosis not present

## 2022-10-23 DIAGNOSIS — M4723 Other spondylosis with radiculopathy, cervicothoracic region: Secondary | ICD-10-CM | POA: Diagnosis not present

## 2022-10-23 DIAGNOSIS — M9901 Segmental and somatic dysfunction of cervical region: Secondary | ICD-10-CM | POA: Diagnosis not present

## 2022-12-04 ENCOUNTER — Encounter: Payer: Self-pay | Admitting: Allergy and Immunology

## 2022-12-04 ENCOUNTER — Ambulatory Visit: Payer: PPO | Admitting: Allergy and Immunology

## 2022-12-04 VITALS — BP 146/84 | HR 72 | Resp 16 | Ht 60.2 in | Wt 196.6 lb

## 2022-12-04 DIAGNOSIS — B370 Candidal stomatitis: Secondary | ICD-10-CM | POA: Diagnosis not present

## 2022-12-04 DIAGNOSIS — J4489 Other specified chronic obstructive pulmonary disease: Secondary | ICD-10-CM

## 2022-12-04 DIAGNOSIS — K219 Gastro-esophageal reflux disease without esophagitis: Secondary | ICD-10-CM

## 2022-12-04 DIAGNOSIS — J3089 Other allergic rhinitis: Secondary | ICD-10-CM | POA: Diagnosis not present

## 2022-12-04 NOTE — Progress Notes (Unsigned)
- [place] - [place] - [place] - [place]   Follow-up Note  Referring Provider: Philemon Kingdom, MD Primary Provider: Philemon Kingdom, MD Date of Office Visit: 12/04/2022  Subjective:   Autumn Carter (DOB: March 20, 1955) is a 67 y.o. female who [facility] 12[facility]/2022 in re-evaluation of the following:  HPI: [name] returns to this clinic in evaluation of COPD/asthma overlap, LPR, allergic rhinitis.  I last saw her in this clinic 03 June 2022.  She has had an excellent interval of time regarding her respiratory tract and has not required a systemic steroid or an antibiotic for any type of respiratory tract issue and rarely uses any albuterol while she continues on Asmanex at just 1 inhalation 1 time per day.  She is also been using some Nasacort  on occasion.  She believes that her reflux is under very good control while using Dexilant just 3 times per week.  She intermittently uses nystatin and she intermittently uses Biotene mouthwash and she believes that her mouth is been doing relatively well.  She does not receive the flu vaccine.  Allergies as of 12/04/2022       Reactions   Lansoprazole Swelling, Other (See Comments)   Stomach pain   Pantoprazole Other (See Comments)   Stomach pain, excess nasal mucus   Zestril [lisinopril] Anaphylaxis, Other (See Comments)   Heart attack symptoms   Ace Inhibitors Swelling, Other (See Comments)   Allopurinol Nausea And Vomiting   Amlodipine Other (See Comments)   Abdominal pain.   Amlodipine Besylate Other (See Comments)   Amoxicillin Other (See Comments)   Arnuity Ellipta [fluticasone Furoate] Other (See Comments)   Eye issues   Aspirin Other (See Comments)   Beclomethasone Nausea Only, Other (See Comments)   Runny nose   Beta Adrenergic Blockers    Eye Infections.    Cephalexin    Cephalosporins Other (See Comments)   Unknown   Clindamycin/lincomycin    Clotrimazole     Regurgitation   Codeine Other (See Comments)   Codeine Sulfate    Covid-19 Ad26 Vaccine(janssen)    Doxazosin Other (See Comments)   pain   Flovent Hfa [fluticasone]    Heartburn   Hydralazine Hcl    Hydrochlorothiazide    Kidney swelling   Levaquin [levofloxacin In D5w]    Macrobid [nitrofurantoin Monohyd Macro]    Metoprolol Tartrate    Montelukast Sodium Other (See Comments)   heartburn Other reaction(s): GI Upset (intolerance)   Morphine And Codeine Other (See Comments)   hallucinations     Moxifloxacin Hcl    Pain in arms and right wrist   Naltrexone Nausea Only, Other (See Comments)   Nasonex [mometasone Furoate]    Nifedipine Other (See Comments)   Headache   Nitrofurantoin Cough   Cough   Olmesartan    Olmesartan Medoxomil: trembling, stomach upset   Olmesartan Medoxomil-hctz Swelling, Other (See Comments)   Tongue and Lip swelling Other reaction(s): Other (See Comments) Unknown Tongue and Lip swelling, Unknown   Other Other (See Comments)   Prednisone    High dose = Increase in GERD.   Propoxyphene Other (See Comments)   Unknown Unknown, Unknown   Pulmicort [budesonide]    Body ached all over---took over the counter:  Equate nasal spray form of this medication per patient '   Ranitidine Itching, Swelling   Silvadene [silver Sulfadiazine]    Didn't feel well   Spironolactone Other (See Comments)   Worsened  GERD   Sulfa Antibiotics    Sulfamethoxazole Other (See Comments)   Tobramycin Other (See Comments)   Eye redness, cheek redness, breast mucus leakage Other reaction(s): Flushing (ALLERGY/intolerance)   Voltaren [diclofenac] Swelling, Other (See Comments)   Asthma   Zofran [ondansetron]    Constipation        Medication List    albuterol 108 (90 Base) MCG/ACT inhaler Commonly known as: VENTOLIN HFA Inhale 2 puffs every 4-6 hours as needed for cough, wheeze, tightness in chest, or shortness of breath   ALIGN PO Take 1 tablet by mouth  daily.   Asmanex (60 Metered Doses) 220 MCG/INH inhaler Generic drug: mometasone Inhale 2 puffs into the lungs at bedtime.   CALCIUM 500 PO Take by mouth 2 (two) times daily.   ciclopirox 8 % solution Commonly known as: PENLAC Apply topically.   clobetasol ointment 0.05 % Commonly known as: TEMOVATE Apply 1 Application topically 2 (two) times daily as needed.   cloNIDine 0.2 mg/24hr patch Commonly known as: CATAPRES - Dosed in mg/24 hr APPLY 1 PATCH TO SKIN ONCE PER WEEK TRANSDERMAL 90 DAYS   clotrimazole 10 MG troche Commonly known as: MYCELEX Use after asmanex   colchicine 0.6 MG tablet Take 0.6 mg by mouth daily.   D-Mannose 500 MG Caps Take by mouth every other day.   dexlansoprazole 60 MG capsule Commonly known as: DEXILANT Take 60 mg by mouth every other day.   diazepam 5 MG tablet Commonly known as: VALIUM Take 2.5-5 mg by mouth 2 (two) times daily as needed for anxiety (sleep).   glimepiride 1 MG tablet Commonly known as: AMARYL Take 1 mg by mouth every morning.   [place]ublia 10 % Soln Generic drug: Efinaconazole   levalbuterol 1.25 MG/3ML nebulizer solution Commonly known as: XOPENEX Take 1.25 mg by nebulization every 8 (eight) hours as needed.   montelukast 10 MG tablet Commonly known as: SINGULAIR Take 10 mg by mouth at bedtime.   Nasacort Allergy 24HR 55 MCG/ACT Aero nasal inhaler Generic drug: triamcinolone Place 1 spray into the nose 2 (two) times daily.   nystatin 100000 UNIT/ML suspension Commonly known as: MYCOSTATIN 5 mL after Asmanex - rinse and spit   rosuvastatin 5 MG tablet Commonly known as: CRESTOR Take 5 mg by mouth once a week.   tiZANidine 2 MG tablet Commonly known as: ZANAFLEX Take 2 mg by mouth every 8 (eight) hours as needed for muscle spasms.   traMADol 50 MG tablet Commonly known as: ULTRAM Take 50 mg by mouth every 6 (six) hours as needed.   triamcinolone cream 0.1 % Commonly known as: KENALOG Apply 1 Application  topically as needed.   valsartan 320 MG tablet Commonly known as: DIOVAN Take 320 mg by mouth daily.   Vitamin D (Cholecalciferol) 25 MCG (1000 UT) Tabs Take by mouth daily.   VOLTAREN EX Apply topically as needed.    Past Medical History:  Diagnosis Date   Allergic rhinitis    Anxiety    on meds   Arthritis    generalized   Asthma    uses inhaler/inhaler PRN   Breast cancer (HCC) 2010   LEFT   Cataract 2021   bilateral sx   Chronic kidney disease    hx of kidney stones   Dry eye    Family history of colon cancer    Family history of colonic polyps    Fatty liver    Gastric polyp    GERD (gastroesophageal reflux disease)  on meds   Gout    History of breast cancer    History of colon polyps    History of kidney stones    History of shingles    Hypertension    on meds   IBS (irritable bowel syndrome)    with constipation and diarrhea   Internal hemorrhage    Migraine    Pneumonia    PUD (peptic ulcer disease)    Rosacea    Seasonal allergies    UTI (urinary tract infection)     Past Surgical History:  Procedure Laterality Date   ABDOMINAL HYSTERECTOMY     BREAST LUMPECTOMY Left 01/30/2009   And radiotherapy   CATARACT EXTRACTION     CHOLECYSTECTOMY     COLONOSCOPY  2012   Gupta-Diverticulosis of colon. Internal hemorrhoids.   ESOPHAGOGASTRODUODENOSCOPY  05/10/2013   Small hiatal hernia. Incidental gastric polyps (status post polypectomy x3). Mild gastritis   KNEE SURGERY Right    s/p arthroscopic surgery   LAPAROSCOPIC HYSTERECTOMY  12/04/2009   Complete with bladder tack   NOSE SURGERY  1976   WISDOM TOOTH EXTRACTION      Review of systems negative except as noted in HPI / PMHx or noted below:  Review of Systems  Constitutional: Negative.   HENT: Negative.    Eyes: Negative.   Respiratory: Negative.    Cardiovascular: Negative.   Gastrointestinal: Negative.   Genitourinary: Negative.   Musculoskeletal: Negative.   Skin: Negative.    Neurological: Negative.   Endo/Heme/Allergies: Negative.   Psychiatric/Behavioral: Negative.       Objective:   Vitals:   12/04/22 0820 12/04/22 0847  BP: (!) 150/88 (!) 146/84  Pulse: 72   Resp: 16   SpO2: 97%    Height: 5' 0.2" (152.9 cm)  Weight: 196 lb 9.6 oz (89.2 kg)   Physical Exam Constitutional:      Appearance: She is not diaphoretic.  HENT:     Head: Normocephalic.     Right Ear: Tympanic membrane, ear canal and external ear normal.     Left Ear: Tympanic membrane, ear canal and external ear normal.     Nose: Nose normal. No mucosal edema or rhinorrhea.     Mouth/Throat:     Pharynx: Uvula midline. No oropharyngeal exudate.  Eyes:     Conjunctiva/sclera: Conjunctivae normal.  Neck:     Thyroid: No thyromegaly.     Trachea: Trachea normal. No tracheal tenderness or tracheal deviation.  Cardiovascular:     Rate and Rhythm: Normal rate and regular rhythm.     Heart sounds: Normal heart sounds, S1 normal and S2 normal. No murmur heard. Pulmonary:     Effort: No respiratory distress.     Breath sounds: Normal breath sounds. No stridor. No wheezing or rales.  Lymphadenopathy:     Head:     Right side of head: No tonsillar adenopathy.     Left side of head: No tonsillar adenopathy.     Cervical: No cervical adenopathy.  Skin:    Findings: No erythema or rash.     Nails: There is no clubbing.  Neurological:     Mental Status: She is alert.     Diagnostics: Spirometry was performed and demonstrated an FEV1 of 1.06 at 54 % of predicted.   Assessment and Plan:   1. COPD with asthma (HCC)   2. Other allergic rhinitis   3. LPRD (laryngopharyngeal reflux disease)   4. Thrush    1. Continue to Treat  inflammation:   A. Asmanex 220 - 1-2 inhalations 1-2 times per day (MAX=4/day)   B. Nasacort - 1 spray each nostril 3-7 times per week   2. Continue to Treat reflux:   A.  Generic Dexilant 60 mg 3-7 times per week.    3.  If needed:   A.  Nystatin  oral solution-5 mL after Asmanex - rinse and spit  B.  Proair HFA  C.  Antihistamine  D.  Nasal saline  4. Return to clinic in 6 months or earlier if problem.    Angelynne appears to be doing quite well.  She has a very good understanding of her disease state and how her medications work and appropriate dosing of her medications depending on disease activity and she will continue on anti-inflammatory agents for airway and therapy directed against reflux and we will see her back in this clinic in 6 months or earlier if there is a problem.   Laurette Schimke, MD Allergy / Immunology [facility]

## 2022-12-04 NOTE — Patient Instructions (Addendum)
  1. Continue to Treat inflammation:   A. Asmanex 220 - 1-2 inhalations 1-2 times per day (MAX=4/day)   B. Nasacort - 1 spray each nostril 3-7 times per week   2. Continue to Treat reflux:   A.  Generic Dexilant 60 mg 3-7 times per week.    3.  If needed:   A.  Nystatin oral solution-5 mL after Asmanex - rinse and spit  B.  Proair HFA  C.  Antihistamine  D.  Nasal saline  4. Return to clinic in 6 months or earlier if problem.

## 2022-12-05 ENCOUNTER — Encounter: Payer: Self-pay | Admitting: Allergy and Immunology

## 2022-12-06 DIAGNOSIS — M9902 Segmental and somatic dysfunction of thoracic region: Secondary | ICD-10-CM | POA: Diagnosis not present

## 2022-12-06 DIAGNOSIS — M9903 Segmental and somatic dysfunction of lumbar region: Secondary | ICD-10-CM | POA: Diagnosis not present

## 2022-12-06 DIAGNOSIS — M9901 Segmental and somatic dysfunction of cervical region: Secondary | ICD-10-CM | POA: Diagnosis not present

## 2022-12-06 DIAGNOSIS — M47814 Spondylosis without myelopathy or radiculopathy, thoracic region: Secondary | ICD-10-CM | POA: Diagnosis not present

## 2022-12-06 DIAGNOSIS — M4722 Other spondylosis with radiculopathy, cervical region: Secondary | ICD-10-CM | POA: Diagnosis not present

## 2022-12-06 DIAGNOSIS — M4727 Other spondylosis with radiculopathy, lumbosacral region: Secondary | ICD-10-CM | POA: Diagnosis not present

## 2022-12-18 DIAGNOSIS — I10 Essential (primary) hypertension: Secondary | ICD-10-CM | POA: Diagnosis not present

## 2022-12-18 DIAGNOSIS — R110 Nausea: Secondary | ICD-10-CM | POA: Diagnosis not present

## 2022-12-18 DIAGNOSIS — J40 Bronchitis, not specified as acute or chronic: Secondary | ICD-10-CM | POA: Diagnosis not present

## 2022-12-18 DIAGNOSIS — Z6837 Body mass index (BMI) 37.0-37.9, adult: Secondary | ICD-10-CM | POA: Diagnosis not present

## 2022-12-18 DIAGNOSIS — J4520 Mild intermittent asthma, uncomplicated: Secondary | ICD-10-CM | POA: Diagnosis not present

## 2022-12-18 DIAGNOSIS — E1169 Type 2 diabetes mellitus with other specified complication: Secondary | ICD-10-CM | POA: Diagnosis not present

## 2022-12-24 DIAGNOSIS — J4521 Mild intermittent asthma with (acute) exacerbation: Secondary | ICD-10-CM | POA: Diagnosis not present

## 2022-12-24 DIAGNOSIS — Z6837 Body mass index (BMI) 37.0-37.9, adult: Secondary | ICD-10-CM | POA: Diagnosis not present

## 2023-01-06 DIAGNOSIS — E785 Hyperlipidemia, unspecified: Secondary | ICD-10-CM | POA: Diagnosis not present

## 2023-01-06 DIAGNOSIS — Z853 Personal history of malignant neoplasm of breast: Secondary | ICD-10-CM | POA: Diagnosis not present

## 2023-01-06 DIAGNOSIS — Z79899 Other long term (current) drug therapy: Secondary | ICD-10-CM | POA: Diagnosis not present

## 2023-01-06 DIAGNOSIS — I700 Atherosclerosis of aorta: Secondary | ICD-10-CM | POA: Diagnosis not present

## 2023-01-06 DIAGNOSIS — Z7189 Other specified counseling: Secondary | ICD-10-CM | POA: Diagnosis not present

## 2023-01-06 DIAGNOSIS — M109 Gout, unspecified: Secondary | ICD-10-CM | POA: Diagnosis not present

## 2023-01-06 DIAGNOSIS — I10 Essential (primary) hypertension: Secondary | ICD-10-CM | POA: Diagnosis not present

## 2023-01-06 DIAGNOSIS — Z6837 Body mass index (BMI) 37.0-37.9, adult: Secondary | ICD-10-CM | POA: Diagnosis not present

## 2023-01-06 DIAGNOSIS — E1169 Type 2 diabetes mellitus with other specified complication: Secondary | ICD-10-CM | POA: Diagnosis not present

## 2023-01-06 DIAGNOSIS — Z1331 Encounter for screening for depression: Secondary | ICD-10-CM | POA: Diagnosis not present

## 2023-01-06 DIAGNOSIS — Z0000 Encounter for general adult medical examination without abnormal findings: Secondary | ICD-10-CM | POA: Diagnosis not present

## 2023-01-09 DIAGNOSIS — M9901 Segmental and somatic dysfunction of cervical region: Secondary | ICD-10-CM | POA: Diagnosis not present

## 2023-01-09 DIAGNOSIS — M9902 Segmental and somatic dysfunction of thoracic region: Secondary | ICD-10-CM | POA: Diagnosis not present

## 2023-01-09 DIAGNOSIS — M4727 Other spondylosis with radiculopathy, lumbosacral region: Secondary | ICD-10-CM | POA: Diagnosis not present

## 2023-01-09 DIAGNOSIS — M47814 Spondylosis without myelopathy or radiculopathy, thoracic region: Secondary | ICD-10-CM | POA: Diagnosis not present

## 2023-01-09 DIAGNOSIS — M9903 Segmental and somatic dysfunction of lumbar region: Secondary | ICD-10-CM | POA: Diagnosis not present

## 2023-01-09 DIAGNOSIS — M4722 Other spondylosis with radiculopathy, cervical region: Secondary | ICD-10-CM | POA: Diagnosis not present

## 2023-01-21 DIAGNOSIS — D225 Melanocytic nevi of trunk: Secondary | ICD-10-CM | POA: Diagnosis not present

## 2023-01-21 DIAGNOSIS — L578 Other skin changes due to chronic exposure to nonionizing radiation: Secondary | ICD-10-CM | POA: Diagnosis not present

## 2023-01-21 DIAGNOSIS — L820 Inflamed seborrheic keratosis: Secondary | ICD-10-CM | POA: Diagnosis not present

## 2023-01-21 DIAGNOSIS — L814 Other melanin hyperpigmentation: Secondary | ICD-10-CM | POA: Diagnosis not present

## 2023-02-20 DIAGNOSIS — M9901 Segmental and somatic dysfunction of cervical region: Secondary | ICD-10-CM | POA: Diagnosis not present

## 2023-02-20 DIAGNOSIS — M47894 Other spondylosis, thoracic region: Secondary | ICD-10-CM | POA: Diagnosis not present

## 2023-02-20 DIAGNOSIS — M9903 Segmental and somatic dysfunction of lumbar region: Secondary | ICD-10-CM | POA: Diagnosis not present

## 2023-02-20 DIAGNOSIS — M4726 Other spondylosis with radiculopathy, lumbar region: Secondary | ICD-10-CM | POA: Diagnosis not present

## 2023-02-20 DIAGNOSIS — M9902 Segmental and somatic dysfunction of thoracic region: Secondary | ICD-10-CM | POA: Diagnosis not present

## 2023-02-20 DIAGNOSIS — M47812 Spondylosis without myelopathy or radiculopathy, cervical region: Secondary | ICD-10-CM | POA: Diagnosis not present

## 2023-03-04 DIAGNOSIS — Z1211 Encounter for screening for malignant neoplasm of colon: Secondary | ICD-10-CM | POA: Diagnosis not present

## 2023-03-04 DIAGNOSIS — Z1212 Encounter for screening for malignant neoplasm of rectum: Secondary | ICD-10-CM | POA: Diagnosis not present

## 2023-03-24 DIAGNOSIS — M9903 Segmental and somatic dysfunction of lumbar region: Secondary | ICD-10-CM | POA: Diagnosis not present

## 2023-03-24 DIAGNOSIS — M47893 Other spondylosis, cervicothoracic region: Secondary | ICD-10-CM | POA: Diagnosis not present

## 2023-03-24 DIAGNOSIS — M4722 Other spondylosis with radiculopathy, cervical region: Secondary | ICD-10-CM | POA: Diagnosis not present

## 2023-03-24 DIAGNOSIS — M9901 Segmental and somatic dysfunction of cervical region: Secondary | ICD-10-CM | POA: Diagnosis not present

## 2023-03-24 DIAGNOSIS — M47817 Spondylosis without myelopathy or radiculopathy, lumbosacral region: Secondary | ICD-10-CM | POA: Diagnosis not present

## 2023-03-24 DIAGNOSIS — M9902 Segmental and somatic dysfunction of thoracic region: Secondary | ICD-10-CM | POA: Diagnosis not present

## 2023-04-02 DIAGNOSIS — M79606 Pain in leg, unspecified: Secondary | ICD-10-CM | POA: Diagnosis not present

## 2023-04-02 DIAGNOSIS — Z6837 Body mass index (BMI) 37.0-37.9, adult: Secondary | ICD-10-CM | POA: Diagnosis not present

## 2023-04-02 DIAGNOSIS — E1169 Type 2 diabetes mellitus with other specified complication: Secondary | ICD-10-CM | POA: Diagnosis not present

## 2023-04-02 DIAGNOSIS — I10 Essential (primary) hypertension: Secondary | ICD-10-CM | POA: Diagnosis not present

## 2023-04-02 DIAGNOSIS — E785 Hyperlipidemia, unspecified: Secondary | ICD-10-CM | POA: Diagnosis not present

## 2023-04-16 DIAGNOSIS — M79605 Pain in left leg: Secondary | ICD-10-CM | POA: Diagnosis not present

## 2023-04-21 DIAGNOSIS — M9903 Segmental and somatic dysfunction of lumbar region: Secondary | ICD-10-CM | POA: Diagnosis not present

## 2023-04-21 DIAGNOSIS — M47814 Spondylosis without myelopathy or radiculopathy, thoracic region: Secondary | ICD-10-CM | POA: Diagnosis not present

## 2023-04-21 DIAGNOSIS — M4727 Other spondylosis with radiculopathy, lumbosacral region: Secondary | ICD-10-CM | POA: Diagnosis not present

## 2023-04-21 DIAGNOSIS — M9902 Segmental and somatic dysfunction of thoracic region: Secondary | ICD-10-CM | POA: Diagnosis not present

## 2023-04-21 DIAGNOSIS — M5388 Other specified dorsopathies, sacral and sacrococcygeal region: Secondary | ICD-10-CM | POA: Diagnosis not present

## 2023-04-21 DIAGNOSIS — M9904 Segmental and somatic dysfunction of sacral region: Secondary | ICD-10-CM | POA: Diagnosis not present

## 2023-05-08 DIAGNOSIS — I10 Essential (primary) hypertension: Secondary | ICD-10-CM | POA: Diagnosis not present

## 2023-05-08 DIAGNOSIS — E1169 Type 2 diabetes mellitus with other specified complication: Secondary | ICD-10-CM | POA: Diagnosis not present

## 2023-05-08 DIAGNOSIS — Z79899 Other long term (current) drug therapy: Secondary | ICD-10-CM | POA: Diagnosis not present

## 2023-05-08 DIAGNOSIS — Z6837 Body mass index (BMI) 37.0-37.9, adult: Secondary | ICD-10-CM | POA: Diagnosis not present

## 2023-05-08 DIAGNOSIS — R5383 Other fatigue: Secondary | ICD-10-CM | POA: Diagnosis not present

## 2023-05-08 DIAGNOSIS — E785 Hyperlipidemia, unspecified: Secondary | ICD-10-CM | POA: Diagnosis not present

## 2023-05-08 DIAGNOSIS — R252 Cramp and spasm: Secondary | ICD-10-CM | POA: Diagnosis not present

## 2023-05-16 DIAGNOSIS — N390 Urinary tract infection, site not specified: Secondary | ICD-10-CM | POA: Diagnosis not present

## 2023-05-16 DIAGNOSIS — R310 Gross hematuria: Secondary | ICD-10-CM | POA: Diagnosis not present

## 2023-05-16 DIAGNOSIS — N952 Postmenopausal atrophic vaginitis: Secondary | ICD-10-CM | POA: Diagnosis not present

## 2023-05-20 DIAGNOSIS — E1169 Type 2 diabetes mellitus with other specified complication: Secondary | ICD-10-CM | POA: Diagnosis not present

## 2023-05-22 DIAGNOSIS — Z1231 Encounter for screening mammogram for malignant neoplasm of breast: Secondary | ICD-10-CM | POA: Diagnosis not present

## 2023-05-22 LAB — HM MAMMOGRAPHY

## 2023-05-27 NOTE — Progress Notes (Signed)
 [facility]  [address] [place],  [place]  [address] [phone]   Clinic Day:05/28/23   Referring physician: Olan Bering, MD    CHIEF COMPLAINT:  CC: History of stage 0 breast cancer   Current Treatment:  Surveillance   HISTORY OF PRESENT ILLNESS:  Autumn Carter is a 68 y.o. female with a history of stage 0 breast cancer diagnosed in December of 2010 and treated with a lumpectomy.  Pathology revealed a 1.6 cm grade 2 ductal carcinoma in situ with an area of focal necrosis for a Tis N0 M0.  Estrogen and progesterone receptors were positive and she was treated with postoperative radiation.  She declined chemoprevention and is on yearly follow-up.  Dr. Jonita Neth last did mammography in January of this year and has scheduled her next one for February 22, 2016. She had acupuncture yesterday which has helped her and she does have asthma with chronic dyspnea.  She has had cataract surgery.  She has not taken her blood pressure medication yet today.  She denies pain other than occasional aching of her legs.  She has had a hysterectomy in the past by Dr. Candida Chalk.  She did have BRCA testing which was negative.  Her family history is extensive for malignancy.  Her maternal aunt had breast cancer and a paternal aunt had breast cancer.  Her maternal grandmother had bilateral mastectomies for breast cancer at age 18 but still lived to be 4. A paternal great aunt had breast cancer.  Her paternal grandmother had a malignancy of her face but later died of colon cancer.  A paternal niece had breast cancer and had bilateral mastectomies and a paternal uncle had some form of intestinal malignancy as well as a grandfather with possible stomach cancer.  The patient 's mother has had gastrointestinal upset and had 20 polyps removed on her last colonoscopy.  We do not know whether these were hyperplastic or adenomatous.  Based on the patient 's personal and family history, she is a  candidate for update genetic testing by national guidelines, and this was done last year.  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  She had an injury to her lower right leg from a nail last year and apparently this became infected and she was on antibiotics.  In June of 2018, she had problems with shortness of breath and abnormal D-dimer, but a CT of the chest was negative.  An ultrasound for deep venous thrombosis was negative, and an echocardiogram was also normal.  Bilateral mammogram in February of 2019 was clear.  Dr. Kathyrn Parkinson sees her for regular follow-up and labs.  She is now retired.   INTERVAL HISTORY:  Autumn Carter is here for routine follow up for her history of stage 0 breast cancer, diagnosed in December of 2010 and treated with a lumpectomy. Patient states that she feels well and has no complaints of pain. She informed me that she was diagnosed with type 2 diabetes this year and is working on controlling her sugar with diet. She had a bilateral screening mammogram done on 05/22/2023 that was clear. Patient wanted me to look at an area on her upper sternum that has been itching and scaly. She sees the dermatologist annually and will see her again in December. During physical exam it appears to be an actinic keratosis. Patient informed me that she stopped a statin medication as she  began experiencing nipple discharge, excessive mucous from her sinuses, and one breast was "raised up". I will see her back in 1 year with screening bilateral mammogram. She denies signs of infection such as sore throat, sinus drainage, cough, or urinary symptoms.  She denies fevers or recurrent chills. She denies pain. She denies nausea, vomiting, chest pain, dyspnea or cough. Her appetite is good and her weight has increased 7 pounds over last year .    REVIEW OF SYSTEMS:  Review of Systems  Constitutional: Negative.  Negative  for appetite change, chills, diaphoresis, fatigue, fever and unexpected weight change.  HENT:  Negative.  Negative for hearing loss, lump/mass, mouth sores, nosebleeds, sore throat, tinnitus, trouble swallowing and voice change.   Eyes: Negative.  Negative for eye problems and icterus.  Respiratory: Negative.  Negative for chest tightness, cough, hemoptysis, shortness of breath and wheezing.   Cardiovascular: Negative.  Negative for chest pain, leg swelling and palpitations.  Gastrointestinal: Negative.  Negative for abdominal distention, abdominal pain, blood in stool, constipation, diarrhea, nausea, rectal pain and vomiting.  Endocrine: Negative.   Genitourinary: Negative.  Negative for bladder incontinence, difficulty urinating, dyspareunia, dysuria, frequency, hematuria, menstrual problem, nocturia, pelvic pain, vaginal bleeding and vaginal discharge.   Musculoskeletal:  Positive for arthralgias (Gout). Negative for back pain, flank pain, gait problem, myalgias, neck pain and neck stiffness.  Skin:  Positive for itching (ichy lesions of the skin). Negative for rash and wound.  Neurological:  Negative for dizziness, extremity weakness, gait problem, headaches, light-headedness, numbness, seizures and speech difficulty.  Hematological: Negative.  Negative for adenopathy. Does not bruise/bleed easily.  Psychiatric/Behavioral: Negative.  Negative for confusion, decreased concentration, depression, sleep disturbance and suicidal ideas. The patient is not nervous/anxious.    VITALS:   Vitals:   05/28/23 0946 05/28/23 1021  BP: (!) 152/89 (!) 140/94  Pulse: 94   Resp: 18   Temp: 97.6 F (36.4 C)   SpO2: 98%    Wt Readings from Last 3 Encounters:  05/28/23 194 lb 9.6 oz (88.3 kg)  12/04/22 196 lb 9.6 oz (89.2 kg)  06/03/22 187 lb 6.4 oz (85 kg)  Performance status (ECOG): 0 - Asymptomatic   PHYSICAL EXAM:  Physical Exam Vitals and nursing note reviewed.  Constitutional:      General:  She is not in acute distress.    Appearance: Normal appearance. She is normal weight. She is not ill-appearing, toxic-appearing or diaphoretic.  HENT:     Head: Normocephalic and atraumatic.     Right Ear: Tympanic membrane, ear canal and external ear normal. There is no impacted cerumen.     Left Ear: Ear canal and external ear normal. There is no impacted cerumen.     Nose: Nose normal. No congestion or rhinorrhea.     Mouth/Throat:     Mouth: Mucous membranes are moist.     Pharynx: Oropharynx is clear. No oropharyngeal exudate or posterior oropharyngeal erythema.  Eyes:     General: No scleral icterus.       Right eye: No discharge.        Left eye: No discharge.     Extraocular Movements: Extraocular movements intact.     Conjunctiva/sclera: Conjunctivae normal.     Pupils: Pupils are equal, round, and reactive to light.  Neck:     Vascular: No carotid bruit.  Cardiovascular:     Rate and Rhythm: Normal rate and regular rhythm.     Pulses: Normal pulses.     Heart  sounds: Normal heart sounds. No murmur heard.    No friction rub. No gallop.  Pulmonary:     Effort: Pulmonary effort is normal. No respiratory distress.     Breath sounds: Normal breath sounds. No stridor. No wheezing, rhonchi or rales.  Chest:     Chest wall: No tenderness.  Breasts:    Right: Inverted nipple present.     Comments: Deep scar in the upper inner quadrant of the left breast which is well healed. This breast is smaller Inversion of the bilateral with no discharge. No masses in either breast.  Abdominal:     General: Bowel sounds are normal. There is no distension.     Palpations: Abdomen is soft. There is no hepatomegaly, splenomegaly or mass.     Tenderness: There is no abdominal tenderness. There is no right CVA tenderness, left CVA tenderness, guarding or rebound.     Hernia: No hernia is present.  Musculoskeletal:        General: No swelling, tenderness, deformity or signs of injury. Normal  range of motion.     Cervical back: Normal range of motion and neck supple. No rigidity or tenderness.     Right lower leg: No edema.     Left lower leg: No edema.  Lymphadenopathy:     Cervical: No cervical adenopathy.     Right cervical: No superficial, deep or posterior cervical adenopathy.    Left cervical: No superficial, deep or posterior cervical adenopathy.     Upper Body:     Right upper body: No supraclavicular, axillary or pectoral adenopathy.     Left upper body: No supraclavicular, axillary or pectoral adenopathy.  Skin:    General: Skin is warm and dry.     Coloration: Skin is not jaundiced or pale.     Findings: Lesion present. No bruising, erythema or rash.     Comments: Raised scaly area in the mid upper sternum that looks consistent with keratosis and another one on her posterior shoulder Well healed scar in the upper left arm at the lateral aspect  Neurological:     General: No focal deficit present.     Mental Status: She is alert and oriented to person, place, and time. Mental status is at baseline.     Cranial Nerves: No cranial nerve deficit.     Sensory: No sensory deficit.     Motor: No weakness.     Coordination: Coordination normal.     Gait: Gait normal.     Deep Tendon Reflexes: Reflexes normal.  Psychiatric:        Mood and Affect: Mood normal.        Behavior: Behavior normal.        Thought Content: Thought content normal.        Judgment: Judgment normal.     LABS:  No results found for: "WBC", "HGB", "HCT", "MCV", "PLT" Lab Results  Component Value Date   CREATININE 0.72 01/16/2022   BUN 11 01/16/2022   NA 139 01/16/2022   K 4.2 01/16/2022   CL 104 01/16/2022   CO2 22 01/16/2022   No results found for: "PROT", "ALBUMIN", "AST", "ALT", "ALKPHOS", "BILITOT", "BILIDIR", "IBILI"     STUDIES:  Exam: 05/22/2023 Digital Bilateral Screening Mammogram Impression:  No mammographic evidence of malignancy      Allergies  Allergen Reactions    Lansoprazole Swelling and Other (See Comments)    Stomach pain   Pantoprazole Other (See Comments)    Stomach  pain, excess nasal mucus   Zestril [Lisinopril] Anaphylaxis and Other (See Comments)    Heart attack symptoms   Ace Inhibitors Other (See Comments) and Swelling   Allopurinol Nausea And Vomiting   Amlodipine Other (See Comments)    Abdominal pain.   Amlodipine Besylate Other (See Comments)   Amoxicillin Other (See Comments)   Arnuity Ellipta  [Fluticasone  Furoate] Other (See Comments)    Eye issues   Aspirin Other (See Comments)   Beclomethasone Nausea Only and Other (See Comments)    Runny nose   Beta Adrenergic Blockers     Eye Infections.    Cephalexin    Cephalosporins Other (See Comments)    Unknown   Clindamycin/Lincomycin    Clotrimazole      Regurgitation    Codeine Other (See Comments)   Codeine Sulfate    Covid-19 Ad26 Vaccine(Janssen)    Doxazosin Other (See Comments)    pain   Flovent Hfa [Fluticasone ]     Heartburn   Hydralazine Hcl    Hydrochlorothiazide     Kidney swelling   Levaquin [Levofloxacin In D5w]    Macrobid [Nitrofurantoin Monohyd Macro]    Metoprolol  Tartrate    Molnupiravir Other (See Comments)    Vomiting   Montelukast  Sodium Other (See Comments)    heartburn  Other reaction(s): GI Upset (intolerance)   Morphine And Codeine Other (See Comments)    hallucinations     Moxifloxacin Hcl     Pain in arms and right wrist   Naltrexone Nausea Only and Other (See Comments)   Nasonex  [Mometasone  Furoate]    Nifedipine Other (See Comments)    Headache    Nitrofurantoin Cough    Cough   Olmesartan     Olmesartan Medoxomil: trembling, stomach upset   Olmesartan Medoxomil-Hctz Swelling and Other (See Comments)    Tongue and Lip swelling  Other reaction(s): Other (See Comments)  Unknown  Tongue and Lip swelling, Unknown   Other Other (See Comments)   Prednisone     High dose = Increase in GERD.   Propoxyphene Other (See  Comments)    Unknown  Unknown, Unknown   Pulmicort [Budesonide]     Body ached all over---took over the counter:  Equate nasal spray form of this medication per patient '   Ranitidine Itching and Swelling   Silvadene [Silver Sulfadiazine]     Didn't feel well   Spironolactone Other (See Comments)    Worsened GERD   Sulfa Antibiotics    Sulfamethoxazole Other (See Comments)   Tobramycin Other (See Comments)    Eye redness, cheek redness, breast mucus leakage  Other reaction(s): Flushing (ALLERGY/intolerance)   Voltaren [Diclofenac] Swelling and Other (See Comments)    Asthma   Zofran  [Ondansetron ]     Constipation     Current Medications:       Current Outpatient Medications  Medication Sig Dispense Refill   Albuterol  Sulfate (PROAIR  HFA IN) Inhale into the lungs as needed.       ASMANEX  60 METERED DOSES 220 MCG/INH inhaler INHALE 2 PUFFS IN THE EVENING ONCE A DAY   2   calcium citrate-vitamin D (CITRACAL+D) 315-200 MG-UNIT tablet Take 1 tablet by mouth daily.       cloNIDine (CATAPRES - DOSED IN MG/24 HR) 0.2 mg/24hr patch APPLY 1 PATCH TO SKIN ONCE PER WEEK TRANSDERMAL 90 DAYS   1   dexlansoprazole (DEXILANT) 60 MG capsule Take 60 mg by mouth daily.       diazepam (VALIUM) 5 MG  tablet Take 2.5-5 mg by mouth 2 (two) times daily as needed for anxiety (sleep).        levalbuterol (XOPENEX) 1.25 MG/3ML nebulizer solution INHALE 3MLS EVERY 8 HOURS       montelukast  (SINGULAIR ) 10 MG tablet Take 10 mg by mouth at bedtime.       nystatin  (MYCOSTATIN ) 100000 UNIT/ML suspension Swish and swallow 5mL three times daily as directed 450 mL 1   ondansetron  (ZOFRAN -ODT) 4 MG disintegrating tablet Take 4 mg by mouth 3 (three) times daily as needed for nausea or vomiting.       Probiotic Product (ALIGN PO) Take 1 tablet by mouth daily.       tiZANidine (ZANAFLEX) 2 MG tablet Take 2 mg by mouth every 8 (eight) hours as needed for muscle spasms.       traMADol (ULTRAM) 50 MG tablet TAKE 1  TABLET BY MOUTH EVERY 6 HOURS AS NEEDED FOR 5 DAYS   0   valsartan (DIOVAN) 320 MG tablet Take 320 mg by mouth daily.       Vitamin D, Cholecalciferol, 1000 units TABS Take by mouth daily.        No current facility-administered medications for this visit.     ASSESSMENT & PLAN:  Assessment:   1.  Stage 0 breast cancer, diagnosed in December 2010.  She remains without evidence of recurrence.   Plan: She informed me that she was diagnosed with type 2 diabetes this year and is working on controlling her sugar with diet. She had a bilateral screening mammogram done on 05/22/2023 that was clear. Patient wanted me to look at an area on her upper sternum that has been itching and scaly. She sees the dermatologist annually and will see her again in December. During physical exam it appears to be an actinic keratosis. Patient informed me that she stopped a statin medication as she began experiencing nipple discharge, excessive mucous from her sinuses, and one breast was "raised up". I will see her back in 1 year with screening bilateral mammogram.  She understands and agrees with this plan of care.    I provided 15 minutes of face-to-face time during this this encounter and > 50% was spent counseling as documented under my assessment and plan.    Nolia Baumgartner, MD  [facility] OF [facility] [address] [place] [address] Dept: [phone] Dept Fax: [phone]    No orders of the defined types were placed in this encounter.    I,Jasmine M Lassiter,acting as a scribe for Nolia Baumgartner, MD.,have documented all relevant documentation on the behalf of Nolia Baumgartner, MD,as directed by  Nolia Baumgartner, MD while in the presence of Nolia Baumgartner, MD.  I have reviewed this report as typed by the medical scribe, and it is complete and accurate.

## 2023-05-28 ENCOUNTER — Telehealth: Payer: Self-pay | Admitting: Oncology

## 2023-05-28 ENCOUNTER — Other Ambulatory Visit: Payer: Self-pay | Admitting: Oncology

## 2023-05-28 ENCOUNTER — Encounter: Payer: Self-pay | Admitting: Oncology

## 2023-05-28 ENCOUNTER — Inpatient Hospital Stay: Payer: PPO | Attending: Oncology | Admitting: Oncology

## 2023-05-28 VITALS — BP 140/94 | HR 94 | Temp 97.6°F | Resp 18 | Ht 60.2 in | Wt 194.6 lb

## 2023-05-28 DIAGNOSIS — Z1239 Encounter for other screening for malignant neoplasm of breast: Secondary | ICD-10-CM

## 2023-05-28 DIAGNOSIS — Z853 Personal history of malignant neoplasm of breast: Secondary | ICD-10-CM | POA: Insufficient documentation

## 2023-05-28 DIAGNOSIS — Z170 Estrogen receptor positive status [ER+]: Secondary | ICD-10-CM

## 2023-05-28 DIAGNOSIS — C50212 Malignant neoplasm of upper-inner quadrant of left female breast: Secondary | ICD-10-CM

## 2023-05-28 NOTE — Telephone Encounter (Signed)
 Patient has been scheduled for follow-up visit per 05/28/23 LOS.  Pt given an appt calendar with date and time.

## 2023-05-29 DIAGNOSIS — M9904 Segmental and somatic dysfunction of sacral region: Secondary | ICD-10-CM | POA: Diagnosis not present

## 2023-05-29 DIAGNOSIS — M47894 Other spondylosis, thoracic region: Secondary | ICD-10-CM | POA: Diagnosis not present

## 2023-05-29 DIAGNOSIS — M47817 Spondylosis without myelopathy or radiculopathy, lumbosacral region: Secondary | ICD-10-CM | POA: Diagnosis not present

## 2023-05-29 DIAGNOSIS — M47816 Spondylosis without myelopathy or radiculopathy, lumbar region: Secondary | ICD-10-CM | POA: Diagnosis not present

## 2023-05-29 DIAGNOSIS — M9902 Segmental and somatic dysfunction of thoracic region: Secondary | ICD-10-CM | POA: Diagnosis not present

## 2023-05-29 DIAGNOSIS — M9903 Segmental and somatic dysfunction of lumbar region: Secondary | ICD-10-CM | POA: Diagnosis not present

## 2023-06-03 DIAGNOSIS — D485 Neoplasm of uncertain behavior of skin: Secondary | ICD-10-CM | POA: Diagnosis not present

## 2023-06-03 DIAGNOSIS — L578 Other skin changes due to chronic exposure to nonionizing radiation: Secondary | ICD-10-CM | POA: Diagnosis not present

## 2023-06-03 DIAGNOSIS — L820 Inflamed seborrheic keratosis: Secondary | ICD-10-CM | POA: Diagnosis not present

## 2023-06-03 DIAGNOSIS — L821 Other seborrheic keratosis: Secondary | ICD-10-CM | POA: Diagnosis not present

## 2023-06-04 ENCOUNTER — Telehealth: Payer: Self-pay | Admitting: Allergy and Immunology

## 2023-06-04 ENCOUNTER — Ambulatory Visit: Payer: PPO | Admitting: Allergy and Immunology

## 2023-06-04 VITALS — BP 138/78 | HR 100 | Temp 97.7°F | Resp 20

## 2023-06-04 DIAGNOSIS — B370 Candidal stomatitis: Secondary | ICD-10-CM

## 2023-06-04 DIAGNOSIS — J441 Chronic obstructive pulmonary disease with (acute) exacerbation: Secondary | ICD-10-CM | POA: Diagnosis not present

## 2023-06-04 DIAGNOSIS — J3089 Other allergic rhinitis: Secondary | ICD-10-CM | POA: Diagnosis not present

## 2023-06-04 DIAGNOSIS — K219 Gastro-esophageal reflux disease without esophagitis: Secondary | ICD-10-CM | POA: Diagnosis not present

## 2023-06-04 MED ORDER — DOXYCYCLINE HYCLATE 100 MG PO TABS
100.0000 mg | ORAL_TABLET | Freq: Two times a day (BID) | ORAL | 0 refills | Status: AC
Start: 2023-06-04 — End: 2023-06-11

## 2023-06-04 MED ORDER — METHYLPREDNISOLONE ACETATE 80 MG/ML IJ SUSP
80.0000 mg | Freq: Once | INTRAMUSCULAR | Status: AC
  Administered 2023-06-04: 80 mg via INTRAMUSCULAR

## 2023-06-04 MED ORDER — ALBUTEROL SULFATE HFA 108 (90 BASE) MCG/ACT IN AERS: INHALATION_SPRAY | RESPIRATORY_TRACT | 1 refills | Status: AC

## 2023-06-04 NOTE — Patient Instructions (Signed)
  1. Continue to Treat inflammation:   A. Asmanex  220 - 1-2 inhalations 1-2 times per day (MAX=4/day)   B. Nasacort  - 1 spray each nostril 3-7 times per week   2. Continue to Treat reflux:   A.  Generic Dexilant 60 mg 3-7 times per week.    3.  If needed:   A.  Nystatin  oral solution-5 mL after Asmanex  - rinse and spit  B.  Albuterol  HFA  C.  Antihistamine  D.  Nasal saline  4. For this most recent event:   A. Check COMBO flu / Covid swab. Report back with results  B. Doxycycline 100 mg - 1 tablet 2 times per day for 7 days  C. Depomedrol 80 IM delivered in clinic today  D. Further treatment???  5. Return to clinic in 6 months or earlier if problem.

## 2023-06-04 NOTE — Telephone Encounter (Signed)
 Patient states she has swabbed for Flu/COVID and it was negative.

## 2023-06-04 NOTE — Progress Notes (Signed)
 [place] - [place] - [place] - [place] - [place]   Follow-up Note  Referring Provider: Olan Bering, MD Primary Provider: Olan Bering, MD Date of Office Visit: 06/04/2023  Subjective:   Autumn Carter (DOB: February 13, 1955) is a 68 y.o. female who [facility] in re-evaluation of the following:  HPI: Letosha returns to this clinic in evaluation of COPD/asthma overlap, LPR, allergic rhinitis.  I last saw her in this clinic 04 December 2022.  She was doing quite well with her airway issue and rarely used the short acting bronchodilator and apparently did not require a systemic steroid or an antibiotic for any type of airway issue.  She would continue to use Asmanex  and Nasacort  on a consistent basis.  However, about 4 days ago she developed weakness and shortness of breath and a temperature of 99 and then she got chills and feeling hot and just feeling bad and stuffiness of her nose and fullness of her head and maybe some anosmia.  She did not perform any viral swabs.  And, about 3 weeks ago, she was bit by a tick on her abdomen and that has swelled up and turned red.  She saw dermatology about this issue yesterday but no specific therapy was administered.  She believes her reflux is doing pretty well at this point.  She still uses nystatin  intermittently.   Allergies as of 06/04/2023       Reactions   Lansoprazole Swelling, Other (See Comments)   Stomach pain   Pantoprazole Other (See Comments)   Stomach pain, excess nasal mucus   Zestril [lisinopril] Anaphylaxis, Other (See Comments)   Heart attack symptoms   Ace Inhibitors Other (See Comments), Swelling   Allopurinol Nausea And Vomiting   Amlodipine Other (See Comments)   Abdominal pain.   Amlodipine Besylate Other (See Comments)   Amoxicillin Other (See Comments)   Arnuity Ellipta  [fluticasone  Furoate] Other (See Comments)   Eye issues   Aspirin Other (See Comments)    Beclomethasone Nausea Only, Other (See Comments)   Runny nose   Beta Adrenergic Blockers    Eye Infections.    Cephalexin    Cephalosporins Other (See Comments)   Unknown   Clindamycin/lincomycin    Clotrimazole     Regurgitation   Codeine Other (See Comments)   Codeine Sulfate    Covid-19 Ad26 Vaccine(janssen)    Doxazosin Other (See Comments)   pain   Flovent Hfa [fluticasone ]    Heartburn   Hydralazine Hcl    Hydrochlorothiazide    Kidney swelling   Levaquin [levofloxacin In D5w]    Macrobid [nitrofurantoin Monohyd Macro]    Metoprolol  Tartrate    Molnupiravir Other (See Comments)   Vomiting   Montelukast  Sodium Other (See Comments)   heartburn Other reaction(s): GI Upset (intolerance)   Morphine And Codeine Other (See Comments)   hallucinations     Moxifloxacin Hcl    Pain in arms and right wrist   Naltrexone Nausea Only, Other (See Comments)   Nasonex  [mometasone  Furoate]    Nifedipine Other (See Comments)   Headache   Nitrofurantoin Cough   Cough   Olmesartan    Olmesartan Medoxomil: trembling, stomach upset   Olmesartan Medoxomil-hctz Swelling, Other (See Comments)   Tongue and Lip swelling Other reaction(s): Other (See Comments) Unknown Tongue and Lip swelling, Unknown   Other Other (See Comments)   Prednisone    High dose = Increase in GERD.   Propoxyphene Other (See  Comments)   Unknown Unknown, Unknown   Pulmicort [budesonide]    Body ached all over---took over the counter:  Equate nasal spray form of this medication per patient '   Ranitidine Itching, Swelling   Silvadene [silver Sulfadiazine]    Didn't feel well   Spironolactone Other (See Comments)   Worsened GERD   Sulfa Antibiotics    Sulfamethoxazole Other (See Comments)   Tobramycin Other (See Comments)   Eye redness, cheek redness, breast mucus leakage Other reaction(s): Flushing (ALLERGY/intolerance)   Voltaren [diclofenac] Swelling, Other (See Comments)   Asthma   Zofran   [ondansetron ]    Constipation        Medication List    albuterol  108 (90 Base) MCG/ACT inhaler Commonly known as: VENTOLIN  HFA Inhale 2 puffs every 4-6 hours as needed for cough, wheeze, tightness in chest, or shortness of breath   ALIGN PO Take 1 tablet by mouth daily.   Asmanex  (60 Metered Doses) 220 MCG/INH inhaler Generic drug: mometasone  Inhale 2 puffs into the lungs at bedtime.   CALCIUM 500 PO Take by mouth 2 (two) times daily.   ciclopirox 8 % solution Commonly known as: PENLAC Apply topically.   clindamycin 1 % external solution Commonly known as: CLEOCIN T Apply topically 2 (two) times daily.   cloNIDine 0.2 mg/24hr patch Commonly known as: CATAPRES - Dosed in mg/24 hr APPLY 1 PATCH TO SKIN ONCE PER WEEK TRANSDERMAL 90 DAYS   colchicine 0.6 MG tablet Take 0.6 mg by mouth daily.   D-Mannose 500 MG Caps Take by mouth every other day.   dexlansoprazole 60 MG capsule Commonly known as: DEXILANT Take 60 mg by mouth every other day.   diazepam 5 MG tablet Commonly known as: VALIUM Take 2.5-5 mg by mouth 2 (two) times daily as needed for anxiety (sleep).   doxycycline  100 MG tablet Commonly known as: VIBRA -TABS Take 1 tablet (100 mg total) by mouth 2 (two) times daily for 7 days.   glimepiride 1 MG tablet Commonly known as: AMARYL Take 1 mg by mouth every morning.   JUICE PLUS FIBRE PO Take by mouth daily.   montelukast  10 MG tablet Commonly known as: SINGULAIR  Take 10 mg by mouth at bedtime.   Nasacort  Allergy 24HR 55 MCG/ACT Aero nasal inhaler Generic drug: triamcinolone  Place 1 spray into the nose 2 (two) times daily.   nystatin  100000 UNIT/ML suspension Commonly known as: MYCOSTATIN  5 mL after Asmanex  - rinse and spit   prochlorperazine 5 MG tablet Commonly known as: COMPAZINE Take 5 mg by mouth every 8 (eight) hours as needed for nausea or vomiting.   traMADol 50 MG tablet Commonly known as: ULTRAM Take 50 mg by mouth every 6  (six) hours as needed.   valsartan 320 MG tablet Commonly known as: DIOVAN Take 320 mg by mouth daily.   Vitamin D (Cholecalciferol) 25 MCG (1000 UT) Tabs Take by mouth daily.   VOLTAREN EX Apply topically as needed.   Zofran  4 MG tablet Generic drug: ondansetron  Take 4 mg by mouth every 8 (eight) hours as needed for nausea or vomiting.    Past Medical History:  Diagnosis Date   Allergic rhinitis    Anxiety    on meds   Arthritis    generalized   Asthma    uses inhaler/inhaler PRN   Breast cancer (HCC) 2010   LEFT   Cataract 2021   bilateral sx   Chronic kidney disease    hx of kidney stones  Dry eye    Family history of colon cancer    Family history of colonic polyps    Fatty liver    Gastric polyp    GERD (gastroesophageal reflux disease)    on meds   Gout    History of breast cancer    History of colon polyps    History of kidney stones    History of shingles    Hypertension    on meds   IBS (irritable bowel syndrome)    with constipation and diarrhea   Internal hemorrhage    Migraine    Pneumonia    PUD (peptic ulcer disease)    Rosacea    Seasonal allergies    UTI (urinary tract infection)     Past Surgical History:  Procedure Laterality Date   ABDOMINAL HYSTERECTOMY     BREAST LUMPECTOMY Left 01/30/2009   And radiotherapy   CATARACT EXTRACTION     CHOLECYSTECTOMY     COLONOSCOPY  2012   Gupta-Diverticulosis of colon. Internal hemorrhoids.   ESOPHAGOGASTRODUODENOSCOPY  05/10/2013   Small hiatal hernia. Incidental gastric polyps (status post polypectomy x3). Mild gastritis   KNEE SURGERY Right    s/p arthroscopic surgery   LAPAROSCOPIC HYSTERECTOMY  12/04/2009   Complete with bladder tack   NOSE SURGERY  1976   WISDOM TOOTH EXTRACTION      Review of systems negative except as noted in HPI / PMHx or noted below:  Review of Systems  Constitutional: Negative.   HENT: Negative.    Eyes: Negative.   Respiratory: Negative.     Cardiovascular: Negative.   Gastrointestinal: Negative.   Genitourinary: Negative.   Musculoskeletal: Negative.   Skin: Negative.   Neurological: Negative.   Endo/Heme/Allergies: Negative.   Psychiatric/Behavioral: Negative.       Objective:   Vitals:   06/04/23 0854  BP: 138/78  Pulse: 100  Resp: 20  Temp: 97.7 F (36.5 C)  SpO2: 95%          Physical Exam Constitutional:      Appearance: She is not diaphoretic.  HENT:     Head: Normocephalic.     Right Ear: Tympanic membrane, ear canal and external ear normal.     Left Ear: Tympanic membrane, ear canal and external ear normal.     Nose: Nose normal. No mucosal edema or rhinorrhea.     Mouth/Throat:     Pharynx: Uvula midline. No oropharyngeal exudate.  Eyes:     Conjunctiva/sclera: Conjunctivae normal.  Neck:     Thyroid : No thyromegaly.     Trachea: Trachea normal. No tracheal tenderness or tracheal deviation.  Cardiovascular:     Rate and Rhythm: Normal rate and regular rhythm.     Heart sounds: Normal heart sounds, S1 normal and S2 normal. No murmur heard. Pulmonary:     Effort: No respiratory distress.     Breath sounds: Normal breath sounds. No stridor. No wheezing or rales.  Lymphadenopathy:     Head:     Right side of head: No tonsillar adenopathy.     Left side of head: No tonsillar adenopathy.     Cervical: No cervical adenopathy.  Skin:    Findings: Rash (Approximately 5 cm diameter erythematous lesion with central tick bite ulcer.) present. No erythema.     Nails: There is no clubbing.  Neurological:     Mental Status: She is alert.     Diagnostics: Spirometry was performed and demonstrated an FEV1 of 1.05 at 54 %  of predicted.  Assessment and Plan:   1. Asthma with COPD with exacerbation (HCC)   2. Other allergic rhinitis   3. Thrush   4. LPRD (laryngopharyngeal reflux disease)    1. Continue to Treat inflammation:   A. Asmanex  220 - 1-2 inhalations 1-2 times per day (MAX=4/day)    B. Nasacort  - 1 spray each nostril 3-7 times per week   2. Continue to Treat reflux:   A.  Generic Dexilant 60 mg 3-7 times per week.    3.  If needed:   A.  Nystatin  oral solution-5 mL after Asmanex  - rinse and spit  B.  Albuterol  HFA  C.  Antihistamine  D.  Nasal saline  4. For this most recent event:   A. Check COMBO flu / Covid swab. Report back with results  B. Doxycycline  100 mg - 1 tablet 2 times per day for 7 days  C. Depomedrol 80 IM delivered in clinic today  D. Further treatment???  5. Return to clinic in 6 months or earlier if problem.    Nakai appears to have some form of viral syndrome but this also occurs in the context of being bit by a tick and developing a localized cutaneous reaction and we are going to give her doxycycline  to address this issue and I have given her some systemic steroids to treat with the inflammatory component of her respiratory tract disease.  I have asked her to obtain a flu and COVID swab and report back with the results today and we will give her appropriate therapy for either 1 of those infections should they be positive.  She will continue to use anti-inflammatory agents for her airway and therapy directed against reflux as noted above.  If she does well I will see her back in this clinic in 6 months or earlier if there is a problem.   Schuyler Custard, MD Allergy / Immunology [facility]

## 2023-06-05 ENCOUNTER — Encounter: Payer: Self-pay | Admitting: Allergy and Immunology

## 2023-06-05 ENCOUNTER — Encounter: Payer: Self-pay | Admitting: Oncology

## 2023-06-16 DIAGNOSIS — Z6836 Body mass index (BMI) 36.0-36.9, adult: Secondary | ICD-10-CM | POA: Diagnosis not present

## 2023-06-16 DIAGNOSIS — E1169 Type 2 diabetes mellitus with other specified complication: Secondary | ICD-10-CM | POA: Diagnosis not present

## 2023-07-02 DIAGNOSIS — M9903 Segmental and somatic dysfunction of lumbar region: Secondary | ICD-10-CM | POA: Diagnosis not present

## 2023-07-02 DIAGNOSIS — M9904 Segmental and somatic dysfunction of sacral region: Secondary | ICD-10-CM | POA: Diagnosis not present

## 2023-07-02 DIAGNOSIS — M47816 Spondylosis without myelopathy or radiculopathy, lumbar region: Secondary | ICD-10-CM | POA: Diagnosis not present

## 2023-07-02 DIAGNOSIS — M47817 Spondylosis without myelopathy or radiculopathy, lumbosacral region: Secondary | ICD-10-CM | POA: Diagnosis not present

## 2023-07-02 DIAGNOSIS — M9902 Segmental and somatic dysfunction of thoracic region: Secondary | ICD-10-CM | POA: Diagnosis not present

## 2023-07-02 DIAGNOSIS — M47895 Other spondylosis, thoracolumbar region: Secondary | ICD-10-CM | POA: Diagnosis not present

## 2023-07-31 DIAGNOSIS — M47812 Spondylosis without myelopathy or radiculopathy, cervical region: Secondary | ICD-10-CM | POA: Diagnosis not present

## 2023-07-31 DIAGNOSIS — M5388 Other specified dorsopathies, sacral and sacrococcygeal region: Secondary | ICD-10-CM | POA: Diagnosis not present

## 2023-07-31 DIAGNOSIS — M9904 Segmental and somatic dysfunction of sacral region: Secondary | ICD-10-CM | POA: Diagnosis not present

## 2023-07-31 DIAGNOSIS — M9902 Segmental and somatic dysfunction of thoracic region: Secondary | ICD-10-CM | POA: Diagnosis not present

## 2023-07-31 DIAGNOSIS — M9901 Segmental and somatic dysfunction of cervical region: Secondary | ICD-10-CM | POA: Diagnosis not present

## 2023-07-31 DIAGNOSIS — M47813 Spondylosis without myelopathy or radiculopathy, cervicothoracic region: Secondary | ICD-10-CM | POA: Diagnosis not present

## 2023-08-28 DIAGNOSIS — M9904 Segmental and somatic dysfunction of sacral region: Secondary | ICD-10-CM | POA: Diagnosis not present

## 2023-08-28 DIAGNOSIS — M47813 Spondylosis without myelopathy or radiculopathy, cervicothoracic region: Secondary | ICD-10-CM | POA: Diagnosis not present

## 2023-08-28 DIAGNOSIS — M9901 Segmental and somatic dysfunction of cervical region: Secondary | ICD-10-CM | POA: Diagnosis not present

## 2023-08-28 DIAGNOSIS — M5388 Other specified dorsopathies, sacral and sacrococcygeal region: Secondary | ICD-10-CM | POA: Diagnosis not present

## 2023-08-28 DIAGNOSIS — M47812 Spondylosis without myelopathy or radiculopathy, cervical region: Secondary | ICD-10-CM | POA: Diagnosis not present

## 2023-08-28 DIAGNOSIS — M9902 Segmental and somatic dysfunction of thoracic region: Secondary | ICD-10-CM | POA: Diagnosis not present

## 2023-09-11 DIAGNOSIS — Z853 Personal history of malignant neoplasm of breast: Secondary | ICD-10-CM | POA: Diagnosis not present

## 2023-09-11 DIAGNOSIS — Z6836 Body mass index (BMI) 36.0-36.9, adult: Secondary | ICD-10-CM | POA: Diagnosis not present

## 2023-09-11 DIAGNOSIS — Z79899 Other long term (current) drug therapy: Secondary | ICD-10-CM | POA: Diagnosis not present

## 2023-09-11 DIAGNOSIS — E785 Hyperlipidemia, unspecified: Secondary | ICD-10-CM | POA: Diagnosis not present

## 2023-09-11 DIAGNOSIS — I10 Essential (primary) hypertension: Secondary | ICD-10-CM | POA: Diagnosis not present

## 2023-09-11 DIAGNOSIS — E1169 Type 2 diabetes mellitus with other specified complication: Secondary | ICD-10-CM | POA: Diagnosis not present

## 2023-09-11 DIAGNOSIS — G729 Myopathy, unspecified: Secondary | ICD-10-CM | POA: Diagnosis not present

## 2023-09-24 DIAGNOSIS — M47817 Spondylosis without myelopathy or radiculopathy, lumbosacral region: Secondary | ICD-10-CM | POA: Diagnosis not present

## 2023-09-24 DIAGNOSIS — M47816 Spondylosis without myelopathy or radiculopathy, lumbar region: Secondary | ICD-10-CM | POA: Diagnosis not present

## 2023-09-24 DIAGNOSIS — M9901 Segmental and somatic dysfunction of cervical region: Secondary | ICD-10-CM | POA: Diagnosis not present

## 2023-09-24 DIAGNOSIS — M542 Cervicalgia: Secondary | ICD-10-CM | POA: Diagnosis not present

## 2023-09-24 DIAGNOSIS — M9903 Segmental and somatic dysfunction of lumbar region: Secondary | ICD-10-CM | POA: Diagnosis not present

## 2023-09-24 DIAGNOSIS — M9904 Segmental and somatic dysfunction of sacral region: Secondary | ICD-10-CM | POA: Diagnosis not present

## 2023-10-08 ENCOUNTER — Encounter: Payer: Self-pay | Admitting: Allergy and Immunology

## 2023-10-08 ENCOUNTER — Ambulatory Visit: Admitting: Allergy and Immunology

## 2023-10-08 VITALS — BP 138/76 | HR 108 | Resp 18

## 2023-10-08 DIAGNOSIS — J3089 Other allergic rhinitis: Secondary | ICD-10-CM | POA: Diagnosis not present

## 2023-10-08 DIAGNOSIS — K219 Gastro-esophageal reflux disease without esophagitis: Secondary | ICD-10-CM

## 2023-10-08 DIAGNOSIS — J4489 Other specified chronic obstructive pulmonary disease: Secondary | ICD-10-CM

## 2023-10-08 DIAGNOSIS — R110 Nausea: Secondary | ICD-10-CM

## 2023-10-08 DIAGNOSIS — J441 Chronic obstructive pulmonary disease with (acute) exacerbation: Secondary | ICD-10-CM

## 2023-10-08 MED ORDER — FAMOTIDINE 40 MG PO TABS: ORAL_TABLET | ORAL | 5 refills | Status: AC

## 2023-10-08 NOTE — Patient Instructions (Addendum)
  1. Continue to Treat inflammation:   A. Asmanex  220 - 1-2 inhalations 1-2 times per day (MAX=4/day)   B. Nasacort  - 1 spray each nostril 3-7 times per week   2. Continue to Treat reflux:   A. Generic Dexilant 60 mg 1 tablet 1 time per day in AM B. Famotidine  40 mg - 1 tablet 1 time per day in PM C. Eliminate any caffeine or chocolate consumption   D. Appointment with Dr. Charlanne for reflux and diarrhea E. Evaluation for food allergy???  3.  If needed:   A.  Nystatin  oral solution-5 mL after Asmanex  - rinse and spit  B.  Albuterol  HFA  C.  Antihistamine  D.  Nasal saline  4. Return to clinic in 4 weeks or earlier if problem.

## 2023-10-08 NOTE — Progress Notes (Unsigned)
 [place] - [place] - [place] - [place] - [place]   Follow-up Note  Referring Provider: Jefferey Fitch, MD Primary Provider: Jefferey Fitch, MD Date of Office Visit: 10/08/2023  Subjective:   Autumn Carter (DOB: [birth year]) is a 68 y.o. female who [facility]23 in re-evaluation of the following:  HPI: Autumn Carter returns to this clinic in evaluation of COPD/asthma overlap, LPR, allergic rhinitis.  I last saw her in this clinic 04 June 2023.  She has actually done pretty well with her airway issue and it does not sound as though she is required a systemic steroid or antibiotic for any type of airway problem while she continues to use Asmanex  and relatively low dose usually 220 mcg once a day and use some Nasacort .  Rarely does she need to use the short acting bronchodilator.  She does not really exert herself to any significant extent.  But she has been having a lot of GI issues recently.  She may have developed some form of gastroenteritis earlier this year but throughout the month of August 2025 she has had a collection of nausea and having nondiarrheal stools multiple times a day and gagging and some choking.  She has not been having any fever or significant abdominal pain or other GI symptoms or any associated systemic or constitutional symptoms.  She continues to use her Dexilant just a few times per week.  Allergies as of 10/08/2023       Reactions   Lansoprazole Swelling, Other (See Comments)   Stomach pain   Pantoprazole Other (See Comments)   Stomach pain, excess nasal mucus   Zestril [lisinopril] Anaphylaxis, Other (See Comments)   Heart attack symptoms   Ace Inhibitors Other (See Comments), Swelling   Allopurinol Nausea And Vomiting   Amlodipine Other (See Comments)   Abdominal pain.   Amlodipine Besylate Other (See Comments)   Amoxicillin Other (See Comments)   Arnuity Ellipta  [fluticasone  Furoate] Other (See Comments)    Eye issues   Aspirin Other (See Comments)   Beclomethasone Nausea Only, Other (See Comments)   Runny nose   Beta Adrenergic Blockers    Eye Infections.    Cephalexin    Cephalosporins Other (See Comments)   Unknown   Clindamycin/lincomycin    Clotrimazole     Regurgitation   Codeine Other (See Comments)   Codeine Sulfate    Covid-19 Ad26 Vaccine(janssen)    Doxazosin Other (See Comments)   pain   Flovent Hfa [fluticasone ]    Heartburn   Hydralazine Hcl    Hydrochlorothiazide    Kidney swelling   Levaquin [levofloxacin In D5w]    Macrobid [nitrofurantoin Monohyd Macro]    Metoprolol  Tartrate    Molnupiravir Other (See Comments)   Vomiting   Montelukast  Sodium Other (See Comments)   heartburn Other reaction(s): GI Upset (intolerance)   Morphine And Codeine Other (See Comments)   hallucinations     Moxifloxacin Hcl    Pain in arms and right wrist   Naltrexone Nausea Only, Other (See Comments)   Nasonex  [mometasone  Furoate]    Nifedipine Other (See Comments)   Headache   Nitrofurantoin Cough   Cough   Olmesartan    Olmesartan Medoxomil: trembling, stomach upset   Olmesartan Medoxomil-hctz Swelling, Other (See Comments)   Tongue and Lip swelling Other reaction(s): Other (See Comments) Unknown Tongue and Lip swelling, Unknown   Other Other (See Comments)   Prednisone    High dose = Increase in  GERD.   Propoxyphene Other (See Comments)   Unknown Unknown, Unknown   Pulmicort [budesonide]    Body ached all over---took over the counter:  Equate nasal spray form of this medication per patient '   Ranitidine Itching, Swelling   Silvadene [silver Sulfadiazine]    Didn't feel well   Spironolactone Other (See Comments)   Worsened GERD   Sulfa Antibiotics    Sulfamethoxazole Other (See Comments)   Tobramycin Other (See Comments)   Eye redness, cheek redness, breast mucus leakage Other reaction(s): Flushing (ALLERGY/intolerance)   Voltaren [diclofenac] Swelling, Other  (See Comments)   Asthma   Zofran  [ondansetron ]    Constipation        Medication List    albuterol  108 (90 Base) MCG/ACT inhaler Commonly known as: VENTOLIN  HFA Inhale 2 puffs every 4-6 hours as needed for cough, wheeze, tightness in chest, or shortness of breath   ALIGN PO Take 1 tablet by mouth daily.   Asmanex  (60 Metered Doses) 220 MCG/INH inhaler Generic drug: mometasone  Inhale 2 puffs into the lungs at bedtime.   CALCIUM 500 PO Take by mouth 2 (two) times daily.   ciclopirox 8 % solution Commonly known as: PENLAC Apply topically.   clindamycin 1 % external solution Commonly known as: CLEOCIN T Apply topically 2 (two) times daily.   cloNIDine 0.2 mg/24hr patch Commonly known as: CATAPRES - Dosed in mg/24 hr APPLY 1 PATCH TO SKIN ONCE PER WEEK TRANSDERMAL 90 DAYS   colchicine 0.6 MG tablet Take 0.6 mg by mouth daily.   D-Mannose 500 MG Caps Take by mouth every other day.   dexlansoprazole 60 MG capsule Commonly known as: DEXILANT Take 60 mg by mouth every other day.   diazepam 5 MG tablet Commonly known as: VALIUM Take 2.5-5 mg by mouth 2 (two) times daily as needed for anxiety (sleep).   glimepiride 1 MG tablet Commonly known as: AMARYL Take 1 mg by mouth every morning.   JUICE PLUS FIBRE PO Take by mouth daily.   montelukast  10 MG tablet Commonly known as: SINGULAIR  Take 10 mg by mouth at bedtime.   Nasacort  Allergy 24HR 55 MCG/ACT Aero nasal inhaler Generic drug: triamcinolone  Place 1 spray into the nose 2 (two) times daily.   nystatin  100000 UNIT/ML suspension Commonly known as: MYCOSTATIN  5 mL after Asmanex  - rinse and spit   prochlorperazine 5 MG tablet Commonly known as: COMPAZINE Take 5 mg by mouth every 8 (eight) hours as needed for nausea or vomiting.   traMADol 50 MG tablet Commonly known as: ULTRAM Take 50 mg by mouth every 6 (six) hours as needed.   valsartan 320 MG tablet Commonly known as: DIOVAN Take 320 mg by mouth  daily.   Vitamin D (Cholecalciferol) 25 MCG (1000 UT) Tabs Take by mouth daily.   VOLTAREN EX Apply topically as needed.   Zofran  4 MG tablet Generic drug: ondansetron  Take 4 mg by mouth every 8 (eight) hours as needed for nausea or vomiting.    Past Medical History:  Diagnosis Date   Allergic rhinitis    Anxiety    on meds   Arthritis    generalized   Asthma    uses inhaler/inhaler PRN   Breast cancer (HCC) 2010   LEFT   Cataract 2021   bilateral sx   Chronic kidney disease    hx of kidney stones   Dry eye    Family history of colon cancer    Family history of colonic polyps  Fatty liver    Gastric polyp    GERD (gastroesophageal reflux disease)    on meds   Gout    History of breast cancer    History of colon polyps    History of kidney stones    History of shingles    Hypertension    on meds   IBS (irritable bowel syndrome)    with constipation and diarrhea   Internal hemorrhage    Migraine    Pneumonia    PUD (peptic ulcer disease)    Rosacea    Seasonal allergies    UTI (urinary tract infection)     Past Surgical History:  Procedure Laterality Date   ABDOMINAL HYSTERECTOMY     BREAST LUMPECTOMY Left 01/30/2009   And radiotherapy   CATARACT EXTRACTION     CHOLECYSTECTOMY     COLONOSCOPY  2012   Gupta-Diverticulosis of colon. Internal hemorrhoids.   ESOPHAGOGASTRODUODENOSCOPY  05/10/2013   Small hiatal hernia. Incidental gastric polyps (status post polypectomy x3). Mild gastritis   KNEE SURGERY Right    s/p arthroscopic surgery   LAPAROSCOPIC HYSTERECTOMY  12/04/2009   Complete with bladder tack   NOSE SURGERY  1976   WISDOM TOOTH EXTRACTION      Review of systems negative except as noted in HPI / PMHx or noted below:  Review of Systems  Constitutional: Negative.   HENT: Negative.    Eyes: Negative.   Respiratory: Negative.    Cardiovascular: Negative.   Gastrointestinal: Negative.   Genitourinary: Negative.   Musculoskeletal:  Negative.   Skin: Negative.   Neurological: Negative.   Endo/Heme/Allergies: Negative.   Psychiatric/Behavioral: Negative.       Objective:   Vitals:   10/08/23 1528  BP: 138/76  Pulse: (!) 108  Resp: 18  SpO2: 97%          Physical Exam Constitutional:      Appearance: She is not diaphoretic.  HENT:     Head: Normocephalic.     Right Ear: Tympanic membrane, ear canal and external ear normal.     Left Ear: Tympanic membrane, ear canal and external ear normal.     Nose: Nose normal. No mucosal edema or rhinorrhea.     Mouth/Throat:     Pharynx: Uvula midline. No oropharyngeal exudate.  Eyes:     Conjunctiva/sclera: Conjunctivae normal.  Neck:     Thyroid : No thyromegaly.     Trachea: Trachea normal. No tracheal tenderness or tracheal deviation.  Cardiovascular:     Rate and Rhythm: Normal rate and regular rhythm.     Heart sounds: Normal heart sounds, S1 normal and S2 normal. No murmur heard. Pulmonary:     Effort: No respiratory distress.     Breath sounds: Normal breath sounds. No stridor. No wheezing or rales.  Lymphadenopathy:     Head:     Right side of head: No tonsillar adenopathy.     Left side of head: No tonsillar adenopathy.     Cervical: No cervical adenopathy.  Skin:    Findings: No erythema or rash.     Nails: There is no clubbing.  Neurological:     Mental Status: She is alert.     Diagnostics: Spirometry was performed and demonstrated an FEV1 of 0.77 at 40 % of predicted.  Assessment and Plan:   1. Asthma with COPD with exacerbation (HCC)   2. Other allergic rhinitis   3. LPRD (laryngopharyngeal reflux disease)    1. Continue to Treat inflammation:  A. Asmanex  220 - 1-2 inhalations 1-2 times per day (MAX=4/day)   B. Nasacort  - 1 spray each nostril 3-7 times per week   2. Continue to Treat reflux:   A. Generic Dexilant 60 mg 1 tablet 1 time per day in AM B. Famotidine  40 mg - 1 tablet 1 time per day in PM C. Eliminate any caffeine  or chocolate consumption   D. Appointment with Dr. Charlanne for reflux and diarrhea E. Evaluation for food allergy???  3.  If needed:   A.  Nystatin  oral solution-5 mL after Asmanex  - rinse and spit  B.  Albuterol  HFA  C.  Antihistamine  D.  Nasal saline  4. Return to clinic in 4 weeks or earlier if problem.    Autumn Carter is actually doing pretty well regarding her airway and she is stable with her COPD/asthma and allergic rhinitis on her current plan of using anti-inflammatory agents for both organs.  But some things going on with her GI tract that is not entirely clear.  For the last month she has been having nausea and some gagging and stools multiple times a day without any diarrhea and she definitely needs evaluation for this issue but I am not sure that the allergy clinic is the correct place to have this evaluated and we are going to send her back to see Dr. Charlanne at the same time that we do increase her therapy for reflux by having her use Dexilant every day instead of a few times a week and adding in famotidine  in the evening.  She is interested in undergoing further explanation for possible food allergy for this issue but there are other things that need to be done before she undergoes this evaluation.  We can always reapproach this issue in the future.  I will see her back in this clinic in 4 weeks.  Camellia Denis, MD Allergy / Immunology [facility]

## 2023-10-09 ENCOUNTER — Encounter: Payer: Self-pay | Admitting: Allergy and Immunology

## 2023-10-31 DIAGNOSIS — M9901 Segmental and somatic dysfunction of cervical region: Secondary | ICD-10-CM | POA: Diagnosis not present

## 2023-10-31 DIAGNOSIS — M546 Pain in thoracic spine: Secondary | ICD-10-CM | POA: Diagnosis not present

## 2023-10-31 DIAGNOSIS — M542 Cervicalgia: Secondary | ICD-10-CM | POA: Diagnosis not present

## 2023-10-31 DIAGNOSIS — M9904 Segmental and somatic dysfunction of sacral region: Secondary | ICD-10-CM | POA: Diagnosis not present

## 2023-10-31 DIAGNOSIS — M9902 Segmental and somatic dysfunction of thoracic region: Secondary | ICD-10-CM | POA: Diagnosis not present

## 2023-10-31 DIAGNOSIS — M5388 Other specified dorsopathies, sacral and sacrococcygeal region: Secondary | ICD-10-CM | POA: Diagnosis not present

## 2023-11-05 ENCOUNTER — Ambulatory Visit: Admitting: Allergy and Immunology

## 2023-11-05 VITALS — BP 142/90 | HR 90 | Resp 18

## 2023-11-05 DIAGNOSIS — K219 Gastro-esophageal reflux disease without esophagitis: Secondary | ICD-10-CM | POA: Diagnosis not present

## 2023-11-05 DIAGNOSIS — J4489 Other specified chronic obstructive pulmonary disease: Secondary | ICD-10-CM | POA: Diagnosis not present

## 2023-11-05 DIAGNOSIS — J3089 Other allergic rhinitis: Secondary | ICD-10-CM

## 2023-11-05 DIAGNOSIS — M109 Gout, unspecified: Secondary | ICD-10-CM

## 2023-11-05 MED ORDER — METHYLPREDNISOLONE ACETATE 80 MG/ML IJ SUSP
80.0000 mg | Freq: Once | INTRAMUSCULAR | Status: AC
  Administered 2023-11-05: 80 mg via INTRAMUSCULAR

## 2023-11-05 NOTE — Progress Notes (Unsigned)
 [place] [facility] [place] [facility] [place] - [place] - [place]   Follow-up Note  Referring Provider: Jefferey Fitch, MD Primary Provider: Jefferey Fitch, MD Date of Office Visit: 11/05/2023  Subjective:   Autumn Carter (DOB: 06-21-1955) is a 68 y.o. female who [facility] in re-evaluation of the following:  HPI: Autumn Carter returns to this clinic in evaluation of COPD/asthma overlap, LPR, allergic rhinitis.  I last saw her in this clinic 08 October 2023.  During her last visit she was having a significant problem with her GI tract with lots of nausea while continuing to use Dexilant.  We gave her famotidine  to use and asked her to eliminate any chocolate or caffeine consumption and she actually did very well.  She had to discontinue her famotidine  because she thinks that it triggered off her gout.  But even without famotidine  over the course of the past week she has not had returned of her nausea and overall feels as though her GI tract is doing well.  She has had very little problems with her upper airway or lower airway while using anti-inflammatory agents for each organ system.  She does not need to use any short acting bronchodilator.  She has developed gout.  Over the course of the past several days she has developed her usual red big toe with lots of pain.  She is not so much pain she cannot sleep.  She has taken some colchicine has been given to her in the past.  Allergies as of 11/05/2023       Reactions   Lansoprazole Swelling, Other (See Comments)   Stomach pain   Pantoprazole Other (See Comments)   Stomach pain, excess nasal mucus   Zestril [lisinopril] Anaphylaxis, Other (See Comments)   Heart attack symptoms   Ace Inhibitors Other (See Comments), Swelling   Allopurinol Nausea And Vomiting   Amlodipine Other (See Comments)   Abdominal pain.   Amlodipine Besylate Other (See Comments)   Amoxicillin Other (See Comments)   Arnuity  Ellipta [fluticasone  Furoate] Other (See Comments)   Eye issues   Aspirin Other (See Comments)   Beclomethasone Nausea Only, Other (See Comments)   Runny nose   Beta Adrenergic Blockers    Eye Infections.    Cephalexin    Cephalosporins Other (See Comments)   Unknown   Clindamycin/lincomycin    Clotrimazole     Regurgitation   Codeine Other (See Comments)   Codeine Sulfate    Covid-19 Ad26 Vaccine(janssen)    Doxazosin Other (See Comments)   pain   Flovent Hfa [fluticasone ]    Heartburn   Hydralazine Hcl    Hydrochlorothiazide    Kidney swelling   Levaquin [levofloxacin In D5w]    Macrobid [nitrofurantoin Monohyd Macro]    Metoprolol  Tartrate    Molnupiravir Other (See Comments)   Vomiting   Montelukast  Sodium Other (See Comments)   heartburn Other reaction(s): GI Upset (intolerance)   Morphine And Codeine Other (See Comments)   hallucinations     Moxifloxacin Hcl    Pain in arms and right wrist   Naltrexone Nausea Only, Other (See Comments)   Nasonex  [mometasone  Furoate]    Nifedipine Other (See Comments)   Headache   Nitrofurantoin Cough   Cough   Olmesartan    Olmesartan Medoxomil: trembling, stomach upset   Olmesartan Medoxomil-hctz Swelling, Other (See Comments)   Tongue and Lip swelling Other reaction(s): Other (See Comments) Unknown Tongue and Lip swelling, Unknown  Other Other (See Comments)   Prednisone    High dose = Increase in GERD.   Propoxyphene Other (See Comments)   Unknown Unknown, Unknown   Pulmicort [budesonide]    Body ached all over---took over the counter:  Equate nasal spray form of this medication per patient '   Ranitidine Itching, Swelling   Silvadene [silver Sulfadiazine]    Didn't feel well   Spironolactone Other (See Comments)   Worsened GERD   Sulfa Antibiotics    Sulfamethoxazole Other (See Comments)   Tobramycin Other (See Comments)   Eye redness, cheek redness, breast mucus leakage Other reaction(s): Flushing  (ALLERGY/intolerance)   Voltaren [diclofenac] Swelling, Other (See Comments)   Asthma   Zofran  [ondansetron ]    Constipation        Medication List    albuterol  108 (90 Base) MCG/ACT inhaler Commonly known as: VENTOLIN  HFA Inhale 2 puffs every 4-6 hours as needed for cough, wheeze, tightness in chest, or shortness of breath   ALIGN PO Take 1 tablet by mouth daily.   Asmanex  (60 Metered Doses) 220 MCG/INH inhaler Generic drug: mometasone  Inhale 2 puffs into the lungs at bedtime.   CALCIUM 500 PO Take by mouth 2 (two) times daily.   ciclopirox 8 % solution Commonly known as: PENLAC Apply topically.   clindamycin 1 % external solution Commonly known as: CLEOCIN T Apply topically 2 (two) times daily.   cloNIDine 0.2 mg/24hr patch Commonly known as: CATAPRES - Dosed in mg/24 hr APPLY 1 PATCH TO SKIN ONCE PER WEEK TRANSDERMAL 90 DAYS   colchicine 0.6 MG tablet Take 0.6 mg by mouth daily.   D-Mannose 500 MG Caps Take by mouth every other day.   dexlansoprazole 60 MG capsule Commonly known as: DEXILANT Take 60 mg by mouth every other day.   diazepam 5 MG tablet Commonly known as: VALIUM Take 2.5-5 mg by mouth 2 (two) times daily as needed for anxiety (sleep).   famotidine  40 MG tablet Commonly known as: PEPCID  Take one tablet once daily in the morning   glimepiride 1 MG tablet Commonly known as: AMARYL Take 1 mg by mouth every morning.   JUICE PLUS FIBRE PO Take by mouth daily.   montelukast  10 MG tablet Commonly known as: SINGULAIR  Take 10 mg by mouth at bedtime.   Nasacort  Allergy 24HR 55 MCG/ACT Aero nasal inhaler Generic drug: triamcinolone  Place 1 spray into the nose 2 (two) times daily.   nystatin  100000 UNIT/ML suspension Commonly known as: MYCOSTATIN  5 mL after Asmanex  - rinse and spit   prochlorperazine 5 MG tablet Commonly known as: COMPAZINE Take 5 mg by mouth every 8 (eight) hours as needed for nausea or vomiting.   traMADol 50 MG  tablet Commonly known as: ULTRAM Take 50 mg by mouth every 6 (six) hours as needed.   valsartan 320 MG tablet Commonly known as: DIOVAN Take 320 mg by mouth daily.   Vitamin D (Cholecalciferol) 25 MCG (1000 UT) Tabs Take by mouth daily.   VOLTAREN EX Apply topically as needed.   Zofran  4 MG tablet Generic drug: ondansetron  Take 4 mg by mouth every 8 (eight) hours as needed for nausea or vomiting.    Past Medical History:  Diagnosis Date   Allergic rhinitis    Anxiety    on meds   Arthritis    generalized   Asthma    uses inhaler/inhaler PRN   Breast cancer (HCC) 2010   LEFT   Cataract 2021   bilateral sx  Chronic kidney disease    hx of kidney stones   Dry eye    Family history of colon cancer    Family history of colonic polyps    Fatty liver    Gastric polyp    GERD (gastroesophageal reflux disease)    on meds   Gout    History of breast cancer    History of colon polyps    History of kidney stones    History of shingles    Hypertension    on meds   IBS (irritable bowel syndrome)    with constipation and diarrhea   Internal hemorrhage    Migraine    Pneumonia    PUD (peptic ulcer disease)    Rosacea    Seasonal allergies    UTI (urinary tract infection)     Past Surgical History:  Procedure Laterality Date   ABDOMINAL HYSTERECTOMY     BREAST LUMPECTOMY Left 01/30/2009   And radiotherapy   CATARACT EXTRACTION     CHOLECYSTECTOMY     COLONOSCOPY  2012   Gupta-Diverticulosis of colon. Internal hemorrhoids.   ESOPHAGOGASTRODUODENOSCOPY  05/10/2013   Small hiatal hernia. Incidental gastric polyps (status post polypectomy x3). Mild gastritis   KNEE SURGERY Right    s/p arthroscopic surgery   LAPAROSCOPIC HYSTERECTOMY  12/04/2009   Complete with bladder tack   NOSE SURGERY  1976   WISDOM TOOTH EXTRACTION      Review of systems negative except as noted in HPI / PMHx or noted below:  Review of Systems  Constitutional: Negative.   HENT:  Negative.    Eyes: Negative.   Respiratory: Negative.    Cardiovascular: Negative.   Gastrointestinal: Negative.   Genitourinary: Negative.   Musculoskeletal: Negative.   Skin: Negative.   Neurological: Negative.   Endo/Heme/Allergies: Negative.   Psychiatric/Behavioral: Negative.       Objective:   Vitals:   11/05/23 1045  Pulse: 90  Resp: 18  SpO2: 97%          Physical Exam Musculoskeletal:     Right lower leg: Edema (Induration, tenderness, erythema, right metatarsal phalangeal joint consistent with classic podagra) present.     Diagnostics: none  Assessment and Plan:   1. COPD with asthma (HCC)   2. Other allergic rhinitis   3. LPRD (laryngopharyngeal reflux disease)   4. Podagra    1. Continue to Treat inflammation:   A. Asmanex  220 - 1-2 inhalations 1-2 times per day (MAX=4/day)   B. Nasacort  - 1 spray each nostril 3-7 times per week   2. Continue to Treat reflux:   A. Generic Dexilant 60 mg 1 tablet 1 time per day in AM B. Eliminate any caffeine or chocolate consumption    3. Treat gout:   A. Depomedrol 80 Im delivered in clinic today  B. Monitor blood sugars  C. Follow up with Dr. Jefferey concerning gout issue  4.  If needed:   A.  Nystatin  oral solution-5 mL after Asmanex  - rinse and spit  B.  Albuterol  HFA  C.  Antihistamine  D.  Nasal saline  5. Return to clinic in 12 weeks or earlier if problem.    6. Influenza = Tamiflu. Covid = Paxlovid  Autumn Carter is really doing much better regarding her airway issue and her reflux issue on her current plan and it does appear as though a month of famotidine  gave rise to significant improvement regarding her chronic nausea and hopefully she is going to do well now  that she discontinued this agent.  She does not want to start this agent for she thinks that it triggered off her gout.  She definitely has gout with classic podagra and she is in a fair amount of pain and discomfort and I given her systemic  steroid I have asked her to follow-up with her primary care doctor regarding any further evaluation and management of this issue.   Autumn Denis, MD Allergy / Immunology [facility]

## 2023-11-05 NOTE — Patient Instructions (Addendum)
  1. Continue to Treat inflammation:   A. Asmanex  220 - 1-2 inhalations 1-2 times per day (MAX=4/day)   B. Nasacort  - 1 spray each nostril 3-7 times per week   2. Continue to Treat reflux:   A. Generic Dexilant 60 mg 1 tablet 1 time per day in AM B. Eliminate any caffeine or chocolate consumption    3. Treat gout:   A. Depomedrol 80 Im delivered in clinic today  B. Monitor blood sugars  C. Follow up with Dr. Jefferey concerning gout issue  4.  If needed:   A.  Nystatin  oral solution-5 mL after Asmanex  - rinse and spit  B.  Albuterol  HFA  C.  Antihistamine  D.  Nasal saline  5. Return to clinic in 12 weeks or earlier if problem.    6. Influenza = Tamiflu. Covid = Paxlovid

## 2023-11-06 ENCOUNTER — Encounter: Payer: Self-pay | Admitting: Allergy and Immunology

## 2023-12-04 ENCOUNTER — Ambulatory Visit: Admitting: Allergy and Immunology

## 2023-12-05 DIAGNOSIS — M9902 Segmental and somatic dysfunction of thoracic region: Secondary | ICD-10-CM | POA: Diagnosis not present

## 2023-12-05 DIAGNOSIS — M9903 Segmental and somatic dysfunction of lumbar region: Secondary | ICD-10-CM | POA: Diagnosis not present

## 2023-12-05 DIAGNOSIS — M542 Cervicalgia: Secondary | ICD-10-CM | POA: Diagnosis not present

## 2023-12-05 DIAGNOSIS — M546 Pain in thoracic spine: Secondary | ICD-10-CM | POA: Diagnosis not present

## 2023-12-05 DIAGNOSIS — M5459 Other low back pain: Secondary | ICD-10-CM | POA: Diagnosis not present

## 2023-12-05 DIAGNOSIS — M9901 Segmental and somatic dysfunction of cervical region: Secondary | ICD-10-CM | POA: Diagnosis not present

## 2023-12-15 DIAGNOSIS — E1169 Type 2 diabetes mellitus with other specified complication: Secondary | ICD-10-CM | POA: Diagnosis not present

## 2023-12-15 DIAGNOSIS — Z79899 Other long term (current) drug therapy: Secondary | ICD-10-CM | POA: Diagnosis not present

## 2023-12-15 DIAGNOSIS — E785 Hyperlipidemia, unspecified: Secondary | ICD-10-CM | POA: Diagnosis not present

## 2023-12-15 DIAGNOSIS — Z6835 Body mass index (BMI) 35.0-35.9, adult: Secondary | ICD-10-CM | POA: Diagnosis not present

## 2023-12-15 DIAGNOSIS — M109 Gout, unspecified: Secondary | ICD-10-CM | POA: Diagnosis not present

## 2023-12-15 DIAGNOSIS — I10 Essential (primary) hypertension: Secondary | ICD-10-CM | POA: Diagnosis not present

## 2023-12-15 DIAGNOSIS — G720 Drug-induced myopathy: Secondary | ICD-10-CM | POA: Diagnosis not present

## 2023-12-15 DIAGNOSIS — H9311 Tinnitus, right ear: Secondary | ICD-10-CM | POA: Diagnosis not present

## 2023-12-15 DIAGNOSIS — K5900 Constipation, unspecified: Secondary | ICD-10-CM | POA: Diagnosis not present

## 2023-12-17 ENCOUNTER — Ambulatory Visit: Payer: Self-pay | Admitting: Gastroenterology

## 2023-12-17 ENCOUNTER — Encounter: Payer: Self-pay | Admitting: Gastroenterology

## 2023-12-17 ENCOUNTER — Ambulatory Visit: Admitting: Gastroenterology

## 2023-12-17 ENCOUNTER — Other Ambulatory Visit (INDEPENDENT_AMBULATORY_CARE_PROVIDER_SITE_OTHER)

## 2023-12-17 VITALS — BP 138/80 | HR 87 | Ht 62.0 in | Wt 187.0 lb

## 2023-12-17 DIAGNOSIS — K219 Gastro-esophageal reflux disease without esophagitis: Secondary | ICD-10-CM | POA: Diagnosis not present

## 2023-12-17 DIAGNOSIS — K760 Fatty (change of) liver, not elsewhere classified: Secondary | ICD-10-CM

## 2023-12-17 DIAGNOSIS — K582 Mixed irritable bowel syndrome: Secondary | ICD-10-CM

## 2023-12-17 DIAGNOSIS — R1032 Left lower quadrant pain: Secondary | ICD-10-CM

## 2023-12-17 LAB — BASIC METABOLIC PANEL WITH GFR
BUN: 14 mg/dL (ref 6–23)
CO2: 29 meq/L (ref 19–32)
Calcium: 9.3 mg/dL (ref 8.4–10.5)
Chloride: 103 meq/L (ref 96–112)
Creatinine, Ser: 0.7 mg/dL (ref 0.40–1.20)
GFR: 87.72 mL/min (ref >60.00)
Glucose, Bld: 137 mg/dL — ABNORMAL HIGH (ref 70–99)
Potassium: 4 meq/L (ref 3.5–5.1)
Sodium: 139 meq/L (ref 135–145)

## 2023-12-17 NOTE — Patient Instructions (Signed)
 Your provider has requested that you go to the basement level for lab work before leaving today. Press B on the elevator. The lab is located at the first door on the left as you exit the elevator.  You have been scheduled for a CT scan of the abdomen and pelvis at [facility], [address], [place], [place] [address].  You are scheduled on 12/19/2023 at 12:00pm. You should arrive 15 minutes prior to your appointment time for registration.  You may take any medications as prescribed with a small amount of water, if necessary. If you take any of the following medications: METFORMIN, GLUCOPHAGE, GLUCOVANCE, AVANDAMET, RIOMET, FORTAMET, ACTOPLUS MET, JANUMET, GLUMETZA or METAGLIP, you MAY be asked to HOLD this medication 48 hours AFTER the exam.    If you have any questions regarding your exam or if you need to reschedule, you may call [facility] at [phone] between the hours of 8:00 am and 5:00 pm, Monday-Friday.   Continue Dexilant.  Please follow up in 6 months.  _______________________________________________________  If your blood pressure at your visit was 140/90 or greater, please contact your primary care physician to follow up on this.  _______________________________________________________  If you are age 70 or older, your body mass index should be between 23-30. Your Body mass index is 34.2 kg/m. If this is out of the aforementioned range listed, please consider follow up with your Primary Care Provider.  If you are age 70 or younger, your body mass index should be between 19-25. Your Body mass index is 34.2 kg/m. If this is out of the aformentioned range listed, please consider follow up with your Primary Care Provider.   ________________________________________________________  [facility] providers would like to encourage you to use MYCHART to communicate with providers for non-urgent requests or questions.  Due to long hold times on the telephone,  sending your provider a message by [facility] may be a faster and more efficient way to get a response.  Please allow 48 business hours for a response.  Please remember that this is for non-urgent requests.  _______________________________________________________  [facility] is using a team-based approach to care.  Your team is made up of your doctor and two to three APPS. Our APPS (Nurse Practitioners and Physician Assistants) work with your physician to ensure care continuity for you. They are fully qualified to address your health concerns and develop a treatment plan. They communicate directly with your gastroenterologist to care for you. Seeing the Advanced Practice Practitioners on your physician's team can help you by facilitating care more promptly, often allowing for earlier appointments, access to diagnostic testing, procedures, and other specialty referrals.

## 2023-12-17 NOTE — Progress Notes (Addendum)
 Chief Complaint: Abdominal pain.  Referring Provider:  Jefferey Fitch, MD      ASSESSMENT AND PLAN;   #1. LLQ pain. Likely musculoskeletal vs splenic flexure syndrome vs both. R/O other causes. Neg colon 03/2021  #2. Fatty liver with Nl LFTs on CT 07/2019  #3. GERD with small HH. EGD 05/2013- much better now.  #4. IBS with alternating constipation and diarrhea. More constipation lately.   Plan:  -CT AP with contrast -Blood tests were done last Friday @ Dr Prochnau's office -Fiber gummies 1/day with 8 OZ water. -Continued gradual Wt loss.  -Heating pads/Biofreeze for musculoskeletal component of pain. -Continue dexilant 60mg  po every day for now. -Hold off on EGD since she is much better. -If any pelvic problems in future, agree with urogynecology referral. -FU in 6 months.   HPI:    History of Present Illness Autumn Carter is a 68 year old female with GERD and diabetes who presents with left upper abdominal pain and constipation.  She has been experiencing left upper abdominal pain under the rib cage for over a month. Initially constant and severe, the pain has decreased in intensity and now occurs occasionally. It is described as uncomfortable rather than severe and is often worse in the morning. The pain sometimes worsens with the use of a heating pad on her back. She has a history of radiation treatment in the same area as her abdominal pain. She also mentions a past staph infection in the area where she had cancer and reports never feeling well on that side of her body.  She experiences constipation, noting that now, she has a rare diarrhea and often strains at the beginning of bowel movements. Bowel movements are infrequent, sometimes occurring every two days, and she has to walk around to facilitate passing stool. A good bowel movement or passing gas alleviates the pain. She occasionally uses Gas-X to relieve gas, which she attributes to eating foods like Brussels  sprouts and sweet potatoes.  Her gastroesophageal reflux disease (GERD) is currently well-managed with Dexilant, which she takes once a day. She has previously taken famotidine , which helped but caused nausea and a feeling of wanting to vomit. She avoids raw green vegetables as they exacerbate her symptoms and has adjusted her diet accordingly.  She has been diagnosed with diabetes and is mindful of her diet, having consulted a dietitian. She has lost ten pounds over the past year, which she attributes to dietary changes. She avoids pain medications to prevent stomach issues and does not take Tylenol  or similar medications.  She mentions a recent flare-up of gout and has been advised to take colchicine, which she has not yet started. She is aware that colchicine can cause diarrhea, which she has not experienced before.   Wt Readings from Last 3 Encounters:  12/17/23 187 lb (84.8 kg)  05/28/23 194 lb 9.6 oz (88.3 kg)  12/04/22 196 lb 9.6 oz (89.2 kg)      Past GI procedures:  Colon  03/2021:  - Mild sigmoid diverticulosis. - Non- bleeding internal hemorrhoids. - The examined portion of the ileum was normal. - The examination was otherwise normal on direct and retroflexion views. -Bx- neg for microscopic colitis. -No need to rpt  -EGD 05/10/2013: Small HH, incidental hyperplastic/fundic gland gastric polyps, mild gastritis. Neg Bx for HP, neg SB Bx for celiac. -Colonoscopy 11/2015: Colonic diverticulosis, internal hemorrhoids.  Recommend to repeat in 5 years due to strong family history of colonic polyps (mom) -CT  Abdo/pelvis July 26, 2019: Fatty liver, no acute abnormalities. S/P cholecystectomy and hysterectomy, aortic atherosclerosis.  At [facility].  Report sent for scanning.  Past Medical History:  Diagnosis Date   Allergic rhinitis    Anxiety    on meds   Arthritis    generalized   Asthma    uses inhaler/inhaler PRN   Breast cancer (HCC) 2010   LEFT   Cataract 2021   bilateral sx    Chronic kidney disease    hx of kidney stones   Dry eye    Family history of colon cancer    Family history of colonic polyps    Fatty liver    Gastric polyp    GERD (gastroesophageal reflux disease)    on meds   Gout    History of breast cancer    History of colon polyps    History of kidney stones    History of shingles    Hypertension    on meds   IBS (irritable bowel syndrome)    with constipation and diarrhea   Internal hemorrhage    Migraine    Pneumonia    PUD (peptic ulcer disease)    Rosacea    Seasonal allergies    Skin cancer    left arm , ? type of skin cancer   UTI (urinary tract infection)     Past Surgical History:  Procedure Laterality Date   ABDOMINAL HYSTERECTOMY     Total   BREAST LUMPECTOMY Left 01/30/2009   And radiotherapy   CATARACT EXTRACTION     CHOLECYSTECTOMY     COLONOSCOPY  2012   Neyra Pettie-Diverticulosis of colon. Internal hemorrhoids.   ESOPHAGOGASTRODUODENOSCOPY  05/10/2013   Small hiatal hernia. Incidental gastric polyps (status post polypectomy x3). Mild gastritis   KNEE SURGERY Right    s/p arthroscopic surgery   LAPAROSCOPIC HYSTERECTOMY  12/04/2009   Complete with bladder tack   NOSE SURGERY  1976   WISDOM TOOTH EXTRACTION      Family History  Problem Relation Age of Onset   Hypertension Mother    Colon polyps Mother    Diverticulitis Mother    Colon polyps Father 25   Hypertension Father    Colon cancer Father 12   Colon polyps Sister    Hypertension Sister    Other Sister        ruptured colon- had surgery   Colon cancer Sister    Breast cancer Maternal Aunt    Breast cancer Paternal Aunt    Breast cancer Cousin    Esophageal cancer Neg Hx    Rectal cancer Neg Hx    Stomach cancer Neg Hx     Social History   Tobacco Use   Smoking status: Never   Smokeless tobacco: Never  Vaping Use   Vaping status: Never Used  Substance Use Topics   Alcohol  use: No   Drug use: No    Current Outpatient Medications   Medication Sig Dispense Refill   albuterol  (VENTOLIN  HFA) 108 (90 Base) MCG/ACT inhaler Inhale 2 puffs every 4-6 hours as needed for cough, wheeze, tightness in chest, or shortness of breath 8 g 1   ASMANEX  60 METERED DOSES 220 MCG/INH inhaler Inhale 2 puffs into the lungs at bedtime.  2   Calcium Carbonate (CALCIUM 500 PO) Take by mouth 2 (two) times daily.     ciclopirox (PENLAC) 8 % solution Apply topically.     clindamycin (CLEOCIN T) 1 % external solution Apply topically  2 (two) times daily.     cloNIDine (CATAPRES - DOSED IN MG/24 HR) 0.2 mg/24hr patch APPLY 1 PATCH TO SKIN ONCE PER WEEK TRANSDERMAL 90 DAYS  1   colchicine 0.6 MG tablet Take 0.6 mg by mouth daily.     D-Mannose 500 MG CAPS Take by mouth every other day.     dexlansoprazole (DEXILANT) 60 MG capsule Take 60 mg by mouth every other day.     diazepam (VALIUM) 5 MG tablet Take 2.5-5 mg by mouth 2 (two) times daily as needed for anxiety (sleep).     Diclofenac Sodium (VOLTAREN EX) Apply topically as needed.     famotidine  (PEPCID ) 40 MG tablet Take one tablet once daily in the morning 30 tablet 5   glimepiride (AMARYL) 1 MG tablet Take 1 mg by mouth every morning.     montelukast  (SINGULAIR ) 10 MG tablet Take 10 mg by mouth at bedtime.     Nutritional Supplements (JUICE PLUS FIBRE PO) Take by mouth daily.     nystatin  (MYCOSTATIN ) 100000 UNIT/ML suspension 5 mL after Asmanex  - rinse and spit 150 mL 5   ondansetron  (ZOFRAN ) 4 MG tablet Take 4 mg by mouth every 8 (eight) hours as needed for nausea or vomiting.     OVER THE COUNTER MEDICATION Nutrifil Magnical -D: one daily     Probiotic Product (ALIGN PO) Take 1 tablet by mouth daily.     prochlorperazine (COMPAZINE) 5 MG tablet Take 5 mg by mouth every 8 (eight) hours as needed for nausea or vomiting.     traMADol (ULTRAM) 50 MG tablet Take 50 mg by mouth every 6 (six) hours as needed.     triamcinolone  (NASACORT  ALLERGY 24HR) 55 MCG/ACT AERO nasal inhaler Place 1 spray into  the nose 2 (two) times daily.     valsartan (DIOVAN) 320 MG tablet Take 320 mg by mouth daily.     Vitamin D, Cholecalciferol, 1000 units TABS Take by mouth daily.     No current facility-administered medications for this visit.    Allergies  Allergen Reactions   Lansoprazole Swelling and Other (See Comments)    Stomach pain   Pantoprazole Other (See Comments)    Stomach pain, excess nasal mucus   Zestril [Lisinopril] Anaphylaxis and Other (See Comments)    Heart attack symptoms   Ace Inhibitors Other (See Comments) and Swelling   Allopurinol Nausea And Vomiting   Amlodipine Other (See Comments)    Abdominal pain.   Amlodipine Besylate Other (See Comments)   Amoxicillin Other (See Comments)   Arnuity Ellipta  [Fluticasone  Furoate] Other (See Comments)    Eye issues   Aspirin Other (See Comments)   Beclomethasone Nausea Only and Other (See Comments)    Runny nose   Beta Adrenergic Blockers     Eye Infections.    Cephalexin    Cephalosporins Other (See Comments)    Unknown   Clindamycin/Lincomycin    Clotrimazole      Regurgitation    Codeine Other (See Comments)   Codeine Sulfate    Covid-19 Ad26 Vaccine(Janssen)    Doxazosin Other (See Comments)    pain   Famotidine  Other (See Comments)    Caused a gout flare   Flovent Hfa [Fluticasone ]     Heartburn   Hydralazine Hcl    Hydrochlorothiazide     Kidney swelling   Levaquin [Levofloxacin In D5w]    Macrobid [Nitrofurantoin Monohyd Macro]    Metoprolol  Tartrate    Molnupiravir Other (See Comments)  Vomiting   Montelukast  Sodium Other (See Comments)    heartburn  Other reaction(s): GI Upset (intolerance)   Morphine And Codeine Other (See Comments)    hallucinations     Moxifloxacin Hcl     Pain in arms and right wrist   Naltrexone Nausea Only and Other (See Comments)   Nasonex  [Mometasone  Furoate]    Nifedipine Other (See Comments)    Headache    Nitrofurantoin Cough    Cough   Olmesartan     Olmesartan  Medoxomil: trembling, stomach upset   Olmesartan Medoxomil-Hctz Swelling and Other (See Comments)    Tongue and Lip swelling  Other reaction(s): Other (See Comments)  Unknown  Tongue and Lip swelling, Unknown   Other Other (See Comments)   Prednisone     High dose = Increase in GERD.   Propoxyphene Other (See Comments)    Unknown  Unknown, Unknown   Pulmicort [Budesonide]     Body ached all over---took over the counter:  Equate nasal spray form of this medication per patient '   Ranitidine Itching and Swelling   Silvadene [Silver Sulfadiazine]     Didn't feel well   Spironolactone Other (See Comments)    Worsened GERD   Sulfa Antibiotics    Sulfamethoxazole Other (See Comments)   Tobramycin Other (See Comments)    Eye redness, cheek redness, breast mucus leakage  Other reaction(s): Flushing (ALLERGY/intolerance)   Voltaren [Diclofenac] Swelling and Other (See Comments)    Asthma   Zofran  [Ondansetron ]     Constipation    Review of Systems:  Psychiatric/Behavioral: Has anxiety or depression, fibromyalgia     Physical Exam:    BP 138/80   Pulse 87   Ht 5' 2 (1.575 m)   Wt 187 lb (84.8 kg)   BMI 34.20 kg/m  Wt Readings from Last 3 Encounters:  12/17/23 187 lb (84.8 kg)  05/28/23 194 lb 9.6 oz (88.3 kg)  12/04/22 196 lb 9.6 oz (89.2 kg)   Constitutional:  Well-developed, in no acute distress. Psychiatric: Normal mood and affect. Behavior is normal. HEENT: Conjunctivae are normal. No scleral icterus. Cardiovascular: Normal rate, regular rhythm. No edema Pulmonary/chest: Effort normal and breath sounds normal. No wheezing, rales or rhonchi. Abdominal: Soft, nondistended. LUQ tenderness and rib tenderness.  Bowel sounds active throughout. There are no masses palpable. No hepatomegaly. Small periumb hernia Rectal:  defered Neurological: Alert and oriented to person place and time. Skin: Skin is warm and dry. No rashes noted.   Anselm Bring, MD 12/17/2023, 10:00  AM  Cc: Jefferey Fitch, MD

## 2023-12-18 ENCOUNTER — Telehealth: Payer: Self-pay

## 2023-12-18 NOTE — Telephone Encounter (Signed)
 Patient made aware and voiced understanding.

## 2023-12-18 NOTE — Telephone Encounter (Signed)
 Patient said she was told about taking something for her stools. The note says fiber gummies and she said she doesnt think it was that and asked about another medication. Please advise

## 2023-12-18 NOTE — Telephone Encounter (Signed)
 She can take fiber Gummies or Benefiber 1TBS po every day with 8oz water RG

## 2023-12-19 ENCOUNTER — Ambulatory Visit (HOSPITAL_BASED_OUTPATIENT_CLINIC_OR_DEPARTMENT_OTHER)
Admission: RE | Admit: 2023-12-19 | Discharge: 2023-12-19 | Disposition: A | Discharge status: Home / Self Care | Source: Ambulatory Visit | Attending: Gastroenterology | Admitting: Gastroenterology

## 2023-12-19 DIAGNOSIS — R1032 Left lower quadrant pain: Secondary | ICD-10-CM

## 2023-12-25 DIAGNOSIS — N952 Postmenopausal atrophic vaginitis: Secondary | ICD-10-CM | POA: Diagnosis not present

## 2023-12-25 DIAGNOSIS — R310 Gross hematuria: Secondary | ICD-10-CM | POA: Diagnosis not present

## 2023-12-25 DIAGNOSIS — N390 Urinary tract infection, site not specified: Secondary | ICD-10-CM | POA: Diagnosis not present

## 2023-12-25 NOTE — Telephone Encounter (Signed)
 Inbound call from patient states she has been having constant gas. Requesting to speak with a nurse. Please advise.

## 2023-12-25 NOTE — Telephone Encounter (Signed)
 Returned call to patient & she has noticed an increase in gas since starting benefiber. She wanted to know if gas X would be okay to take, advised her that would be fine & if needed can do benefiber every other day if needed. Also, suggested Ibgard as another OTC option. Pt to have CT on 11/26. She will keep that appointment & call us  back with any further questions/concerns.

## 2023-12-31 ENCOUNTER — Ambulatory Visit (INDEPENDENT_AMBULATORY_CARE_PROVIDER_SITE_OTHER)
Admission: RE | Admit: 2023-12-31 | Discharge: 2023-12-31 | Disposition: A | Discharge status: Home / Self Care | Source: Ambulatory Visit | Attending: Gastroenterology | Admitting: Gastroenterology

## 2023-12-31 DIAGNOSIS — K5730 Diverticulosis of large intestine without perforation or abscess without bleeding: Secondary | ICD-10-CM | POA: Diagnosis not present

## 2023-12-31 DIAGNOSIS — K760 Fatty (change of) liver, not elsewhere classified: Secondary | ICD-10-CM | POA: Diagnosis not present

## 2023-12-31 DIAGNOSIS — R1032 Left lower quadrant pain: Secondary | ICD-10-CM | POA: Diagnosis not present

## 2023-12-31 MED ORDER — IOHEXOL 300 MG/ML  SOLN
100.0000 mL | Freq: Once | INTRAMUSCULAR | Status: AC | PRN
  Administered 2023-12-31: 100 mL via INTRAVENOUS

## 2024-02-09 ENCOUNTER — Encounter: Payer: Self-pay | Admitting: Allergy and Immunology

## 2024-02-09 ENCOUNTER — Ambulatory Visit: Admitting: Allergy and Immunology

## 2024-02-09 VITALS — BP 118/78 | HR 94 | Temp 98.2°F | Resp 16

## 2024-02-09 DIAGNOSIS — J4489 Other specified chronic obstructive pulmonary disease: Secondary | ICD-10-CM

## 2024-02-09 DIAGNOSIS — K219 Gastro-esophageal reflux disease without esophagitis: Secondary | ICD-10-CM | POA: Diagnosis not present

## 2024-02-09 DIAGNOSIS — J3089 Other allergic rhinitis: Secondary | ICD-10-CM

## 2024-02-09 NOTE — Patient Instructions (Addendum)
" °  1. Continue to Treat inflammation:   A. Asmanex  220 - 1-2 inhalations 1-2 times per day (MAX=4/day)   B. Nasacort  - 1 spray each nostril 3-7 times per week   2. Continue to Treat reflux:   A. Generic Dexilant 60 mg 1 tablet 1 time per day in AM B. Eliminate any caffeine or chocolate consumption    3. If needed:   A.  Nystatin  oral solution-5 mL after Asmanex  - rinse and spit  B.  Albuterol  HFA  C.  Antihistamine  D.  Nasal saline  4. Return to clinic in 12 weeks or earlier if problem.    5. Influenza = Tamiflu. Covid = Paxlovid /molnupiravir         "

## 2024-02-09 NOTE — Progress Notes (Signed)
 "  [place] [facility] [place] [facility] [place] - [place] [facility] [place]   Follow-up Note  Referring Provider: Jefferey Fitch, MD Primary Provider: Jefferey Fitch, MD Date of Office Visit: 02/09/2024  Subjective:   Autumn Carter (DOB: [birth year]) is a 69 y.o. female who [facility] in re-evaluation of the following:  HPI: Hadley returns to this clinic in evaluation of COPD/asthma overlap, LPR, allergic rhinitis.  I last saw her in this clinic 05 November 2023.  When I last saw her in this clinic she had podagra and I treated her with a Depo-Medrol  and she resolved that issue.  Apparently she is intolerant of using allopurinol.  She has had 3 attacks of gout per year.  She uses colchicine occasionally at the onset of an attack but she is not very good about starting colchicine early in the attack.  She follows up with her primary care doctor regarding this issue.  Overall her breathing has done very well.  Occasionally when she visits with her great granddaughter and has exposure to candles and animals located inside the household she will get some wheezing and she will use a short acting bronchodilator but this is relatively rare.  Otherwise, she rarely uses a short acting bronchodilator while she continues on Asmanex  just 1 inhalation 1 time per day and she has not required a systemic steroid to treat an exacerbation of asthma.  And her upper airway is doing quite well while occasionally using a nasal steroid.  She has not required a antibiotic to treat a episode of sinusitis.  Her reflux is doing well while using Dexilant.  Apparently she had a flareup of diverticulitis and saw her gastroenterologist about this issue and had a CT scan of her abdomen.  She has not been having any problems with thrush while using nystatin  after Asmanex .  She does not receive the flu vaccine.  Allergies as of 02/09/2024       Reactions   Lansoprazole Swelling, Other (See  Comments)   Stomach pain   Pantoprazole Other (See Comments)   Stomach pain, excess nasal mucus   Zestril [lisinopril] Anaphylaxis, Other (See Comments)   Heart attack symptoms   Ace Inhibitors Other (See Comments), Swelling   Allopurinol Nausea And Vomiting   Amlodipine Other (See Comments)   Abdominal pain.   Amlodipine Besylate Other (See Comments)   Amoxicillin Other (See Comments)   Arnuity Ellipta  [fluticasone  Furoate] Other (See Comments)   Eye issues   Aspirin Other (See Comments)   Beclomethasone Nausea Only, Other (See Comments)   Runny nose   Beta Adrenergic Blockers    Eye Infections.    Cephalexin    Cephalosporins Other (See Comments)   Unknown   Clindamycin/lincomycin    Clotrimazole     Regurgitation   Codeine Other (See Comments)   Codeine Sulfate    Covid-19 Ad26 Vaccine(janssen)    Doxazosin Other (See Comments)   pain   Famotidine  Other (See Comments)   Caused a gout flare   Flovent Hfa [fluticasone ]    Heartburn   Hydralazine Hcl    Hydrochlorothiazide    Kidney swelling   Levaquin [levofloxacin In D5w]    Macrobid [nitrofurantoin Monohyd Macro]    Metoprolol  Tartrate    Molnupiravir Other (See Comments)   Vomiting   Montelukast  Sodium Other (See Comments)   heartburn Other reaction(s): GI Upset (intolerance)   Morphine And Codeine Other (See Comments)   hallucinations  Moxifloxacin Hcl    Pain in arms and right wrist   Naltrexone Nausea Only, Other (See Comments)   Nasonex  [mometasone  Furoate]    Nifedipine Other (See Comments)   Headache   Nitrofurantoin Cough   Cough   Olmesartan    Olmesartan Medoxomil: trembling, stomach upset   Olmesartan Medoxomil-hctz Swelling, Other (See Comments)   Tongue and Lip swelling Other reaction(s): Other (See Comments) Unknown Tongue and Lip swelling, Unknown   Other Other (See Comments)   Prednisone    High dose = Increase in GERD.   Propoxyphene Other (See Comments)   Unknown Unknown,  Unknown   Pulmicort [budesonide]    Body ached all over---took over the counter:  Equate nasal spray form of this medication per patient '   Ranitidine Itching, Swelling   Silvadene [silver Sulfadiazine]    Didn't feel well   Spironolactone Other (See Comments)   Worsened GERD   Sulfa Antibiotics    Sulfamethoxazole Other (See Comments)   Tobramycin Other (See Comments)   Eye redness, cheek redness, breast mucus leakage Other reaction(s): Flushing (ALLERGY/intolerance)   Voltaren [diclofenac] Swelling, Other (See Comments)   Asthma   Zofran  [ondansetron ]    Constipation        Medication List    albuterol  108 (90 Base) MCG/ACT inhaler Commonly known as: VENTOLIN  HFA Inhale 2 puffs every 4-6 hours as needed for cough, wheeze, tightness in chest, or shortness of breath   ALIGN PO Take 1 tablet by mouth daily.   Asmanex  (60 Metered Doses) 220 MCG/INH inhaler Generic drug: mometasone  Inhale 2 puffs into the lungs at bedtime.   CALCIUM 500 PO Take by mouth 2 (two) times daily.   ciclopirox 8 % solution Commonly known as: PENLAC Apply topically.   clindamycin 1 % external solution Commonly known as: CLEOCIN T Apply topically 2 (two) times daily.   clobetasol 0.05 % external solution Commonly known as: TEMOVATE Apply topically 2 (two) times daily.   cloNIDine 0.2 mg/24hr patch Commonly known as: CATAPRES - Dosed in mg/24 hr APPLY 1 PATCH TO SKIN ONCE PER WEEK TRANSDERMAL 90 DAYS   colchicine 0.6 MG tablet Take 0.6 mg by mouth daily.   D-Mannose 500 MG Caps Take by mouth every other day.   dexlansoprazole 60 MG capsule Commonly known as: DEXILANT Take 60 mg by mouth every other day.   diazepam 5 MG tablet Commonly known as: VALIUM Take 2.5-5 mg by mouth 2 (two) times daily as needed for anxiety (sleep).   famotidine  40 MG tablet Commonly known as: PEPCID  Take one tablet once daily in the morning   glimepiride 1 MG tablet Commonly known as:  AMARYL Take 1 mg by mouth every morning.   JUICE PLUS FIBRE PO Take by mouth daily.   montelukast  10 MG tablet Commonly known as: SINGULAIR  Take 10 mg by mouth at bedtime.   Nasacort  Allergy 24HR 55 MCG/ACT Aero nasal inhaler Generic drug: triamcinolone  Place 1 spray into the nose 2 (two) times daily.   nystatin  100000 UNIT/ML suspension Commonly known as: MYCOSTATIN  5 mL after Asmanex  - rinse and spit   OVER THE COUNTER MEDICATION Nutrifil Magnical -D: one daily   prochlorperazine 5 MG tablet Commonly known as: COMPAZINE Take 5 mg by mouth every 8 (eight) hours as needed for nausea or vomiting.   traMADol 50 MG tablet Commonly known as: ULTRAM Take 50 mg by mouth every 6 (six) hours as needed.   valsartan 320 MG tablet Commonly known as: DIOVAN  Take 320 mg by mouth daily.   Vitamin D (Cholecalciferol) 25 MCG (1000 UT) Tabs Take by mouth daily.   VOLTAREN EX Apply topically as needed.   Zofran  4 MG tablet Generic drug: ondansetron  Take 4 mg by mouth every 8 (eight) hours as needed for nausea or vomiting.    Past Medical History:  Diagnosis Date   Allergic rhinitis    Anxiety    on meds   Arthritis    generalized   Asthma    uses inhaler/inhaler PRN   Breast cancer (HCC) 2010   LEFT   Cataract 2021   bilateral sx   Chronic kidney disease    hx of kidney stones   Dry eye    Family history of colon cancer    Family history of colonic polyps    Fatty liver    Gastric polyp    GERD (gastroesophageal reflux disease)    on meds   Gout    History of breast cancer    History of colon polyps    History of kidney stones    History of shingles    Hypertension    on meds   IBS (irritable bowel syndrome)    with constipation and diarrhea   Internal hemorrhage    Migraine    Pneumonia    PUD (peptic ulcer disease)    Rosacea    Seasonal allergies    Skin cancer    left arm , ? type of skin cancer   UTI (urinary tract infection)     Past Surgical  History:  Procedure Laterality Date   ABDOMINAL HYSTERECTOMY     Total   BREAST LUMPECTOMY Left 01/30/2009   And radiotherapy   CATARACT EXTRACTION     CHOLECYSTECTOMY     COLONOSCOPY  2012   Gupta-Diverticulosis of colon. Internal hemorrhoids.   ESOPHAGOGASTRODUODENOSCOPY  05/10/2013   Small hiatal hernia. Incidental gastric polyps (status post polypectomy x3). Mild gastritis   KNEE SURGERY Right    s/p arthroscopic surgery   LAPAROSCOPIC HYSTERECTOMY  12/04/2009   Complete with bladder tack   NOSE SURGERY  1976   WISDOM TOOTH EXTRACTION      Review of systems negative except as noted in HPI / PMHx or noted below:  Review of Systems  Constitutional: Negative.   HENT: Negative.    Eyes: Negative.   Respiratory: Negative.    Cardiovascular: Negative.   Gastrointestinal: Negative.   Genitourinary: Negative.   Musculoskeletal: Negative.   Skin: Negative.   Neurological: Negative.   Endo/Heme/Allergies: Negative.   Psychiatric/Behavioral: Negative.       Objective:   Vitals:   02/09/24 1135  BP: 118/78  Pulse: 94  Resp: 16  Temp: 98.2 F (36.8 C)  SpO2: 98%          Physical Exam Constitutional:      Appearance: She is not diaphoretic.  HENT:     Head: Normocephalic.     Right Ear: Tympanic membrane, ear canal and external ear normal.     Left Ear: Tympanic membrane, ear canal and external ear normal.     Nose: Nose normal. No mucosal edema or rhinorrhea.     Mouth/Throat:     Pharynx: Uvula midline. No oropharyngeal exudate.  Eyes:     Conjunctiva/sclera: Conjunctivae normal.  Neck:     Thyroid : No thyromegaly.     Trachea: Trachea normal. No tracheal tenderness or tracheal deviation.  Cardiovascular:     Rate and Rhythm: Normal  rate and regular rhythm.     Heart sounds: Normal heart sounds, S1 normal and S2 normal. No murmur heard. Pulmonary:     Effort: No respiratory distress.     Breath sounds: Normal breath sounds. No stridor. No wheezing or  rales.  Lymphadenopathy:     Head:     Right side of head: No tonsillar adenopathy.     Left side of head: No tonsillar adenopathy.     Cervical: No cervical adenopathy.  Skin:    Findings: No erythema or rash.     Nails: There is no clubbing.  Neurological:     Mental Status: She is alert.     Diagnostics: Spirometry was performed and demonstrated an FEV1 of 1.06 at 55 % of predicted.  Assessment and Plan:   1. COPD with asthma (HCC)   2. Other allergic rhinitis   3. LPRD (laryngopharyngeal reflux disease)    1. Continue to Treat inflammation:   A. Asmanex  220 - 1-2 inhalations 1-2 times per day (MAX=4/day)   B. Nasacort  - 1 spray each nostril 3-7 times per week   2. Continue to Treat reflux:   A. Generic Dexilant 60 mg 1 tablet 1 time per day in AM B. Eliminate any caffeine or chocolate consumption    3. If needed:   A.  Nystatin  oral solution-5 mL after Asmanex  - rinse and spit  B.  Albuterol  HFA  C.  Antihistamine  D.  Nasal saline  4. Return to clinic in 12 weeks or earlier if problem.    5. Influenza = Tamiflu. Covid = Paxlovid Eimi Viney appears to be doing pretty well regarding her airway issue and her reflux issue on her current plan of using anti-inflammatory agents for both her upper and lower airway and using a proton pump inhibitor for reflux.  We are not going to change much of her therapy today.  She will continue on the plan noted above and we will see her back in this clinic in 12 weeks or earlier if there is a problem.  I did encourage her to obtain some combination nasal swabs for COVID/flu and she can use Tamiflu if she gets influenza and she can use Paxlovid if she gets COVID.  She has had a problem with Paxlovid in the past although she cannot remember exactly what the problem was so she can use molnupiravir if she is intolerant of using Paxlovid.  Camellia Denis, MD Allergy / Immunology [facility] "

## 2024-02-10 ENCOUNTER — Encounter: Payer: Self-pay | Admitting: Allergy and Immunology

## 2024-05-03 ENCOUNTER — Ambulatory Visit: Admitting: Allergy and Immunology

## 2024-05-24 ENCOUNTER — Ambulatory Visit (HOSPITAL_BASED_OUTPATIENT_CLINIC_OR_DEPARTMENT_OTHER): Admitting: Radiology

## 2024-06-01 ENCOUNTER — Ambulatory Visit: Admitting: Oncology
